# Patient Record
Sex: Female | Born: 1935 | Race: White | Hispanic: No | Smoking: Former smoker
Health system: Southern US, Community
[De-identification: ages and names within clinical notes are randomized; demographics above are authoritative.]

## PROBLEM LIST (undated history)

## (undated) DIAGNOSIS — K219 Gastro-esophageal reflux disease without esophagitis: Secondary | ICD-10-CM

## (undated) DIAGNOSIS — M1990 Unspecified osteoarthritis, unspecified site: Secondary | ICD-10-CM

## (undated) DIAGNOSIS — E7800 Pure hypercholesterolemia, unspecified: Secondary | ICD-10-CM

## (undated) DIAGNOSIS — I10 Essential (primary) hypertension: Secondary | ICD-10-CM

## (undated) DIAGNOSIS — H269 Unspecified cataract: Secondary | ICD-10-CM

## (undated) DIAGNOSIS — E039 Hypothyroidism, unspecified: Secondary | ICD-10-CM

## (undated) HISTORY — PX: COLONOSCOPY W/ POLYPECTOMY: SHX1380

## (undated) HISTORY — DX: Essential (primary) hypertension: I10

## (undated) HISTORY — PX: DILATION AND CURETTAGE OF UTERUS: SHX78

## (undated) HISTORY — PX: TONSILLECTOMY: SUR1361

## (undated) HISTORY — PX: BACK SURGERY: SHX140

---

## 1998-04-19 ENCOUNTER — Other Ambulatory Visit: Admission: RE | Admit: 1998-04-19 | Discharge: 1998-04-19 | Payer: Self-pay | Admitting: Obstetrics and Gynecology

## 1998-06-07 ENCOUNTER — Other Ambulatory Visit: Admission: RE | Admit: 1998-06-07 | Discharge: 1998-06-07 | Payer: Self-pay | Admitting: Obstetrics and Gynecology

## 1998-07-30 ENCOUNTER — Other Ambulatory Visit: Admission: RE | Admit: 1998-07-30 | Discharge: 1998-07-30 | Payer: Self-pay | Admitting: Obstetrics and Gynecology

## 1998-11-28 ENCOUNTER — Other Ambulatory Visit: Admission: RE | Admit: 1998-11-28 | Discharge: 1998-11-28 | Payer: Self-pay | Admitting: Obstetrics and Gynecology

## 1998-12-25 ENCOUNTER — Ambulatory Visit (HOSPITAL_COMMUNITY): Admission: RE | Admit: 1998-12-25 | Discharge: 1998-12-25 | Payer: Self-pay | Admitting: Obstetrics & Gynecology

## 1999-06-20 ENCOUNTER — Other Ambulatory Visit: Admission: RE | Admit: 1999-06-20 | Discharge: 1999-06-20 | Payer: Self-pay | Admitting: Obstetrics and Gynecology

## 1999-12-17 ENCOUNTER — Other Ambulatory Visit: Admission: RE | Admit: 1999-12-17 | Discharge: 1999-12-17 | Payer: Self-pay | Admitting: Obstetrics and Gynecology

## 2000-06-16 ENCOUNTER — Other Ambulatory Visit: Admission: RE | Admit: 2000-06-16 | Discharge: 2000-06-16 | Payer: Self-pay | Admitting: Obstetrics and Gynecology

## 2000-10-21 ENCOUNTER — Encounter (INDEPENDENT_AMBULATORY_CARE_PROVIDER_SITE_OTHER): Payer: Self-pay

## 2000-10-21 ENCOUNTER — Other Ambulatory Visit: Admission: RE | Admit: 2000-10-21 | Discharge: 2000-10-21 | Payer: Self-pay | Admitting: Obstetrics and Gynecology

## 2000-12-02 ENCOUNTER — Encounter (INDEPENDENT_AMBULATORY_CARE_PROVIDER_SITE_OTHER): Payer: Self-pay

## 2000-12-02 ENCOUNTER — Ambulatory Visit (HOSPITAL_COMMUNITY): Admission: RE | Admit: 2000-12-02 | Discharge: 2000-12-02 | Payer: Self-pay | Admitting: Obstetrics and Gynecology

## 2000-12-24 ENCOUNTER — Encounter: Admission: RE | Admit: 2000-12-24 | Discharge: 2000-12-24 | Payer: Self-pay | Admitting: Neurology

## 2000-12-24 ENCOUNTER — Encounter: Payer: Self-pay | Admitting: Neurology

## 2001-01-15 ENCOUNTER — Encounter: Admission: RE | Admit: 2001-01-15 | Discharge: 2001-04-15 | Payer: Self-pay | Admitting: Neurology

## 2001-06-28 ENCOUNTER — Other Ambulatory Visit: Admission: RE | Admit: 2001-06-28 | Discharge: 2001-06-28 | Payer: Self-pay | Admitting: Obstetrics and Gynecology

## 2002-11-28 ENCOUNTER — Other Ambulatory Visit: Admission: RE | Admit: 2002-11-28 | Discharge: 2002-11-28 | Payer: Self-pay | Admitting: Obstetrics and Gynecology

## 2003-03-08 ENCOUNTER — Encounter: Admission: RE | Admit: 2003-03-08 | Discharge: 2003-03-08 | Payer: Self-pay | Admitting: Internal Medicine

## 2003-03-08 ENCOUNTER — Encounter: Payer: Self-pay | Admitting: Internal Medicine

## 2004-01-11 ENCOUNTER — Other Ambulatory Visit: Admission: RE | Admit: 2004-01-11 | Discharge: 2004-01-11 | Payer: Self-pay | Admitting: Obstetrics and Gynecology

## 2006-07-22 ENCOUNTER — Encounter: Admission: RE | Admit: 2006-07-22 | Discharge: 2006-07-22 | Payer: Self-pay | Admitting: Internal Medicine

## 2007-10-21 ENCOUNTER — Ambulatory Visit (HOSPITAL_COMMUNITY): Admission: RE | Admit: 2007-10-21 | Discharge: 2007-10-21 | Payer: Self-pay | Admitting: Internal Medicine

## 2008-01-09 ENCOUNTER — Encounter: Admission: RE | Admit: 2008-01-09 | Discharge: 2008-01-09 | Payer: Self-pay | Admitting: Orthopedic Surgery

## 2009-06-04 ENCOUNTER — Encounter: Admission: RE | Admit: 2009-06-04 | Discharge: 2009-06-04 | Payer: Self-pay | Admitting: Orthopedic Surgery

## 2009-12-23 ENCOUNTER — Encounter: Admission: RE | Admit: 2009-12-23 | Discharge: 2009-12-23 | Payer: Self-pay | Admitting: *Deleted

## 2010-02-20 HISTORY — PX: ANTERIOR CERVICAL DECOMP/DISCECTOMY FUSION: SHX1161

## 2010-02-21 ENCOUNTER — Inpatient Hospital Stay (HOSPITAL_COMMUNITY): Admission: RE | Admit: 2010-02-21 | Discharge: 2010-02-22 | Payer: Self-pay | Admitting: Neurological Surgery

## 2010-04-22 HISTORY — PX: POSTERIOR LUMBAR FUSION: SHX6036

## 2010-05-02 ENCOUNTER — Ambulatory Visit (HOSPITAL_COMMUNITY): Admission: RE | Admit: 2010-05-02 | Discharge: 2010-05-02 | Payer: Self-pay | Admitting: Neurological Surgery

## 2010-05-21 ENCOUNTER — Inpatient Hospital Stay (HOSPITAL_COMMUNITY)
Admission: RE | Admit: 2010-05-21 | Discharge: 2010-05-28 | Payer: Self-pay | Source: Home / Self Care | Admitting: Neurological Surgery

## 2010-12-06 LAB — BASIC METABOLIC PANEL
BUN: 10 mg/dL (ref 6–23)
Calcium: 10.2 mg/dL (ref 8.4–10.5)
Calcium: 8.3 mg/dL — ABNORMAL LOW (ref 8.4–10.5)
Creatinine, Ser: 0.6 mg/dL (ref 0.4–1.2)
GFR calc Af Amer: >60 mL/min (ref >60)
GFR calc Af Amer: >60 mL/min (ref >60)
Glucose, Bld: 140 mg/dL — ABNORMAL HIGH (ref 70–99)
Potassium: 4.2 mEq/L (ref 3.5–5.1)
Sodium: 132 mEq/L — ABNORMAL LOW (ref 135–145)
Sodium: 135 mEq/L (ref 135–145)

## 2010-12-06 LAB — CBC
HCT: 40.1 % (ref 36.0–46.0)
Hemoglobin: 14.1 g/dL (ref 12.0–15.0)
MCH: 30.8 pg (ref 26.0–34.0)
MCHC: 35.2 g/dL (ref 30.0–36.0)
MCV: 87.4 fL (ref 78.0–100.0)
Platelets: 156 10*3/uL (ref 150–400)
Platelets: 203 10*3/uL (ref 150–400)
RDW: 12.4 % (ref 11.5–15.5)
RDW: 12.8 % (ref 11.5–15.5)

## 2010-12-06 LAB — TYPE AND SCREEN: ABO/RH(D): B POS

## 2010-12-06 LAB — VITAMIN D 25 HYDROXY (VIT D DEFICIENCY, FRACTURES): Vit D, 25-Hydroxy: 39 ng/mL (ref 30–89)

## 2010-12-06 LAB — ABO/RH: ABO/RH(D): B POS

## 2010-12-09 LAB — CBC
Hemoglobin: 13.9 g/dL (ref 12.0–15.0)
MCHC: 35.6 g/dL (ref 30.0–36.0)
RBC: 4.33 MIL/uL (ref 3.87–5.11)
WBC: 4.8 10*3/uL (ref 4.0–10.5)

## 2010-12-09 LAB — SURGICAL PCR SCREEN: MRSA, PCR: NEGATIVE

## 2011-02-06 ENCOUNTER — Other Ambulatory Visit (HOSPITAL_COMMUNITY): Payer: Self-pay | Admitting: Neurological Surgery

## 2011-02-06 DIAGNOSIS — M542 Cervicalgia: Secondary | ICD-10-CM

## 2011-02-06 DIAGNOSIS — M549 Dorsalgia, unspecified: Secondary | ICD-10-CM

## 2011-02-06 DIAGNOSIS — M546 Pain in thoracic spine: Secondary | ICD-10-CM

## 2011-02-07 NOTE — Op Note (Signed)
[facility]  Patient:    Carol Woods, Carol Woods                      MRN: [id number] Proc. Date: 12/02/00 Adm. Date:  [id number] Attending:  Morene Antu                           Operative Report  PREOPERATIVE DIAGNOSES:       1. Postmenopausal bleeding.                               2. Thickened endometrium.  POSTOPERATIVE DIAGNOSES:      1. Postmenopausal bleeding.                               2. Thickened endometrium.                               3. Submucosal fibroid.  PROCEDURE:                    Dilation, curettage and hysteroscopy with resectoscopic excision of submucosal fibroids.  SURGEON:                      Sherry A. Rosalio Macadamia, M.D.  ANESTHESIA:                   MAC.  INDICATIONS:                  This is a 77 year old G3, P2-0-1-2-1 who has been having postmenopausal bleeding daily. since changing her hormone replacement therapy.  After trying several different kinds of hormone replacement without success in stopping her bleeding, ultrasound was performed.  Ultrasound revealed small fibroids plus and thickened endometrium with a hyperechoic mass.  Vaginal mass was present measuring 2.4 x 2.0 x 2.0 cm, which has been followed for many years, consistent with an endometrioma.  because of the presence of the thickened endometrium, the patient is brought to the operating room for [facility] hysteroscopy and resectoscopic excision.  FINDINGS:                     Normal sized, anteflexed uterus.  submucosal fibroid present in the right cornua, approximately 1 cm, and an anterior submucosal fibroid present near the endocervical junction.  DESCRIPTION OF PROCEDURE:     The patient was brought into the operating room and given adequate IV sedation.  She was placed in the dorsal lithotomy position.  The perineum was washed with Betadine.  Pelvic examination was performed.  The bladder was in-and-out catheterized.  The surgeons gown  and gloves were changed.  The patient was draped in a sterile fashion.  A speculum was placed within the vagina.  The vagina was washed with Betadine. Paracervical block was administered with 1% Nesacaine.  The anterior lip of the cervix was grasped with a single tooth tenaculum.  The cervix was sounded. The cervix was dilated with Pratt dilators to a #31.  The hysteroscope was easily introduced into the endometrial cavity.  Pictures were obtained.  The submucosal fibroid was visualized at the right cornua.  Another submucosal fibroid area was present anteriorly  near the endocervical lower uterine segment junction.  Using a double loop  right angle resector, the submucosal fibroids were first excised and samples of the endometrium were taken circumferentially.  Adequate hemostasis was present.  Pictures were obtained. The hysteroscope was removed from the vagina.  The tenaculum was removed from the cervix.  The speculum was removed.  Repeat pelvic exam was performed, paying attention to the left adnexal region with the above findings.  The patient was taken out of the dorsal lithotomy position.  She was awakened. She was moved from the operating table to a stretcher in stable condition.  COMPLICATIONS:                None.  ESTIMATED BLOOD LOSS:         Less than 5 cc.  SORBITOL DIFFERENTIAL:        -80 cc. DD:  12/02/00 TD:  12/02/00 Job: [id number]

## 2011-02-21 ENCOUNTER — Ambulatory Visit (HOSPITAL_COMMUNITY)
Admission: RE | Admit: 2011-02-21 | Discharge: 2011-02-21 | Disposition: A | Discharge status: Home / Self Care | Payer: Medicare Other | Source: Ambulatory Visit | Attending: Neurological Surgery | Admitting: Neurological Surgery

## 2011-02-21 ENCOUNTER — Ambulatory Visit (HOSPITAL_COMMUNITY): Admission: RE | Admit: 2011-02-21 | Payer: Medicare Other | Source: Ambulatory Visit

## 2011-02-21 ENCOUNTER — Other Ambulatory Visit (HOSPITAL_COMMUNITY): Payer: Self-pay

## 2011-02-21 ENCOUNTER — Ambulatory Visit (HOSPITAL_COMMUNITY): Admission: RE | Admit: 2011-02-21 | Payer: Self-pay | Source: Ambulatory Visit

## 2011-02-21 ENCOUNTER — Other Ambulatory Visit (HOSPITAL_COMMUNITY): Payer: Self-pay | Admitting: Neurological Surgery

## 2011-02-21 DIAGNOSIS — M542 Cervicalgia: Secondary | ICD-10-CM

## 2011-02-21 DIAGNOSIS — M48061 Spinal stenosis, lumbar region without neurogenic claudication: Secondary | ICD-10-CM | POA: Insufficient documentation

## 2011-02-21 DIAGNOSIS — M549 Dorsalgia, unspecified: Secondary | ICD-10-CM

## 2011-02-21 DIAGNOSIS — M5144 Schmorl's nodes, thoracic region: Secondary | ICD-10-CM | POA: Insufficient documentation

## 2011-02-21 DIAGNOSIS — M5124 Other intervertebral disc displacement, thoracic region: Secondary | ICD-10-CM | POA: Insufficient documentation

## 2011-02-21 DIAGNOSIS — M47812 Spondylosis without myelopathy or radiculopathy, cervical region: Secondary | ICD-10-CM | POA: Insufficient documentation

## 2011-02-21 DIAGNOSIS — M546 Pain in thoracic spine: Secondary | ICD-10-CM

## 2011-02-21 MED ORDER — IOHEXOL 300 MG/ML  SOLN
10.0000 mL | Freq: Once | INTRAMUSCULAR | Status: AC | PRN
  Administered 2011-02-21: 10 mL via INTRATHECAL

## 2011-05-19 ENCOUNTER — Inpatient Hospital Stay (HOSPITAL_COMMUNITY): Admission: RE | Admit: 2011-05-19 | Payer: Medicare Other | Source: Ambulatory Visit | Admitting: Neurological Surgery

## 2014-05-24 ENCOUNTER — Other Ambulatory Visit: Payer: Self-pay | Admitting: Orthopedic Surgery

## 2014-06-08 ENCOUNTER — Inpatient Hospital Stay (HOSPITAL_COMMUNITY)
Admission: RE | Admit: 2014-06-08 | Payer: BC Managed Care – PPO | Source: Ambulatory Visit | Admitting: Orthopedic Surgery

## 2014-06-08 ENCOUNTER — Encounter (HOSPITAL_COMMUNITY): Admission: RE | Payer: Self-pay | Source: Ambulatory Visit

## 2014-06-08 SURGERY — ARTHROPLASTY, SHOULDER, TOTAL
Anesthesia: Choice | Laterality: Right

## 2014-08-16 ENCOUNTER — Other Ambulatory Visit: Payer: Self-pay | Admitting: Orthopedic Surgery

## 2014-08-21 NOTE — Pre-Procedure Instructions (Signed)
YAMILEX BORGWARDT  08/21/2014   Your procedure is scheduled on:  Thurs, Dec 10 @ 10:50 AM  Report to [facility]  at 8:50 AM.  Call this number if you have problems the morning of surgery: [phone]   Remember:   Do not eat food or drink liquids after midnight.   Take these medicines the morning of surgery with A SIP OF WATER: Synthroid                No Goody's,BC's,Aleve,Aspirin,Ibuprofen,Fish Oil,or any Herbal Medications   Do not wear jewelry, make-up or nail polish.  Do not wear lotions, powders, or perfumes.   Do not shave 48 hours prior to surgery.   Do not bring valuables to the hospital.  [facility] is not responsible  for any belongings or valuables.               Contacts, dentures or bridgework may not be worn into surgery.  Leave suitcase in the car. After surgery it may be brought to your room.  For patients admitted to the hospital, discharge time is determined by your   treatment team.               Patients discharged the day of surgery will not be allowed to drive home.    Special Instructions:  [place] - Preparing for Surgery  Before surgery, you can play an important role.  Because skin is not sterile, your skin needs to be as free of germs as possible.  You can reduce the number of germs on you skin by washing with CHG (chlorahexidine gluconate) soap before surgery.  CHG is an antiseptic cleaner which kills germs and bonds with the skin to continue killing germs even after washing.  Please DO NOT use if you have an allergy to CHG or antibacterial soaps.  If your skin becomes reddened/irritated stop using the CHG and inform your nurse when you arrive at Short Stay.  Do not shave (including legs and underarms) for at least 48 hours prior to the first CHG shower.  You may shave your face.  Please follow these instructions carefully:   1.  Shower with CHG Soap the night before surgery and the                                morning of  Surgery.  2.  If you choose to wash your hair, wash your hair first as usual with your       normal shampoo.  3.  After you shampoo, rinse your hair and body thoroughly to remove the                      Shampoo.  4.  Use CHG as you would any other liquid soap.  You can apply chg directly       to the skin and wash gently with scrungie or a clean washcloth.  5.  Apply the CHG Soap to your body ONLY FROM THE NECK DOWN.        Do not use on open wounds or open sores.  Avoid contact with your eyes,       ears, mouth and genitals (private parts).  Wash genitals (private parts)       with your normal soap.  6.  Wash thoroughly, paying special attention to the area where your surgery  will be performed.  7.  Thoroughly rinse your body with warm water from the neck down.  8.  DO NOT shower/wash with your normal soap after using and rinsing off       the CHG Soap.  9.  Pat yourself dry with a clean towel.            10.  Wear clean pajamas.            11.  Place clean sheets on your bed the night of your first shower and do not        sleep with pets.  Day of Surgery  Do not apply any lotions/deoderants the morning of surgery.  Please wear clean clothes to the hospital/surgery center.     Please read over the following fact sheets that you were given: Pain Booklet, Coughing and Deep Breathing, MRSA Information and Surgical Site Infection Prevention

## 2014-08-22 ENCOUNTER — Encounter (HOSPITAL_COMMUNITY): Payer: Self-pay

## 2014-08-22 ENCOUNTER — Encounter (HOSPITAL_COMMUNITY)
Admission: RE | Admit: 2014-08-22 | Discharge: 2014-08-22 | Disposition: A | Discharge status: Home / Self Care | Payer: Medicare Other | Source: Ambulatory Visit | Attending: Orthopedic Surgery | Admitting: Orthopedic Surgery

## 2014-08-22 DIAGNOSIS — Z01818 Encounter for other preprocedural examination: Secondary | ICD-10-CM | POA: Insufficient documentation

## 2014-08-22 DIAGNOSIS — I444 Left anterior fascicular block: Secondary | ICD-10-CM | POA: Diagnosis not present

## 2014-08-22 DIAGNOSIS — I10 Essential (primary) hypertension: Secondary | ICD-10-CM | POA: Diagnosis not present

## 2014-08-22 DIAGNOSIS — I4510 Unspecified right bundle-branch block: Secondary | ICD-10-CM | POA: Diagnosis not present

## 2014-08-22 DIAGNOSIS — E039 Hypothyroidism, unspecified: Secondary | ICD-10-CM | POA: Insufficient documentation

## 2014-08-22 DIAGNOSIS — I452 Bifascicular block: Secondary | ICD-10-CM | POA: Insufficient documentation

## 2014-08-22 DIAGNOSIS — I517 Cardiomegaly: Secondary | ICD-10-CM | POA: Insufficient documentation

## 2014-08-22 DIAGNOSIS — K219 Gastro-esophageal reflux disease without esophagitis: Secondary | ICD-10-CM | POA: Diagnosis not present

## 2014-08-22 HISTORY — DX: Essential (primary) hypertension: I10

## 2014-08-22 HISTORY — DX: Unspecified cataract: H26.9

## 2014-08-22 HISTORY — DX: Hypothyroidism, unspecified: E03.9

## 2014-08-22 HISTORY — DX: Unspecified osteoarthritis, unspecified site: M19.90

## 2014-08-22 HISTORY — DX: Gastro-esophageal reflux disease without esophagitis: K21.9

## 2014-08-22 LAB — TYPE AND SCREEN
ABO/RH(D): B POS
Antibody Screen: NEGATIVE

## 2014-08-22 LAB — URINALYSIS, ROUTINE W REFLEX MICROSCOPIC
BILIRUBIN URINE: NEGATIVE
GLUCOSE, UA: NEGATIVE mg/dL
Hgb urine dipstick: NEGATIVE
Ketones, ur: NEGATIVE mg/dL
NITRITE: NEGATIVE
PH: 6 (ref 5.0–8.0)
Protein, ur: NEGATIVE mg/dL
SPECIFIC GRAVITY, URINE: 1.021 (ref 1.005–1.030)
Urobilinogen, UA: 0.2 mg/dL (ref 0.0–1.0)

## 2014-08-22 LAB — CBC WITH DIFFERENTIAL/PLATELET
Basophils Absolute: 0 10*3/uL (ref 0.0–0.1)
Basophils Relative: 1 % (ref 0–1)
Eosinophils Absolute: 0.1 10*3/uL (ref 0.0–0.7)
Eosinophils Relative: 2 % (ref 0–5)
HEMATOCRIT: 39.2 % (ref 36.0–46.0)
HEMOGLOBIN: 13.4 g/dL (ref 12.0–15.0)
LYMPHS ABS: 1.5 10*3/uL (ref 0.7–4.0)
LYMPHS PCT: 31 % (ref 12–46)
MCH: 30.1 pg (ref 26.0–34.0)
MCHC: 34.2 g/dL (ref 30.0–36.0)
MCV: 88.1 fL (ref 78.0–100.0)
MONOS PCT: 9 % (ref 3–12)
Monocytes Absolute: 0.4 10*3/uL (ref 0.1–1.0)
NEUTROS ABS: 2.9 10*3/uL (ref 1.7–7.7)
Neutrophils Relative %: 57 % (ref 43–77)
Platelets: 205 10*3/uL (ref 150–400)
RBC: 4.45 MIL/uL (ref 3.87–5.11)
RDW: 12.5 % (ref 11.5–15.5)
WBC: 4.9 10*3/uL (ref 4.0–10.5)

## 2014-08-22 LAB — COMPREHENSIVE METABOLIC PANEL
ALT: 20 U/L (ref 0–35)
ANION GAP: 12 (ref 5–15)
AST: 18 U/L (ref 0–37)
Albumin: 3.9 g/dL (ref 3.5–5.2)
Alkaline Phosphatase: 60 U/L (ref 39–117)
BILIRUBIN TOTAL: 0.4 mg/dL (ref 0.3–1.2)
BUN: 15 mg/dL (ref 6–23)
CHLORIDE: 101 meq/L (ref 96–112)
CO2: 25 meq/L (ref 19–32)
CREATININE: 0.57 mg/dL (ref 0.50–1.10)
Calcium: 9.5 mg/dL (ref 8.4–10.5)
GFR, EST NON AFRICAN AMERICAN: 87 mL/min — AB (ref >90)
GLUCOSE: 106 mg/dL — AB (ref 70–99)
Potassium: 4.2 mEq/L (ref 3.7–5.3)
Sodium: 138 mEq/L (ref 137–147)
Total Protein: 7.4 g/dL (ref 6.0–8.3)

## 2014-08-22 LAB — PROTIME-INR
INR: 1.04 (ref 0.00–1.49)
PROTHROMBIN TIME: 13.7 s (ref 11.6–15.2)

## 2014-08-22 LAB — SURGICAL PCR SCREEN
MRSA, PCR: NEGATIVE
Staphylococcus aureus: NEGATIVE

## 2014-08-22 LAB — URINE MICROSCOPIC-ADD ON

## 2014-08-22 LAB — APTT: APTT: 32 s (ref 24–37)

## 2014-08-22 NOTE — Progress Notes (Signed)
Talked to Dr Keane Police nurse about abnormal EKG done this AM at patient's PAT appointment for surgery. Requested a comparison EKG , CXR, and last office visit notes for anesthesia review. Faxed EKG done today ,   nurse stated she would show EKG to Dr Virgina Jock, EKG- NSR possible left atrial enlargement, RBBB,left anterior fascicular, Bifascicular block, left ventricular hypertrophy, cannot rule out septal infart.Nurse also stated they would call patient.

## 2014-08-22 NOTE — Progress Notes (Signed)
Anesthesia Chart Review:  Pt is 80 year old female scheduled for R total shoulder arthroplasty on 08/31/2014 with Dr. Tamera Punt.   PMH: HTN, hypothyroidism, GERD  Medications include: ASA, lisinopril, levothyroxine, brimonidine  Preoperative labs reviewed.    EKG: NSR. Possible L atrial enlargement. RBBB. LAFB. LVH. Cannot rule out septal infarct, age undetermined. Since last EKG 2011, Q waves V1, V2.   Nuclear stress test 11/25/2007: Normal stress test.   Per last office visit note from Dr. Virgina Jock, pcp, he is aware of surgery.   Discussed case with Dr. Linna Caprice.   If no changes, I anticipate pt can proceed with surgery as scheduled.   Willeen Cass, FNP-BC [facility] Short Stay Surgical Center/Anesthesiology Phone: [phone] 08/22/2014 4:50 PM

## 2014-08-28 ENCOUNTER — Other Ambulatory Visit (HOSPITAL_COMMUNITY): Payer: BC Managed Care – PPO

## 2014-08-30 MED ORDER — CEFAZOLIN SODIUM-DEXTROSE 2-3 GM-% IV SOLR
2.0000 g | INTRAVENOUS | Status: AC
  Administered 2014-08-31: 2 g via INTRAVENOUS
  Filled 2014-08-30: qty 50

## 2014-08-30 MED ORDER — POVIDONE-IODINE 7.5 % EX SOLN
Freq: Once | CUTANEOUS | Status: DC
  Filled 2014-08-30: qty 118

## 2014-08-31 ENCOUNTER — Inpatient Hospital Stay (HOSPITAL_COMMUNITY)
Admission: RE | Admit: 2014-08-31 | Discharge: 2014-09-01 | DRG: 483 | Disposition: A | Discharge status: Home Health | Payer: Medicare Other | Source: Ambulatory Visit | Attending: Orthopedic Surgery | Admitting: Orthopedic Surgery

## 2014-08-31 ENCOUNTER — Inpatient Hospital Stay (HOSPITAL_COMMUNITY): Payer: Medicare Other | Admitting: Certified Registered Nurse Anesthetist

## 2014-08-31 ENCOUNTER — Inpatient Hospital Stay (HOSPITAL_COMMUNITY): Payer: Medicare Other

## 2014-08-31 ENCOUNTER — Encounter (HOSPITAL_COMMUNITY)
Admission: RE | Disposition: A | Discharge status: Home Health | Payer: Self-pay | Source: Ambulatory Visit | Attending: Orthopedic Surgery

## 2014-08-31 ENCOUNTER — Encounter (HOSPITAL_COMMUNITY): Payer: Self-pay | Admitting: Certified Registered Nurse Anesthetist

## 2014-08-31 ENCOUNTER — Inpatient Hospital Stay (HOSPITAL_COMMUNITY): Payer: Medicare Other | Admitting: Emergency Medicine

## 2014-08-31 DIAGNOSIS — I10 Essential (primary) hypertension: Secondary | ICD-10-CM | POA: Diagnosis present

## 2014-08-31 DIAGNOSIS — M19011 Primary osteoarthritis, right shoulder: Secondary | ICD-10-CM | POA: Diagnosis present

## 2014-08-31 DIAGNOSIS — E039 Hypothyroidism, unspecified: Secondary | ICD-10-CM | POA: Diagnosis present

## 2014-08-31 DIAGNOSIS — Z01818 Encounter for other preprocedural examination: Secondary | ICD-10-CM

## 2014-08-31 DIAGNOSIS — K219 Gastro-esophageal reflux disease without esophagitis: Secondary | ICD-10-CM | POA: Diagnosis present

## 2014-08-31 DIAGNOSIS — Z87891 Personal history of nicotine dependence: Secondary | ICD-10-CM

## 2014-08-31 DIAGNOSIS — Z79899 Other long term (current) drug therapy: Secondary | ICD-10-CM

## 2014-08-31 DIAGNOSIS — D62 Acute posthemorrhagic anemia: Secondary | ICD-10-CM | POA: Diagnosis not present

## 2014-08-31 DIAGNOSIS — Z96611 Presence of right artificial shoulder joint: Secondary | ICD-10-CM

## 2014-08-31 DIAGNOSIS — M25511 Pain in right shoulder: Secondary | ICD-10-CM | POA: Diagnosis present

## 2014-08-31 DIAGNOSIS — Z7982 Long term (current) use of aspirin: Secondary | ICD-10-CM

## 2014-08-31 DIAGNOSIS — E780 Pure hypercholesterolemia: Secondary | ICD-10-CM | POA: Diagnosis present

## 2014-08-31 DIAGNOSIS — Z96619 Presence of unspecified artificial shoulder joint: Secondary | ICD-10-CM

## 2014-08-31 HISTORY — PX: TOTAL SHOULDER ARTHROPLASTY: SHX126

## 2014-08-31 HISTORY — DX: Pure hypercholesterolemia, unspecified: E78.00

## 2014-08-31 SURGERY — ARTHROPLASTY, SHOULDER, TOTAL
Anesthesia: Regional | Site: Shoulder | Laterality: Right

## 2014-08-31 MED ORDER — GLYCOPYRROLATE 0.2 MG/ML IJ SOLN
INTRAMUSCULAR | Status: DC | PRN
  Administered 2014-08-31: 0.4 mg via INTRAVENOUS

## 2014-08-31 MED ORDER — ONDANSETRON HCL 4 MG/2ML IJ SOLN
INTRAMUSCULAR | Status: DC | PRN
  Administered 2014-08-31: 4 mg via INTRAVENOUS

## 2014-08-31 MED ORDER — PROPOFOL 10 MG/ML IV BOLUS
INTRAVENOUS | Status: AC
  Filled 2014-08-31: qty 20

## 2014-08-31 MED ORDER — OXYCODONE-ACETAMINOPHEN 5-325 MG PO TABS
1.0000 | ORAL_TABLET | ORAL | Status: DC | PRN
  Administered 2014-09-01: 2 via ORAL
  Filled 2014-08-31: qty 2

## 2014-08-31 MED ORDER — FENTANYL CITRATE 0.05 MG/ML IJ SOLN
INTRAMUSCULAR | Status: AC
  Filled 2014-08-31: qty 2

## 2014-08-31 MED ORDER — DIPHENHYDRAMINE HCL 12.5 MG/5ML PO ELIX: 12.5000 mg | ORAL_SOLUTION | ORAL | Status: DC | PRN

## 2014-08-31 MED ORDER — LISINOPRIL 20 MG PO TABS
20.0000 mg | ORAL_TABLET | Freq: Every day | ORAL | Status: DC
  Administered 2014-08-31 – 2014-09-01 (×2): 20 mg via ORAL
  Filled 2014-08-31 (×2): qty 1

## 2014-08-31 MED ORDER — FENTANYL CITRATE 0.05 MG/ML IJ SOLN
INTRAMUSCULAR | Status: AC
  Administered 2014-08-31: 50 ug via INTRAVENOUS
  Filled 2014-08-31: qty 2

## 2014-08-31 MED ORDER — NEOSTIGMINE METHYLSULFATE 10 MG/10ML IV SOLN
INTRAVENOUS | Status: AC
  Filled 2014-08-31: qty 1

## 2014-08-31 MED ORDER — ALUMINUM HYDROXIDE GEL 320 MG/5ML PO SUSP
15.0000 mL | ORAL | Status: DC | PRN
  Filled 2014-08-31: qty 30

## 2014-08-31 MED ORDER — ONDANSETRON HCL 4 MG/2ML IJ SOLN
INTRAMUSCULAR | Status: AC
  Filled 2014-08-31: qty 2

## 2014-08-31 MED ORDER — ONDANSETRON HCL 4 MG/2ML IJ SOLN: 4.0000 mg | Freq: Once | INTRAMUSCULAR | Status: DC | PRN

## 2014-08-31 MED ORDER — MIDAZOLAM HCL 2 MG/2ML IJ SOLN
INTRAMUSCULAR | Status: AC
  Administered 2014-08-31: 2 mg via INTRAVENOUS
  Filled 2014-08-31: qty 2

## 2014-08-31 MED ORDER — ACETAMINOPHEN 650 MG RE SUPP: 650.0000 mg | Freq: Four times a day (QID) | RECTAL | Status: DC | PRN

## 2014-08-31 MED ORDER — EPHEDRINE SULFATE 50 MG/ML IJ SOLN
INTRAMUSCULAR | Status: DC | PRN
  Administered 2014-08-31 (×2): 5 mg via INTRAVENOUS

## 2014-08-31 MED ORDER — BRIMONIDINE TARTRATE 0.2 % OP SOLN
1.0000 [drp] | Freq: Two times a day (BID) | OPHTHALMIC | Status: DC
  Administered 2014-08-31 – 2014-09-01 (×2): 1 [drp] via OPHTHALMIC
  Filled 2014-08-31: qty 5

## 2014-08-31 MED ORDER — OXYCODONE HCL 5 MG/5ML PO SOLN: 5.0000 mg | Freq: Once | ORAL | Status: DC | PRN

## 2014-08-31 MED ORDER — MENTHOL 3 MG MT LOZG: 1.0000 | LOZENGE | OROMUCOSAL | Status: DC | PRN

## 2014-08-31 MED ORDER — LACTATED RINGERS IV SOLN
INTRAVENOUS | Status: DC | PRN
  Administered 2014-08-31 (×2): via INTRAVENOUS

## 2014-08-31 MED ORDER — DOCUSATE SODIUM 100 MG PO CAPS
100.0000 mg | ORAL_CAPSULE | Freq: Two times a day (BID) | ORAL | Status: DC
  Administered 2014-08-31 – 2014-09-01 (×2): 100 mg via ORAL
  Filled 2014-08-31 (×2): qty 1

## 2014-08-31 MED ORDER — ROCURONIUM BROMIDE 100 MG/10ML IV SOLN
INTRAVENOUS | Status: DC | PRN
  Administered 2014-08-31: 50 mg via INTRAVENOUS

## 2014-08-31 MED ORDER — BISACODYL 10 MG RE SUPP: 10.0000 mg | Freq: Every day | RECTAL | Status: DC | PRN

## 2014-08-31 MED ORDER — PHENOL 1.4 % MT LIQD: 1.0000 | OROMUCOSAL | Status: DC | PRN

## 2014-08-31 MED ORDER — ARTIFICIAL TEARS OP OINT
TOPICAL_OINTMENT | OPHTHALMIC | Status: AC
  Filled 2014-08-31: qty 3.5

## 2014-08-31 MED ORDER — ASPIRIN EC 325 MG PO TBEC
325.0000 mg | DELAYED_RELEASE_TABLET | Freq: Two times a day (BID) | ORAL | Status: DC
  Administered 2014-08-31 – 2014-09-01 (×2): 325 mg via ORAL
  Filled 2014-08-31 (×5): qty 1

## 2014-08-31 MED ORDER — ONDANSETRON HCL 4 MG/2ML IJ SOLN: 4.0000 mg | Freq: Four times a day (QID) | INTRAMUSCULAR | Status: DC | PRN

## 2014-08-31 MED ORDER — FENTANYL CITRATE 0.05 MG/ML IJ SOLN
50.0000 ug | INTRAMUSCULAR | Status: DC | PRN
  Administered 2014-08-31: 50 ug via INTRAVENOUS

## 2014-08-31 MED ORDER — HYDROCODONE-ACETAMINOPHEN 5-325 MG PO TABS
1.0000 | ORAL_TABLET | ORAL | Status: DC | PRN
  Administered 2014-09-01: 2 via ORAL
  Filled 2014-08-31: qty 2

## 2014-08-31 MED ORDER — SODIUM CHLORIDE 0.9 % IJ SOLN
INTRAMUSCULAR | Status: DC | PRN
  Administered 2014-08-31: 20 mL

## 2014-08-31 MED ORDER — CEFAZOLIN SODIUM 1-5 GM-% IV SOLN
1.0000 g | Freq: Four times a day (QID) | INTRAVENOUS | Status: AC
  Administered 2014-08-31 – 2014-09-01 (×3): 1 g via INTRAVENOUS
  Filled 2014-08-31 (×3): qty 50

## 2014-08-31 MED ORDER — METOCLOPRAMIDE HCL 5 MG/ML IJ SOLN: 5.0000 mg | Freq: Three times a day (TID) | INTRAMUSCULAR | Status: DC | PRN

## 2014-08-31 MED ORDER — SODIUM CHLORIDE 0.9 % IR SOLN
Status: DC | PRN
  Administered 2014-08-31: 1000 mL

## 2014-08-31 MED ORDER — DEXAMETHASONE SODIUM PHOSPHATE 4 MG/ML IJ SOLN
INTRAMUSCULAR | Status: DC | PRN
  Administered 2014-08-31: 8 mg via INTRAVENOUS

## 2014-08-31 MED ORDER — HYDROMORPHONE HCL 1 MG/ML IJ SOLN: 0.2500 mg | INTRAMUSCULAR | Status: DC | PRN

## 2014-08-31 MED ORDER — MIDAZOLAM HCL 2 MG/2ML IJ SOLN
1.0000 mg | INTRAMUSCULAR | Status: DC | PRN
  Administered 2014-08-31: 2 mg via INTRAVENOUS

## 2014-08-31 MED ORDER — LEVOTHYROXINE SODIUM 125 MCG PO TABS
125.0000 ug | ORAL_TABLET | Freq: Every day | ORAL | Status: DC
  Administered 2014-09-01: 125 ug via ORAL
  Filled 2014-08-31 (×2): qty 1

## 2014-08-31 MED ORDER — MIDAZOLAM HCL 2 MG/2ML IJ SOLN
INTRAMUSCULAR | Status: AC
  Filled 2014-08-31: qty 2

## 2014-08-31 MED ORDER — METRONIDAZOLE 0.75 % EX GEL
1.0000 "application " | Freq: Every day | CUTANEOUS | Status: DC
  Administered 2014-08-31 – 2014-09-01 (×2): 1 via TOPICAL
  Filled 2014-08-31: qty 45

## 2014-08-31 MED ORDER — OXYCODONE HCL 5 MG PO TABS: 5.0000 mg | ORAL_TABLET | ORAL | Status: DC | PRN

## 2014-08-31 MED ORDER — PHENYLEPHRINE HCL 10 MG/ML IJ SOLN
10.0000 mg | INTRAVENOUS | Status: DC | PRN
  Administered 2014-08-31: 20 ug/min via INTRAVENOUS

## 2014-08-31 MED ORDER — PROPOFOL 10 MG/ML IV BOLUS
INTRAVENOUS | Status: DC | PRN
  Administered 2014-08-31: 120 mg via INTRAVENOUS

## 2014-08-31 MED ORDER — SODIUM CHLORIDE 0.9 % IV SOLN
INTRAVENOUS | Status: DC
  Administered 2014-08-31: 23:00:00 via INTRAVENOUS

## 2014-08-31 MED ORDER — FLEET ENEMA 7-19 GM/118ML RE ENEM: 1.0000 | ENEMA | Freq: Once | RECTAL | Status: AC | PRN

## 2014-08-31 MED ORDER — ARTIFICIAL TEARS OP OINT
TOPICAL_OINTMENT | OPHTHALMIC | Status: DC | PRN
  Administered 2014-08-31: 1 via OPHTHALMIC

## 2014-08-31 MED ORDER — BUPIVACAINE LIPOSOME 1.3 % IJ SUSP
20.0000 mL | Freq: Once | INTRAMUSCULAR | Status: AC
  Administered 2014-08-31: 20 mL
  Filled 2014-08-31: qty 20

## 2014-08-31 MED ORDER — GLYCOPYRROLATE 0.2 MG/ML IJ SOLN
INTRAMUSCULAR | Status: AC
  Filled 2014-08-31: qty 2

## 2014-08-31 MED ORDER — MORPHINE SULFATE 2 MG/ML IJ SOLN: 1.0000 mg | INTRAMUSCULAR | Status: DC | PRN

## 2014-08-31 MED ORDER — ROCURONIUM BROMIDE 50 MG/5ML IV SOLN
INTRAVENOUS | Status: AC
  Filled 2014-08-31: qty 1

## 2014-08-31 MED ORDER — POLYETHYLENE GLYCOL 3350 17 G PO PACK: 17.0000 g | PACK | Freq: Every day | ORAL | Status: DC | PRN

## 2014-08-31 MED ORDER — OXYCODONE-ACETAMINOPHEN 5-325 MG PO TABS: 1.0000 | ORAL_TABLET | ORAL | Status: DC | PRN

## 2014-08-31 MED ORDER — METOCLOPRAMIDE HCL 10 MG PO TABS: 5.0000 mg | ORAL_TABLET | Freq: Three times a day (TID) | ORAL | Status: DC | PRN

## 2014-08-31 MED ORDER — LIDOCAINE HCL (CARDIAC) 20 MG/ML IV SOLN
INTRAVENOUS | Status: DC | PRN
  Administered 2014-08-31: 100 mg via INTRAVENOUS

## 2014-08-31 MED ORDER — FENTANYL CITRATE 0.05 MG/ML IJ SOLN
INTRAMUSCULAR | Status: AC
Start: 2014-08-31 — End: 2014-08-31
  Filled 2014-08-31: qty 5

## 2014-08-31 MED ORDER — DEXAMETHASONE SODIUM PHOSPHATE 4 MG/ML IJ SOLN
INTRAMUSCULAR | Status: AC
  Filled 2014-08-31: qty 2

## 2014-08-31 MED ORDER — LIDOCAINE HCL (CARDIAC) 20 MG/ML IV SOLN
INTRAVENOUS | Status: AC
  Filled 2014-08-31: qty 5

## 2014-08-31 MED ORDER — MEPERIDINE HCL 25 MG/ML IJ SOLN: 6.2500 mg | INTRAMUSCULAR | Status: DC | PRN

## 2014-08-31 MED ORDER — OXYCODONE HCL 5 MG PO TABS: 5.0000 mg | ORAL_TABLET | Freq: Once | ORAL | Status: DC | PRN

## 2014-08-31 MED ORDER — LACTATED RINGERS IV SOLN
INTRAVENOUS | Status: DC
  Administered 2014-08-31: 10:00:00 via INTRAVENOUS

## 2014-08-31 MED ORDER — ONDANSETRON HCL 4 MG PO TABS: 4.0000 mg | ORAL_TABLET | Freq: Four times a day (QID) | ORAL | Status: DC | PRN

## 2014-08-31 MED ORDER — NEOSTIGMINE METHYLSULFATE 10 MG/10ML IV SOLN
INTRAVENOUS | Status: DC | PRN
  Administered 2014-08-31: 3 mg via INTRAVENOUS

## 2014-08-31 MED ORDER — FENTANYL CITRATE 0.05 MG/ML IJ SOLN
INTRAMUSCULAR | Status: DC | PRN
  Administered 2014-08-31: 100 ug via INTRAVENOUS

## 2014-08-31 MED ORDER — DOCUSATE SODIUM 100 MG PO CAPS: 100.0000 mg | ORAL_CAPSULE | Freq: Three times a day (TID) | ORAL | Status: DC | PRN

## 2014-08-31 MED ORDER — ACETAMINOPHEN 325 MG PO TABS: 650.0000 mg | ORAL_TABLET | Freq: Four times a day (QID) | ORAL | Status: DC | PRN

## 2014-08-31 SURGICAL SUPPLY — 69 items
BIT DRILL 5/64X5 DISP (BIT) ×1 IMPLANT
BLADE SAW SAG 73X25 THK (BLADE) ×1
BLADE SAW SGTL 73X25 THK (BLADE) ×1 IMPLANT
BLADE SURG 15 STRL LF DISP TIS (BLADE) ×1 IMPLANT
BLADE SURG 15 STRL SS (BLADE) ×2
CAPT CONVERTIBLE SHOULDER TSA (Capitated) ×1 IMPLANT
CEMENT BONE DEPUY (Cement) ×1 IMPLANT
CHLORAPREP W/TINT 26ML (MISCELLANEOUS) ×2 IMPLANT
CLSR STERI-STRIP ANTIMIC 1/2X4 (GAUZE/BANDAGES/DRESSINGS) ×1 IMPLANT
COVER MAYO STAND STRL (DRAPES) ×2 IMPLANT
COVER SURGICAL LIGHT HANDLE (MISCELLANEOUS) ×2 IMPLANT
DRAPE IMP U-DRAPE 54X76 (DRAPES) ×2 IMPLANT
DRAPE INCISE IOBAN 66X45 STRL (DRAPES) ×4 IMPLANT
DRAPE ORTHO SPLIT 77X108 STRL (DRAPES) ×4
DRAPE SURG 17X23 STRL (DRAPES) ×2 IMPLANT
DRAPE SURG ORHT 6 SPLT 77X108 (DRAPES) ×2 IMPLANT
DRAPE U-SHAPE 47X51 STRL (DRAPES) ×2 IMPLANT
DRSG AQUACEL AG ADV 3.5X10 (GAUZE/BANDAGES/DRESSINGS) ×1 IMPLANT
ELECT BLADE 4.0 EZ CLEAN MEGAD (MISCELLANEOUS)
ELECT REM PT RETURN 9FT ADLT (ELECTROSURGICAL) ×2
ELECTRODE BLDE 4.0 EZ CLN MEGD (MISCELLANEOUS) IMPLANT
ELECTRODE REM PT RTRN 9FT ADLT (ELECTROSURGICAL) ×1 IMPLANT
EVACUATOR 1/8 PVC DRAIN (DRAIN) IMPLANT
GLOVE BIO SURGEON STRL SZ7 (GLOVE) ×2 IMPLANT
GLOVE BIO SURGEON STRL SZ7.5 (GLOVE) ×2 IMPLANT
GLOVE BIOGEL PI IND STRL 7.0 (GLOVE) ×1 IMPLANT
GLOVE BIOGEL PI IND STRL 8 (GLOVE) ×1 IMPLANT
GLOVE BIOGEL PI INDICATOR 7.0 (GLOVE) ×1
GLOVE BIOGEL PI INDICATOR 8 (GLOVE) ×1
GOWN STRL REUS W/ TWL LRG LVL3 (GOWN DISPOSABLE) ×1 IMPLANT
GOWN STRL REUS W/ TWL XL LVL3 (GOWN DISPOSABLE) ×1 IMPLANT
GOWN STRL REUS W/TWL LRG LVL3 (GOWN DISPOSABLE) ×2
GOWN STRL REUS W/TWL XL LVL3 (GOWN DISPOSABLE) ×2
HANDPIECE INTERPULSE COAX TIP (DISPOSABLE) ×2
HEMOSTAT SURGICEL 2X14 (HEMOSTASIS) ×2 IMPLANT
HOOD PEEL AWAY FACE SHEILD DIS (HOOD) ×4 IMPLANT
KIT BASIN OR (CUSTOM PROCEDURE TRAY) ×2 IMPLANT
KIT ROOM TURNOVER OR (KITS) ×2 IMPLANT
MANIFOLD NEPTUNE II (INSTRUMENTS) ×2 IMPLANT
NDL HYPO 25GX1X1/2 BEV (NEEDLE) IMPLANT
NDL MAYO TROCAR (NEEDLE) ×1 IMPLANT
NEEDLE HYPO 25GX1X1/2 BEV (NEEDLE) ×2 IMPLANT
NEEDLE MAYO TROCAR (NEEDLE) ×2 IMPLANT
NS IRRIG 1000ML POUR BTL (IV SOLUTION) ×2 IMPLANT
PACK SHOULDER (CUSTOM PROCEDURE TRAY) ×2 IMPLANT
PAD ARMBOARD 7.5X6 YLW CONV (MISCELLANEOUS) ×4 IMPLANT
PIN GUIDE 2.5X200 (PIN) IMPLANT
RETRIEVER SUT HEWSON (MISCELLANEOUS) ×2 IMPLANT
SET HNDPC FAN SPRY TIP SCT (DISPOSABLE) ×1 IMPLANT
SLING ARM IMMOBILIZER LRG (SOFTGOODS) ×2 IMPLANT
SLING ARM IMMOBILIZER MED (SOFTGOODS) IMPLANT
SMARTMIX MINI TOWER (MISCELLANEOUS) ×2
SPONGE LAP 18X18 X RAY DECT (DISPOSABLE) ×2 IMPLANT
SPONGE LAP 4X18 X RAY DECT (DISPOSABLE) IMPLANT
STRIP CLOSURE SKIN 1/2X4 (GAUZE/BANDAGES/DRESSINGS) ×2 IMPLANT
SUCTION FRAZIER TIP 10 FR DISP (SUCTIONS) ×2 IMPLANT
SUPPORT WRAP ARM LG (MISCELLANEOUS) ×2 IMPLANT
SUT ETHIBOND NAB CT1 #1 30IN (SUTURE) ×2 IMPLANT
SUT MNCRL AB 4-0 PS2 18 (SUTURE) ×2 IMPLANT
SUT SILK 2 0 TIES 17X18 (SUTURE)
SUT SILK 2-0 18XBRD TIE BLK (SUTURE) IMPLANT
SUT VIC AB 0 CTB1 27 (SUTURE) IMPLANT
SUT VIC AB 2-0 CT1 27 (SUTURE) ×2
SUT VIC AB 2-0 CT1 TAPERPNT 27 (SUTURE) ×1 IMPLANT
SYR CONTROL 10ML LL (SYRINGE) ×1 IMPLANT
TAPE FIBER 2MM 7IN #2 BLUE (SUTURE) ×6 IMPLANT
TOWEL OR 17X24 6PK STRL BLUE (TOWEL DISPOSABLE) ×2 IMPLANT
TOWEL OR 17X26 10 PK STRL BLUE (TOWEL DISPOSABLE) ×2 IMPLANT
TOWER SMARTMIX MINI (MISCELLANEOUS) ×1 IMPLANT

## 2014-08-31 NOTE — Progress Notes (Signed)
Report given to sharon rn as caregiver 

## 2014-08-31 NOTE — Anesthesia Preprocedure Evaluation (Addendum)
Anesthesia Evaluation  Patient identified by MRN, date of birth, ID band Patient awake    Reviewed: Allergy & Precautions, H&P , NPO status , Patient's Chart, lab work & pertinent test results  Airway Mallampati: I  TM Distance: >3 FB Neck ROM: Full    Dental  (+) Dental Advisory Given   Pulmonary former smoker,          Cardiovascular hypertension, Pt. on medications     Neuro/Psych    GI/Hepatic GERD-  Medicated,  Endo/Other  Hypothyroidism   Renal/GU      Musculoskeletal  (+) Arthritis -,   Abdominal   Peds  Hematology   Anesthesia Other Findings   Reproductive/Obstetrics                           Anesthesia Physical Anesthesia Plan  ASA: II  Anesthesia Plan: General and Regional   Post-op Pain Management:    Induction: Intravenous  Airway Management Planned: Oral ETT  Additional Equipment:   Intra-op Plan:   Post-operative Plan: Extubation in OR  Informed Consent: I have reviewed the patients History and Physical, chart, labs and discussed the procedure including the risks, benefits and alternatives for the proposed anesthesia with the patient or authorized representative who has indicated his/her understanding and acceptance.   Dental advisory given  Plan Discussed with: CRNA and Surgeon  Anesthesia Plan Comments:       Anesthesia Quick Evaluation

## 2014-08-31 NOTE — Discharge Instructions (Signed)

## 2014-08-31 NOTE — Op Note (Signed)
Procedure(s): TOTAL SHOULDER ARTHROPLASTY Procedure Note  BATINA DOUGAN female 80 y.o. 08/31/2014  Procedure(s) and Anesthesia Type:    * RIGHT TOTAL SHOULDER ARTHROPLASTY - Choice  Surgeon(s) and Role:    * Nita Sells, MD - Primary   Indications:  80 y.o. female  With endstage right shoulder arthritis. Pain and dysfunction interfered with quality of life and nonoperative treatment with activity modification, NSAIDS and injections failed.     Surgeon: Nita Sells   Assistants: Jeanmarie Hubert PA-C [facility] was present and scrubbed throughout the procedure and was essential in positioning, retraction, exposure, and closure)  Anesthesia: General endotracheal anesthesia with preoperative interscalene block given by the attending anesthesiologist    Procedure Detail  TOTAL SHOULDER ARTHROPLASTY  Findings: Tornier Flex size 4 A stem with a 43 low offset head.  Perform glenoid, small 35 cemented a lesser tuberosity osteotomy was performed and repaired at the conclusion of the procedure. Preoperative external rotation was about 10, postoperatively, 50.   Estimated Blood Loss:  200 mL         Drains: None   Blood Given: none          Specimens: none        Complications:  * No complications entered in OR log *         Disposition: PACU - hemodynamically stable.         Condition: stable    Procedure:   The patient was identified in the preoperative holding area where I personally marked the operative extremity after verifying with the patient and consent. She  was taken to the operating room where She was transferred to the   operative table.  The patient received an interscalene block in   the holding area by the attending anesthesiologist.  General anesthesia was induced   in the operating room without complication.  The patient did receive IV  Ancef prior to the commencement of the procedure.  The patient was   placed in the  beach-chair position with the back raised about 30   degrees.  The nonoperative extremity and head and neck were carefully   positioned and padded protecting against neurovascular compromise.  The   left upper extremity was then prepped and draped in the standard sterile   fashion.    The appropriate operative time-out was performed with   Anesthesia, the perioperative staff, as well as myself and we all agreed   that the right side was the correct operative site.  An approximately   10 cm incision was made from the tip of the coracoid to the center point of the   humerus at the level of the axilla.  Dissection was carried down sharply   through subcutaneous tissues and cephalic vein was identified and taken   laterally with the deltoid.  The pectoralis major was taken medially.  The   upper 1 cm of the pectoralis major was released from its attachment on   the humerus.  The clavipectoral fascia was incised just lateral to the   conjoined tendon.  This incision was carried up to but not into the   coracoacromial ligament.  Digital palpation was used to prove   integrity of the axillary nerve which was protected throughout the   procedure.  Musculocutaneous nerve was not palpated in the operative   field.  Conjoined tendon was then retracted gently medially and the   deltoid laterally.  Anterior circumflex humeral vessels were clamped and   coagulated.  The soft tissues overlying the biceps was incised and this   incision was carried across the transverse humeral ligament to the base   of the coracoid.  The biceps was tenodesed to the soft tissue just above   pectoralis major and the remaining portion of the biceps superiorly was   excised.  An osteotomy was performed at the lesser tuberosity.  Capsule was then   released all the way down to the 6 o'clock position of the humeral head.   The humeral head was then delivered with simultaneous adduction,   extension and external rotation.  All  humeral osteophytes were removed   and the anatomic neck of the humerus was marked and cut free hand at   approximately 25 degrees retroversion within about 3 mm of the cuff   reflection posteriorly.  The head size was estimated to be a 43.  At that point, the humeral head was retracted posteriorly with   a Fukuda retractor.   Remaining portion of the capsule was released at the base of the   coracoid.  The remaining biceps anchor and the entire anterior-inferior   labrum was excised.  The posterior labrum was also excised but the   posterior capsule was not released.  The guidepin was placed bicortically with +0 elevated guide.   the concavity of the glenoid was felt to be a 35.  The reamer was used to ream to concentric bone with punctate bleeding.  This gave an excellent concentric surface.  The center hole was then drilled for an anchor peg glenoid followed by the three peripheral holes and none of the holes   exited the glenoid wall.  I then pulse irrigated these holes and dried   them with Surgicel.  The three peripheral holes were then   pressurized cemented and the anchor peg glenoid was placed and impacted   with an excellent fit.  The glenoid was a small component.  The proximal humerus was then again exposed taking care not to displace the glenoid.   The entry reamer was used open the canal and then sounding reamers were used to verify distal stem size. Proximal broaches were then used and the size 4 broach was felt to have the best press-fit. The trial head was placed with a 43 low offset head. The joint was then reduced.    With the trial implantation of the component, there was   approximately 50% posterior translation with immediate snap back to the   anatomic position.  With forward elevation, there was no tendency   towards posterior subluxation.   The trial was removed and the final implant was prepared on a back table.  The trial was removed and the final implant was prepared on a  back table.   Small holes were drilled on both sides of the lesser tuberosity osteotomy, through which 3 Fibertapes were passed. The implant was then placed through the loop of all 3 Fibertapes and impacted with an excellent press-fit. This achieved excellent anatomic reconstruction of the proximal humerus.  The joint was then copiously irrigated with pulse lavage.  The subscapularis and   lesser tuberosity osteotomy were then repaired using the 3 Fibertapes previously passed.   One #1 Ethibond was placed at the rotator interval just above   the lesser tuberosity. Copious irrigation was used.  Experel was used to infiltrate the subcutaneous tissues and the deep joint with a mixture of 20 cc Experel and 20 cc normal saline. Skin was closed  with 2-0 Vicryl sutures in the deep dermal layer and 4-0 Monocryl in a subcuticular  running fashion.  Sterile dressings were then applied including Aquacel.  The patient was placed in a sling and allowed to awaken from general anesthesia and taken to the recovery room in stable condition.      POSTOPERATIVE PLAN:  Early passive range of motion will be allowed with the goal of 40 degrees external rotation and 140 degrees forward elevation.  No internal rotation at this time.  No active motion of the arm until the lesser tuberosity heals.  The patient will likely be kept in the hospital for 1-2 days and then discharged home.

## 2014-08-31 NOTE — Transfer of Care (Signed)
Immediate Anesthesia Transfer of Care Note  Patient: Carol Woods  Procedure(s) Performed: Procedure(s) with comments: TOTAL SHOULDER ARTHROPLASTY (Right) - Right total shoulder arthroplasty  Patient Location: PACU  Anesthesia Type:General and Regional  Level of Consciousness: awake, alert  and oriented  Airway & Oxygen Therapy: Patient Spontanous Breathing and Patient connected to nasal cannula oxygen  Post-op Assessment: Report given to PACU RN, Post -op Vital signs reviewed and stable and Patient moving all extremities X 4  Post vital signs: Reviewed and stable  Complications: No apparent anesthesia complications

## 2014-08-31 NOTE — H&P (Signed)
Carol Woods is an 80 y.o. female.   Chief Complaint: R shoulder pain and dsyfunction HPI: Severe endstage R shoulder primary osteoarthritis failed conservative treatment with NSAIDS, activity mod, PT, injections.  Past Medical History  Diagnosis Date  . Hypertension   . Hypothyroidism   . GERD (gastroesophageal reflux disease)     OTC, changed diet  . Arthritis   . Cataracts, both eyes     Past Surgical History  Procedure Laterality Date  . Back surgery      cervical Dr Ellene Route  . Back surgery      Lumbar Dr Ellene Route  . Tonsillectomy      age 76  . Colonoscopy w/ polypectomy      History reviewed. No pertinent family history. Social History:  reports that she quit smoking about 30 years ago. Her smoking use included Cigarettes. She has a 37.5 pack-year smoking history. She has never used smokeless tobacco. She reports that she drinks alcohol. She reports that she does not use illicit drugs.  Allergies: No Known Allergies  Medications Prior to Admission  Medication Sig Dispense Refill  . brimonidine (ALPHAGAN) 0.2 % ophthalmic solution Place 1 drop into the right eye 2 (two) times daily.    Marland Kitchen CALCIUM PO Take 1 tablet by mouth daily.    . Cholecalciferol (VITAMIN D PO) Take 1 tablet by mouth daily.    Marland Kitchen levothyroxine (SYNTHROID) 125 MCG tablet Take 125 mcg by mouth daily before breakfast.    . lisinopril (PRINIVIL,ZESTRIL) 20 MG tablet Take 20 mg by mouth daily.    . metroNIDAZOLE (METROGEL) 0.75 % gel Apply 1 application topically daily.    . Omega-3 Fatty Acids (FISH OIL PO) Take 1 capsule by mouth daily.    . psyllium (METAMUCIL SMOOTH TEXTURE) 28 % packet Take 1 packet by mouth daily.    Marland Kitchen aspirin EC 81 MG tablet Take 81 mg by mouth daily.      No results found for this or any previous visit (from the past 48 hour(s)). No results found.  Review of Systems  All other systems reviewed and are negative.   Blood pressure 162/73, pulse 69, temperature 97.3 F (36.3 C),  temperature source Oral, height 5\' 1"  (1.549 m), weight 59.421 kg (131 lb), SpO2 97 %. Physical Exam  Constitutional: She is oriented to person, place, and time. She appears well-developed and well-nourished.  HENT:  Head: Atraumatic.  Eyes: EOM are normal.  Cardiovascular: Intact distal pulses.   Respiratory: Effort normal.  Musculoskeletal:  Pain and crepitance with ROM R shoulder  Neurological: She is alert and oriented to person, place, and time.  Skin: Skin is warm and dry.  Psychiatric: She has a normal mood and affect.     Assessment/Plan R shoulder endstage osteoarthritis Plan R shoulder TSA Risks / benefits of surgery discussed Consent on chart  NPO for OR Preop antibiotics   Reegan Mctighe WILLIAM 08/31/2014, 10:10 AM

## 2014-08-31 NOTE — Progress Notes (Signed)
Utilization review completed.  

## 2014-08-31 NOTE — Anesthesia Postprocedure Evaluation (Signed)
Anesthesia Post Note  Patient: Carol Woods  Procedure(s) Performed: Procedure(s) (LRB): TOTAL SHOULDER ARTHROPLASTY (Right)  Anesthesia type: general  Patient location: PACU  Post pain: Pain level controlled  Post assessment: Patient's Cardiovascular Status Stable  Last Vitals:  Filed Vitals:   08/31/14 1530  BP:   Pulse: 81  Temp: 36.6 C  Resp: 22    Post vital signs: Reviewed and stable  Level of consciousness: sedated  Complications: No apparent anesthesia complications

## 2014-08-31 NOTE — Anesthesia Procedure Notes (Addendum)
Anesthesia Regional Block:  Interscalene brachial plexus block  Pre-Anesthetic Checklist: ,, timeout performed, Correct Patient, Correct Site, Correct Laterality, Correct Procedure, Correct Position, site marked, Risks and benefits discussed,  Surgical consent,  Pre-op evaluation,  At surgeon's request and post-op pain management  Laterality: Right  Prep: chloraprep       Needles:  Injection technique: Single-shot  Needle Type: Echogenic Stimulator Needle     Needle Length: 9cm 9 cm Needle Gauge: 21 and 21 G    Additional Needles:  Procedures: ultrasound guided (picture in chart) and nerve stimulator Interscalene brachial plexus block  Nerve Stimulator or Paresthesia:  Response: 0.4 mA,   Additional Responses:   Narrative:  Start time: 08/31/2014 10:15 AM End time: 08/31/2014 10:30 AM Injection made incrementally with aspirations every 5 mL.  Performed by: Personally  Anesthesiologist: Lillia Abed  Additional Notes: Monitors applied. Patient sedated. Sterile prep and drape,hand hygiene and sterile gloves were used. Relevant anatomy identified.Needle position confirmed.Local anesthetic injected incrementally after negative aspiration. Local anesthetic spread visualized around nerve(s). Vascular puncture avoided. No complications. Image printed for medical record.The patient tolerated the procedure well.        Procedure Name: Intubation Date/Time: 08/31/2014 10:54 AM Performed by: Garrison Columbus T Pre-anesthesia Checklist: Patient identified, Emergency Drugs available, Suction available and Patient being monitored Patient Re-evaluated:Patient Re-evaluated prior to inductionOxygen Delivery Method: Circle system utilized Preoxygenation: Pre-oxygenation with 100% oxygen Intubation Type: IV induction Ventilation: Mask ventilation without difficulty Laryngoscope Size: Miller and 2 Grade View: Grade II Tube type: Oral Tube size: 7.5 mm Number of attempts: 1 Airway  Equipment and Method: Stylet Placement Confirmation: ETT inserted through vocal cords under direct vision,  positive ETCO2 and breath sounds checked- equal and bilateral Secured at: 19 cm Tube secured with: Tape Dental Injury: Teeth and Oropharynx as per pre-operative assessment

## 2014-09-01 ENCOUNTER — Encounter (HOSPITAL_COMMUNITY): Payer: Self-pay | Admitting: Orthopedic Surgery

## 2014-09-01 LAB — BASIC METABOLIC PANEL
Anion gap: 10 (ref 5–15)
BUN: 12 mg/dL (ref 6–23)
CHLORIDE: 111 meq/L (ref 96–112)
CO2: 20 mEq/L (ref 19–32)
CREATININE: 0.47 mg/dL — AB (ref 0.50–1.10)
Calcium: 7 mg/dL — ABNORMAL LOW (ref 8.4–10.5)
GLUCOSE: 102 mg/dL — AB (ref 70–99)
POTASSIUM: 3.3 meq/L — AB (ref 3.7–5.3)
Sodium: 141 mEq/L (ref 137–147)

## 2014-09-01 LAB — CBC
HCT: 28.7 % — ABNORMAL LOW (ref 36.0–46.0)
HEMOGLOBIN: 9.7 g/dL — AB (ref 12.0–15.0)
MCH: 30.3 pg (ref 26.0–34.0)
MCHC: 33.8 g/dL (ref 30.0–36.0)
MCV: 89.7 fL (ref 78.0–100.0)
Platelets: 166 10*3/uL (ref 150–400)
RBC: 3.2 MIL/uL — ABNORMAL LOW (ref 3.87–5.11)
RDW: 12.7 % (ref 11.5–15.5)
WBC: 7.6 10*3/uL (ref 4.0–10.5)

## 2014-09-01 NOTE — Progress Notes (Signed)
CARE MANAGEMENT NOTE 09/01/2014  Patient:  CHARNA, NEEB   Account Number:  [id number]  Date Initiated:  09/01/2014  Documentation initiated by:  [facility]  Subjective/Objective Assessment:   s/p rt total shoulder arthroplasty     Action/Plan:   PT/Ot evals- recommended HHPT and HHOT, no equipment needs   Anticipated DC Date:  09/01/2014   Anticipated DC Plan:  [place]  CM consult      [facility] Choice  HOME HEALTH   Choice offered to / List presented to:  C-1 Patient        [place] arranged  [place].   Status of service:  Completed, signed off Medicare Important Message given?   (If response is "NO", the following Medicare IM given date fields will be blank) Date Medicare IM given:   Medicare IM given by:   Date Additional Medicare IM given:   Additional Medicare IM given by:    Discharge Disposition:  [place]  Per UR Regulation:    If discussed at Long Length of Stay Meetings, dates discussed:    Comments:  09/01/14 Spoke with patient about HHC. She chose [facility]. Contacted Miranda at [facility] [facility] and set up [place], [place] and [place] aide visits. Patient stated that she will have a friend staying with her. No equipment needs identified. Fuller Plan RN, BSN, CCM

## 2014-09-01 NOTE — Evaluation (Signed)
Occupational Therapy Evaluation Patient Details Name: XARIAH SILVERNAIL MRN: [id number] DOB: 23-Feb-1934 Today's Date: 09/01/2014    History of Present Illness Pt s/p right TSA. PMHx: bad left shoulder, back surgery, numbness in feet, decreased balance due to back issues   Clinical Impression   This 80 yo female admitted and underwent above presents to acute OT with decreased use of RUE, increased pain, and decreased balance when she is up on her feet. She will benefit from at least one more session (if not more of acute OT) and then home with HHOT and HHPT as well as HHAide.     Follow Up Recommendations  Home health OT (also need HHPT due to balance deficits)    Equipment Recommendations  None recommended by OT       Precautions / Restrictions Precautions Precautions: Fall;Shoulder Shoulder Interventions: Shoulder sling/immobilizer;Off for dressing/bathing/exercises Required Braces or Orthoses: Sling Restrictions Weight Bearing Restrictions: Yes RUE Weight Bearing: Non weight bearing      Mobility Bed Mobility               General bed mobility comments: Pt sitting EOB upon my arrival  Transfers Overall transfer level: Needs assistance Equipment used: Straight cane Transfers: Sit to/from Stand Sit to Stand: Min guard              Balance Overall balance assessment: Needs assistance Sitting-balance support: Feet supported;No upper extremity supported Sitting balance-Leahy Scale: Fair     Standing balance support: Single extremity supported Standing balance-Leahy Scale: Poor Standing balance comment: occassional LOB without use of SPC, pt able to self correct                                            Pertinent Vitals/Pain Pain Assessment: 0-10 Pain Score: 4  Pain Location: right shoulder Pain Descriptors / Indicators: Aching;Sore Pain Intervention(s): Monitored during session;Repositioned;Patient requesting pain meds-RN notified      Hand Dominance Right   Extremity/Trunk Assessment Upper Extremity Assessment Upper Extremity Assessment: RUE deficits/detail RUE Deficits / Details: shoulder sx this admission; rest of arm WNL RUE Coordination: decreased gross motor           Communication Communication Communication: No difficulties   Cognition Arousal/Alertness: Awake/alert Behavior During Therapy: WFL for tasks assessed/performed Overall Cognitive Status: Within Functional Limits for tasks assessed                        Exercises   Other Exercises Other Exercises: Pt completed 10 reps each of Dr. Bettina Gavia post op TSA exercises supine for shoulder flexion up to 140 degrees (pt achieved 30 degrees today) and shoulder external rotation up to 40 degrees (pt achieved neutral today)   Shoulder Instructions Shoulder Instructions Donning/doffing shirt without moving shoulder: Minimal assistance Method for sponge bathing under operated UE: Minimal assistance Donning/doffing sling/immobilizer: Minimal assistance Correct positioning of sling/immobilizer: Minimal assistance Pendulum exercises (written home exercise program):  (NA) ROM for elbow, wrist and digits of operated UE: Independent Sling wearing schedule (on at all times/off for ADL's): Independent Proper positioning of operated UE when showering: Independent Dressing change:  (NA) Positioning of UE while sleeping: [place] expects to be discharged to:: Private residence Living Arrangements: Alone Available Help at Discharge: Friend(s);Available PRN/intermittently Type of Home: House Home Access: Stairs to enter [employer] of Steps: 4 Entrance  Stairs-Rails: Left (going up) Home Layout: One level     Bathroom Shower/Tub: Tub/shower unit;Curtain Shower/tub characteristics: Architectural technologist: Standard     Home Equipment: Cane - single point          Prior  Functioning/Environment Level of Independence: Independent             OT Diagnosis: Generalized weakness;Acute pain   OT Problem List: Decreased strength;Decreased range of motion;Impaired balance (sitting and/or standing);Pain;Impaired UE functional use;Decreased knowledge of precautions   OT Treatment/Interventions: Self-care/ADL training;Therapeutic exercise;DME and/or AE instruction;Balance training;Patient/family education    OT Goals(Current goals can be found in the care plan section) Acute Rehab OT Goals Patient Stated Goal: home today or tomorrow OT Goal Formulation: With patient Time For Goal Achievement: 09/02/14 Potential to Achieve Goals: Good  OT Frequency: Min 2X/week   Barriers to D/C: Decreased caregiver support             End of Session Equipment Utilized During Treatment:  (sling) Nurse Communication:  (I will see pt again after lunch and then determine if safe to go home)  Activity Tolerance: Patient tolerated treatment well Patient left: in chair;with call bell/phone within reach;with family/visitor present   Time: 7416-3845 OT Time Calculation (min): 68 min Charges:  OT General Charges $OT Visit: 1 Procedure OT Evaluation $Initial OT Evaluation Tier I: 1 Procedure OT Treatments $Self Care/Home Management : 23-37 mins $Therapeutic Exercise: 23-37 mins  Almon Register [phone] 09/01/2014, 12:14 PM

## 2014-09-01 NOTE — Progress Notes (Signed)
   PATIENT ID: Carol Woods   1 Day Post-Op Procedure(s) (LRB): TOTAL SHOULDER ARTHROPLASTY (Right)  Subjective: Some soreness right shoulder but tolerable. Sitting up in chair. No other problems or concerns.  Objective:  Filed Vitals:   09/01/14 0140  BP: 116/61  Pulse: 71  Temp: 97.8 F (36.6 C)  Resp: 16     Awake, alert, orientated R UE dressing c/d/i Sling repositioned Wiggles fingers, distally NVI Fires deltoid  Labs:   Recent Labs  09/01/14 0500  HGB 9.7*   Recent Labs  09/01/14 0500  WBC 7.6  RBC 3.20*  HCT 28.7*  PLT 166   Recent Labs  09/01/14 0500  NA 141  K 3.3*  CL 111  CO2 20  BUN 12  CREATININE 0.47*  GLUCOSE 102*  CALCIUM 7.0*    Assessment and Plan: 1 day s/p right TSA Continue po percocet for pain control, rx in chart for home pain control ABLA- expected 9.7 and asymptomatic, will continue to monitor OT today Care coordinator ordered to consult with patient regarding home health care after d/c Plan to d/c home today, possibly establish home health aid if needed and available, once pain controlled and cleared by OT  VTE proph: ASA 325mg  BID, scds

## 2014-09-01 NOTE — Progress Notes (Signed)
Occupational Therapy Treatment Patient Details Name: Carol Woods MRN: [id number] DOB: 1933-11-28 Today's Date: 09/01/2014    History of present illness Pt s/p right TSA. PMHx: bad left shoulder, back surgery, numbness in feet, decreased balance due to back issues   OT comments  This 80 yo female making progress and did better this second session over the first session in the AM. Still needs reinforcement--if here we will see her again tomorrow AM, if not per pt HH is to start tomorrow.  Follow Up Recommendations  Home health OT (HHPT and HHAide)    Equipment Recommendations  None recommended by OT       Precautions / Restrictions Precautions Precautions: Fall;Shoulder Shoulder Interventions: Shoulder sling/immobilizer;Off for dressing/bathing/exercises Required Braces or Orthoses: Sling Restrictions Weight Bearing Restrictions: Yes RUE Weight Bearing: Non weight bearing       Mobility Bed Mobility Overal bed mobility: Modified Independent                Transfers Overall transfer level: Needs assistance Equipment used: Straight cane Transfers: Sit to/from Stand Sit to Stand: Supervision         General transfer comment: ambulated around 1/2 unit with SPC with S    Balance Overall balance assessment: Needs assistance Sitting-balance support: Feet supported;No upper extremity supported Sitting balance-Leahy Scale: Fair     Standing balance support: Single extremity supported Standing balance-Leahy Scale: Fair                                     Cognition   Behavior During Therapy: WFL for tasks assessed/performed Overall Cognitive Status: Within Functional Limits for tasks assessed                         Exercises Other Exercises Other Exercises: Pt completed 10 reps each of Dr. Bettina Gavia post op TSA exercises supine for shoulder flexion up to 140 degrees (pt achieved 40 degrees this session) and shoulder external rotation  up to 40 degrees (pt achieved neutral today). Pt able to do these with intermittent S    Shoulder Instructions Shoulder Instructions Donning/doffing shirt without moving shoulder: Minimal assistance Method for sponge bathing under operated UE: Minimal assistance Donning/doffing sling/immobilizer: Modified independent Correct positioning of sling/immobilizer: Modified independent Pendulum exercises (written home exercise program):  (NA) ROM for elbow, wrist and digits of operated UE: Independent Sling wearing schedule (on at all times/off for ADL's): Independent Proper positioning of operated UE when showering: Independent Dressing change:  (NA) Positioning of UE while sleeping: Independent          Pertinent Vitals/ Pain       Pain Assessment: 0-10 Pain Score: 2  Pain Location: right shoulder Pain Descriptors / Indicators: Aching;Sore Pain Intervention(s): Monitored during session;Repositioned         Frequency Min 2X/week     Progress Toward Goals  OT Goals(current goals can now be found in the care plan section)  Progress towards OT goals: Progressing toward goals     Plan Discharge plan remains appropriate       End of Session Equipment Utilized During Treatment:  (sling)   Activity Tolerance Patient tolerated treatment well   Patient Left in bed;with call bell/phone within reach   Nurse Communication  (Feel like pt is safe to go home, someone will be staying with her tonight and the next few nights as needed)  Time: 2952-8413 OT Time Calculation (min): 37 min  Charges: OT General Charges $OT Visit: 1 Procedure OT Treatments $Self Care/Home Management : 8-22 mins $Therapeutic Exercise: 8-22 mins  Almon Register [phone] 09/01/2014, 4:22 PM

## 2014-09-01 NOTE — Plan of Care (Signed)
Problem: Phase I Progression Outcomes Goal: Pain controlled with appropriate interventions Outcome: Progressing Pts block still managing pts pain    Goal: OOB as tolerated unless otherwise ordered Outcome: Completed/Met Date Met:  09/01/14 Goal: Clear liquids, advance diet as tolerated Outcome: Completed/Met Date Met:  09/01/14 Goal: OT evaluation for ADLs discussed Outcome: Completed/Met Date Met:  09/01/14 Goal: Discharge plan established Outcome: Completed/Met Date Met:  09/01/14 Goal: Voiding-avoid urinary catheter unless indicated Outcome: Completed/Met Date Met:  09/01/14

## 2014-09-04 NOTE — Discharge Summary (Signed)
Patient ID: Carol Woods MRN: [id number] DOB/AGE: 30-Mar-1934 80 y.o.  Admit date: 08/31/2014 Discharge date: 09/01/2014  Admission Diagnoses:  Active Problems:   Status post total shoulder arthroplasty   Discharge Diagnoses:  Same  Past Medical History  Diagnosis Date  . Hypertension   . Hypothyroidism   . GERD (gastroesophageal reflux disease)     OTC, changed diet  . Cataracts, both eyes   . High cholesterol   . Arthritis     "back; shoulders" (08/31/2014)    Surgeries: Procedure(s): TOTAL SHOULDER ARTHROPLASTY on 08/31/2014   Consultants:    Discharged Condition: Improved  Hospital Course: Carol Woods is an 80 y.o. female who was admitted 08/31/2014 for operative treatment of end stage OA right shoulder. Patient has severe unremitting pain that affects sleep, daily activities, and work/hobbies. After pre-op clearance the patient was taken to the operating room on 08/31/2014 and underwent  Procedure(s): TOTAL SHOULDER ARTHROPLASTY.    Patient was given perioperative antibiotics:  Anti-infectives    Start     Dose/Rate Route Frequency Ordered Stop   08/31/14 1830  ceFAZolin (ANCEF) IVPB 1 g/50 mL premix     1 g100 mL/hr over 30 Minutes Intravenous Every 6 hours 08/31/14 1754 09/01/14 0616   08/31/14 0600  ceFAZolin (ANCEF) IVPB 2 g/50 mL premix     2 g100 mL/hr over 30 Minutes Intravenous On call to O.R. 08/30/14 1354 08/31/14 1100       Patient was given sequential compression devices, early ambulation, and ASA 325mg  BID to prevent DVT.  Patient benefited maximally from hospital stay and there were no complications.  She will be discharged home with home health OT/PT given safety concerns and needs for home care/therapy  Recent vital signs: No data found.    Recent laboratory studies: No results for input(s): WBC, HGB, HCT, PLT, NA, K, CL, CO2, BUN, CREATININE, GLUCOSE, INR, CALCIUM in the last 72 hours.  Invalid input(s): PT, 2   Discharge  Medications:     Medication List    TAKE these medications        aspirin EC 81 MG tablet  Take 81 mg by mouth daily.     brimonidine 0.2 % ophthalmic solution  Commonly known as:  ALPHAGAN  Place 1 drop into the right eye 2 (two) times daily.     CALCIUM PO  Take 1 tablet by mouth daily.     docusate sodium 100 MG capsule  Commonly known as:  COLACE  Take 1 capsule (100 mg total) by mouth 3 (three) times daily as needed.     FISH OIL PO  Take 1 capsule by mouth daily.     lisinopril 20 MG tablet  Commonly known as:  PRINIVIL,ZESTRIL  Take 20 mg by mouth daily.     metroNIDAZOLE 0.75 % gel  Commonly known as:  METROGEL  Apply 1 application topically daily.     oxyCODONE-acetaminophen 5-325 MG per tablet  Commonly known as:  ROXICET  Take 1-2 tablets by mouth every 4 (four) hours as needed for severe pain.     psyllium 28 % packet  Commonly known as:  METAMUCIL SMOOTH TEXTURE  Take 1 packet by mouth daily.     SYNTHROID 125 MCG tablet  Generic drug:  levothyroxine  Take 125 mcg by mouth daily before breakfast.     VITAMIN D PO  Take 1 tablet by mouth daily.        Diagnostic Studies: Dg Shoulder Right Port  08/31/2014   CLINICAL DATA:  Right shoulder replacement  EXAM: PORTABLE RIGHT SHOULDER - 2+ VIEW  COMPARISON:  None.  FINDINGS: Single portable view of the right shoulder submitted. There is proximal right humeral prosthesis in anatomic alignment with glenoid.  IMPRESSION: Right shoulder prosthesis with anatomic alignment.   Electronically Signed   By: Lahoma Crocker M.D.   On: 08/31/2014 14:05    Disposition: 01-Home or Self Care      Discharge Instructions    Call MD / Call 911    Complete by:  As directed   If you experience chest pain or shortness of breath, CALL 911 and be transported to the hospital emergency room.  If you develope a fever above 101 F, pus (white drainage) or increased drainage or redness at the wound, or calf pain, call your surgeon's  office.     Constipation Prevention    Complete by:  As directed   Drink plenty of fluids.  Prune juice may be helpful.  You may use a stool softener, such as Colace (over the counter) 100 mg twice a day.  Use MiraLax (over the counter) for constipation as needed.     Diet - low sodium heart healthy    Complete by:  As directed      Increase activity slowly as tolerated    Complete by:  As directed            Follow-up Information    Follow up with Nita Sells, MD. Schedule an appointment as soon as possible for a visit in 2 weeks.   Specialty:  Orthopedic Surgery   Contact information:   [address]  [place] [address] [phone]       Follow up with [facility].   Why:  They will contact you to schedule physical and occupational therapy visits.   Contact information:   [address] [place] [address] [phone]        Signed: Grier Mitts 09/04/2014, 12:53 PM

## 2015-02-05 DIAGNOSIS — Z9849 Cataract extraction status, unspecified eye: Secondary | ICD-10-CM

## 2015-02-05 HISTORY — DX: Cataract extraction status, unspecified eye: Z98.49

## 2017-10-28 ENCOUNTER — Encounter: Payer: Self-pay | Admitting: Hematology and Oncology

## 2017-10-28 ENCOUNTER — Telehealth: Payer: Self-pay | Admitting: Hematology and Oncology

## 2017-10-28 NOTE — Telephone Encounter (Signed)
Appt has been scheduled for the pt to see Dr. Garrison [place] on 3/4 at 9am. Pt aware to arrive 30 minutes early. Address verified. Letter mailed to the pt and referring office notified.

## 2017-11-20 ENCOUNTER — Telehealth: Payer: Self-pay

## 2017-11-20 NOTE — Telephone Encounter (Signed)
A call was made to patients PCP medical records department and a request was made for them to fax over patients most recent lab results.

## 2017-11-23 ENCOUNTER — Telehealth: Payer: Self-pay

## 2017-11-23 ENCOUNTER — Inpatient Hospital Stay: Payer: Medicare Other

## 2017-11-23 ENCOUNTER — Inpatient Hospital Stay: Payer: Medicare Other | Attending: Hematology and Oncology | Admitting: Hematology and Oncology

## 2017-11-23 ENCOUNTER — Encounter: Payer: Self-pay | Admitting: Hematology and Oncology

## 2017-11-23 VITALS — BP 196/74 | HR 80 | Temp 98.3°F | Resp 18 | Ht 61.0 in | Wt 136.2 lb

## 2017-11-23 DIAGNOSIS — Z7982 Long term (current) use of aspirin: Secondary | ICD-10-CM | POA: Insufficient documentation

## 2017-11-23 DIAGNOSIS — Z79899 Other long term (current) drug therapy: Secondary | ICD-10-CM | POA: Diagnosis not present

## 2017-11-23 DIAGNOSIS — G629 Polyneuropathy, unspecified: Secondary | ICD-10-CM

## 2017-11-23 DIAGNOSIS — R5383 Other fatigue: Secondary | ICD-10-CM

## 2017-11-23 DIAGNOSIS — R531 Weakness: Secondary | ICD-10-CM

## 2017-11-23 DIAGNOSIS — M1990 Unspecified osteoarthritis, unspecified site: Secondary | ICD-10-CM | POA: Insufficient documentation

## 2017-11-23 DIAGNOSIS — E7800 Pure hypercholesterolemia, unspecified: Secondary | ICD-10-CM | POA: Diagnosis not present

## 2017-11-23 DIAGNOSIS — D472 Monoclonal gammopathy: Secondary | ICD-10-CM | POA: Insufficient documentation

## 2017-11-23 DIAGNOSIS — E039 Hypothyroidism, unspecified: Secondary | ICD-10-CM | POA: Diagnosis not present

## 2017-11-23 DIAGNOSIS — K219 Gastro-esophageal reflux disease without esophagitis: Secondary | ICD-10-CM

## 2017-11-23 DIAGNOSIS — M129 Arthropathy, unspecified: Secondary | ICD-10-CM | POA: Diagnosis not present

## 2017-11-23 DIAGNOSIS — I10 Essential (primary) hypertension: Secondary | ICD-10-CM

## 2017-11-23 DIAGNOSIS — M4800 Spinal stenosis, site unspecified: Secondary | ICD-10-CM | POA: Insufficient documentation

## 2017-11-23 DIAGNOSIS — Z87891 Personal history of nicotine dependence: Secondary | ICD-10-CM

## 2017-11-23 LAB — CBC WITH DIFFERENTIAL (CANCER CENTER ONLY)
Basophils Absolute: 0 10*3/uL (ref 0.0–0.1)
Basophils Relative: 1 %
Eosinophils Absolute: 0.1 10*3/uL (ref 0.0–0.5)
Eosinophils Relative: 2 %
HEMATOCRIT: 39.6 % (ref 34.8–46.6)
Hemoglobin: 13.5 g/dL (ref 11.6–15.9)
Lymphocytes Relative: 29 %
Lymphs Abs: 1.6 10*3/uL (ref 0.9–3.3)
MCH: 30.4 pg (ref 25.1–34.0)
MCHC: 34.1 g/dL (ref 31.5–36.0)
MCV: 89.2 fL (ref 79.5–101.0)
Monocytes Absolute: 0.5 10*3/uL (ref 0.1–0.9)
Monocytes Relative: 10 %
NEUTROS ABS: 3.2 10*3/uL (ref 1.5–6.5)
NEUTROS PCT: 58 %
Platelet Count: 212 10*3/uL (ref 145–400)
RBC: 4.44 MIL/uL (ref 3.70–5.45)
RDW: 12.7 % (ref 11.2–14.5)
WBC Count: 5.4 10*3/uL (ref 3.9–10.3)

## 2017-11-23 LAB — CMP (CANCER CENTER ONLY)
ALBUMIN: 4 g/dL (ref 3.5–5.0)
ALT: 20 U/L (ref 0–55)
ANION GAP: 8 (ref 3–11)
AST: 17 U/L (ref 5–34)
Alkaline Phosphatase: 68 U/L (ref 40–150)
BILIRUBIN TOTAL: 0.6 mg/dL (ref 0.2–1.2)
BUN: 15 mg/dL (ref 7–26)
CALCIUM: 9.6 mg/dL (ref 8.4–10.4)
CO2: 24 mmol/L (ref 22–29)
CREATININE: 0.73 mg/dL (ref 0.60–1.10)
Chloride: 105 mmol/L (ref 98–109)
GFR, Est AFR Am: >60 mL/min (ref >60)
GFR, Estimated: >60 mL/min (ref >60)
GLUCOSE: 107 mg/dL (ref 70–140)
Potassium: 4.3 mmol/L (ref 3.5–5.1)
Sodium: 137 mmol/L (ref 136–145)
Total Protein: 7.6 g/dL (ref 6.4–8.3)

## 2017-11-23 LAB — LACTATE DEHYDROGENASE: LDH: 170 U/L (ref 125–245)

## 2017-11-23 LAB — VITAMIN B12: Vitamin B-12: 571 pg/mL (ref 180–914)

## 2017-11-23 NOTE — Telephone Encounter (Signed)
Printed avs and calender of upcoming appointment. Per 3/4 los 

## 2017-11-24 ENCOUNTER — Telehealth: Payer: Self-pay

## 2017-11-24 LAB — KAPPA/LAMBDA LIGHT CHAINS
Kappa free light chain: 13.8 mg/L (ref 3.3–19.4)
Kappa, lambda light chain ratio: 0.92 (ref 0.26–1.65)
Lambda free light chains: 15 mg/L (ref 5.7–26.3)

## 2017-11-24 LAB — BETA 2 MICROGLOBULIN, SERUM: Beta-2 Microglobulin: 2.3 mg/L (ref 0.6–2.4)

## 2017-11-24 LAB — VISCOSITY, SERUM: Viscosity, Serum: 2 rel.saline — ABNORMAL HIGH (ref 1.6–1.9)

## 2017-11-24 NOTE — Telephone Encounter (Signed)
Skeletal survey scheduled for Thursday 11/26/17 at 11:00 am. Patient to arrive at 10:45 am. Left voice message for patient with appointment day and time. Instructed patient to call to confirm receipt of message.

## 2017-11-25 ENCOUNTER — Telehealth: Payer: Self-pay

## 2017-11-25 NOTE — Telephone Encounter (Signed)
Called patient to inform her of appointment tomorrow for Skeletal Survey. Patient stated she cannot come tomorrow. Patient stated she needs to take some time to think about whether or not she wants to have the bone survey done. Patient stated she will call the office back if she decides to have the bone survey done. Dr. Lebron Conners made aware.

## 2017-11-26 ENCOUNTER — Ambulatory Visit (HOSPITAL_COMMUNITY): Payer: Medicare Other

## 2017-11-26 LAB — MULTIPLE MYELOMA PANEL, SERUM
ALBUMIN SERPL ELPH-MCNC: 3.8 g/dL (ref 2.9–4.4)
Albumin/Glob SerPl: 1.2 (ref 0.7–1.7)
Alpha 1: 0.2 g/dL (ref 0.0–0.4)
Alpha2 Glob SerPl Elph-Mcnc: 0.7 g/dL (ref 0.4–1.0)
B-GLOBULIN SERPL ELPH-MCNC: 1 g/dL (ref 0.7–1.3)
Gamma Glob SerPl Elph-Mcnc: 1.4 g/dL (ref 0.4–1.8)
Globulin, Total: 3.2 g/dL (ref 2.2–3.9)
IGA: 104 mg/dL (ref 64–422)
IgG (Immunoglobin G), Serum: 1480 mg/dL (ref 700–1600)
IgM (Immunoglobulin M), Srm: 63 mg/dL (ref 26–217)
M Protein SerPl Elph-Mcnc: 0.9 g/dL — ABNORMAL HIGH
TOTAL PROTEIN ELP: 7 g/dL (ref 6.0–8.5)

## 2017-11-27 ENCOUNTER — Inpatient Hospital Stay: Payer: Medicare Other

## 2017-11-27 DIAGNOSIS — D472 Monoclonal gammopathy: Secondary | ICD-10-CM | POA: Diagnosis not present

## 2017-11-30 LAB — UIFE/LIGHT CHAINS/TP QN, 24-HR UR
FR KAPPA LT CH,24HR: 15 mg/(24.h)
FR LAMBDA LT CH,24HR: 2 mg/(24.h)
FREE LAMBDA LT CHAINS, UR: 1.37 mg/L (ref 0.24–6.66)
Free Kappa Lt Chains,Ur: 11.6 mg/L (ref 1.35–24.19)
Free Kappa/Lambda Ratio: 8.47 (ref 2.04–10.37)
TOTAL PROTEIN, URINE-UPE24: 5 mg/dL
Total Protein, Urine-Ur/day: 63 mg/24 hr (ref 30–150)
Total Volume: 1250

## 2017-11-30 LAB — METHYLMALONIC ACID, SERUM: Methylmalonic Acid, Quantitative: 424 nmol/L — ABNORMAL HIGH (ref 0–378)

## 2017-12-01 ENCOUNTER — Ambulatory Visit (HOSPITAL_COMMUNITY)
Admission: RE | Admit: 2017-12-01 | Discharge: 2017-12-01 | Disposition: A | Discharge status: Home / Self Care | Payer: Medicare Other | Source: Ambulatory Visit | Attending: Hematology and Oncology | Admitting: Hematology and Oncology

## 2017-12-01 DIAGNOSIS — M4322 Fusion of spine, cervical region: Secondary | ICD-10-CM | POA: Insufficient documentation

## 2017-12-01 DIAGNOSIS — M4326 Fusion of spine, lumbar region: Secondary | ICD-10-CM | POA: Insufficient documentation

## 2017-12-01 DIAGNOSIS — Z981 Arthrodesis status: Secondary | ICD-10-CM | POA: Insufficient documentation

## 2017-12-01 DIAGNOSIS — M5136 Other intervertebral disc degeneration, lumbar region: Secondary | ICD-10-CM | POA: Insufficient documentation

## 2017-12-01 DIAGNOSIS — Z96611 Presence of right artificial shoulder joint: Secondary | ICD-10-CM | POA: Diagnosis not present

## 2017-12-01 DIAGNOSIS — D472 Monoclonal gammopathy: Secondary | ICD-10-CM | POA: Diagnosis not present

## 2017-12-01 DIAGNOSIS — M19012 Primary osteoarthritis, left shoulder: Secondary | ICD-10-CM | POA: Diagnosis not present

## 2017-12-02 DIAGNOSIS — D472 Monoclonal gammopathy: Secondary | ICD-10-CM | POA: Insufficient documentation

## 2017-12-02 NOTE — Assessment & Plan Note (Signed)
84 y.o. 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  Plan: -Repeat multiple myeloma lab work today as outlined below, add B12 and methylmalonic acid levels -Skeletal survey to assess for possible lytic skeletal lesions. -Return to clinic in 2 weeks to review the findings.

## 2017-12-02 NOTE — Progress Notes (Signed)
[facility] New Visit:  Assessment: MGUS (monoclonal gammopathy of unknown significance) 84 y.o. 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  Plan: -Repeat multiple myeloma lab work today as outlined below, add B12 and methylmalonic acid levels -Skeletal survey to assess for possible lytic skeletal lesions. -Return to clinic in 2 weeks to review the findings.   Voice recognition software was used and creation of this note. Despite my best effort at editing the text, some misspelling/errors may have occurred. Orders Placed This Encounter  Procedures  . DG Bone Survey Met    Standing Status:   Future    Number of Occurrences:   1    Standing Expiration Date:   01/24/2019    Order Specific Question:   Reason for Exam (SYMPTOM  OR DIAGNOSIS REQUIRED)    Answer:   MGUS, please eval for lytic lesions due to suspected Multuiple myeloma    Order Specific Question:   Preferred imaging location?    Answer:   [facility]    Order Specific Question:   Radiology Contrast Protocol - do NOT remove file path    Answer:   \\[email or web address]  . CBC with Differential ([facility] Only)    Standing Status:   Future    Number of Occurrences:   1    Standing Expiration Date:   11/24/2018  . CMP ([facility] only)    Standing Status:   Future    Number of Occurrences:   1    Standing Expiration Date:   11/24/2018  . Vitamin B12    Standing Status:   Future    Number of Occurrences:   1    Standing Expiration Date:   11/23/2018  . Methylmalonic acid, serum    Standing Status:   Future    Number of Occurrences:   1    Standing Expiration Date:   11/23/2018  .  Multiple Myeloma Panel (SPEP&IFE w/QIG)    Standing Status:   Future    Number of Occurrences:   1    Standing Expiration Date:   11/23/2018  . 24 Hr Ur UIFE/Light chains/TP Qnt    Standing Status:   Future    Number of Occurrences:   1    Standing Expiration Date:   11/23/2018  . Kappa/lambda light chains    Standing Status:   Future    Number of Occurrences:   1    Standing Expiration Date:   11/23/2018  . Viscosity, serum    Standing Status:   Future    Number of Occurrences:   1    Standing Expiration Date:   11/23/2018  . Beta 2 microglobulin    Standing Status:   Future    Number of Occurrences:   1    Standing Expiration Date:   11/23/2018  . Lactate dehydrogenase (LDH)    Standing Status:   Future    Number of Occurrences:   1    Standing Expiration Date:   11/23/2018    All questions were answered.  . The patient knows to call the clinic with any problems, questions or concerns.  This note was electronically signed.    History of Presenting Illness  Carol Woods 84 y.o. presenting to the [facility] for evaluation of monoclonal gammopathy of unknown significance, referred by Dr Shon Baton.  Please see hematological history below for details of available lab work.  Past medical history significant for hypothyroidism, hypertension, osteoarthritis, lumbar spinal stenosis, and GERD.  Patient  is a former smoker, quit in 1977.  Patient does drink occasional alcohol.  At the present time, patient does complain of neuropathy with tingling in bilateral upper and lower extremities.  She does complain of fatigue and used to take naps throughout the day.  Recently, patient was started on B12 supplementation and reports significant improvement to resolution in fatigue and mental fog that she has been complaining of previously.  Patient denies any fevers, chills, night sweats.  Denies any facial or extremity weakness.  No dysuria, hematuria, constipation, diarrhea.  No significant recent  changes in the musculoskeletal complaints.  Oncological/hematological History: --Labs, 11/20/17: SPEP -- M-Spike 1.0g/dL, SIFE -- IgG lambda; IgG 1684, IgA 117, IgM 69; UPEP & UIFE -- no abnormal protein findings; Vit B12 327, Folate RBC 998  Medical History: Past Medical History:  Diagnosis Date  . Arthritis    "back; shoulders" (08/31/2014)  . Cataracts, both eyes   . GERD (gastroesophageal reflux disease)    OTC, changed diet  . High cholesterol   . Hypertension   . Hypothyroidism     Surgical History: Past Surgical History:  Procedure Laterality Date  . ANTERIOR CERVICAL DECOMP/DISCECTOMY FUSION  02/2010   Dr Ellene Route  . BACK SURGERY    . COLONOSCOPY W/ POLYPECTOMY    . DILATION AND CURETTAGE OF UTERUS    . POSTERIOR LUMBAR FUSION  04/2010   Dr Ellene Route  . TONSILLECTOMY     age 79  . TOTAL SHOULDER ARTHROPLASTY Right 08/31/2014  . TOTAL SHOULDER ARTHROPLASTY Right 08/31/2014   Procedure: TOTAL SHOULDER ARTHROPLASTY;  Surgeon: Nita Sells, MD;  Location: [facility];  Service: Orthopedics;  Laterality: Right;  Right total shoulder arthroplasty    Family History: History reviewed. No pertinent family history.  Social History: Social History   Socioeconomic History  . Marital status: Divorced    Spouse name: Not on file  . Number of children: Not on file  . Years of education: Not on file  . Highest education level: Not on file  Social Needs  . Financial resource strain: Not on file  . Food insecurity - worry: Not on file  . Food insecurity - inability: Not on file  . Transportation needs - medical: Not on file  . Transportation needs - non-medical: Not on file  Occupational History  . Not on file  Tobacco Use  . Smoking status: Former Smoker    Packs/day: 1.50    Years: 25.00    Pack years: 37.50    Types: Cigarettes    Last attempt to quit: 08/22/1984    Years since quitting: 33.3  . Smokeless tobacco: Never Used  Substance and Sexual Activity  .  Alcohol use: Yes    Alcohol/week: 7.2 oz    Types: 12 Glasses of wine per week  . Drug use: No  . Sexual activity: Not on file  Other Topics Concern  . Not on file  Social History Narrative  . Not on file    Allergies: No Known Allergies  Medications:  Current Outpatient Medications  Medication Sig Dispense Refill  . aspirin EC 81 MG tablet Take 81 mg by mouth daily.    . brimonidine (ALPHAGAN)  0.2 % ophthalmic solution Place 1 drop into the right eye 2 (two) times daily.    Marland Kitchen CALCIUM PO Take 1 tablet by mouth daily.    . Cholecalciferol (VITAMIN D PO) Take 1 tablet by mouth daily.    Marland Kitchen docusate sodium (COLACE) 100 MG capsule Take 1 capsule (100 mg total) by mouth 3 (three) times daily as needed. 20 capsule 0  . levothyroxine (SYNTHROID) 125 MCG tablet Take 125 mcg by mouth daily before breakfast.    . lisinopril (PRINIVIL,ZESTRIL) 20 MG tablet Take 20 mg by mouth daily.    . metroNIDAZOLE (METROGEL) 0.75 % gel Apply 1 application topically daily.    . Omega-3 Fatty Acids (FISH OIL PO) Take 1 capsule by mouth daily.    Marland Kitchen oxyCODONE-acetaminophen (ROXICET) 5-325 MG per tablet Take 1-2 tablets by mouth every 4 (four) hours as needed for severe pain. 60 tablet 0  . psyllium (METAMUCIL SMOOTH TEXTURE) 28 % packet Take 1 packet by mouth daily.     No current facility-administered medications for this visit.     Review of Systems: Review of Systems  Constitutional: Positive for fatigue.  Neurological: Positive for numbness.  All other systems reviewed and are negative.    PHYSICAL EXAMINATION Blood pressure (!) 196/74, pulse 80, temperature 98.3 F (36.8 C), temperature source Oral, resp. rate 18, height '5\' 1"'$  (1.549 m), weight 136 lb 3.2 oz (61.8 kg), SpO2 97 %.  ECOG PERFORMANCE STATUS: 1 - Symptomatic but completely ambulatory  Physical Exam  Constitutional: She is oriented to person, place, and time and well-developed, well-nourished, and in no distress. No distress.   HENT:  Head: Normocephalic and atraumatic.  Mouth/Throat: Oropharynx is clear and moist. No oropharyngeal exudate.  Eyes: Conjunctivae and EOM are normal. Pupils are equal, round, and reactive to light. No scleral icterus.  Neck: No thyromegaly present.  Cardiovascular: Normal rate, regular rhythm, normal heart sounds and intact distal pulses.  No murmur heard. Pulmonary/Chest: Effort normal and breath sounds normal. No respiratory distress. She has no wheezes. She has no rales.  Abdominal: Soft. Bowel sounds are normal. She exhibits no distension and no mass. There is no tenderness. There is no guarding.  Musculoskeletal: She exhibits no edema.  Lymphadenopathy:    She has no cervical adenopathy.  Neurological: She is alert and oriented to person, place, and time. She has normal reflexes. No cranial nerve deficit.  Skin: Skin is warm and dry. No rash noted. She is not diaphoretic. No erythema. No pallor.     LABORATORY DATA: I have personally reviewed the data as listed: Appointment on 11/23/2017  Component Date Value Ref Range Status  . WBC Count 11/23/2017 5.4  3.9 - 10.3 K/uL Final  . RBC 11/23/2017 4.44  3.70 - 5.45 MIL/uL Final  . Hemoglobin 11/23/2017 13.5  11.6 - 15.9 g/dL Final  . HCT 11/23/2017 39.6  34.8 - 46.6 % Final  . MCV 11/23/2017 89.2  79.5 - 101.0 fL Final  . MCH 11/23/2017 30.4  25.1 - 34.0 pg Final  . MCHC 11/23/2017 34.1  31.5 - 36.0 g/dL Final  . RDW 11/23/2017 12.7  11.2 - 14.5 % Final  . Platelet Count 11/23/2017 212  145 - 400 K/uL Final  . Neutrophils Relative % 11/23/2017 58  % Final  . Neutro Abs 11/23/2017 3.2  1.5 - 6.5 K/uL Final  . Lymphocytes Relative 11/23/2017 29  % Final  . Lymphs Abs 11/23/2017 1.6  0.9 - 3.3 K/uL Final  .  Monocytes Relative 11/23/2017 10  % Final  . Monocytes Absolute 11/23/2017 0.5  0.1 - 0.9 K/uL Final  . Eosinophils Relative 11/23/2017 2  % Final  . Eosinophils Absolute 11/23/2017 0.1  0.0 - 0.5 K/uL Final  . Basophils  Relative 11/23/2017 1  % Final  . Basophils Absolute 11/23/2017 0.0  0.0 - 0.1 K/uL Final   Performed at [facility], [place] [address], [place], [place] [address]  . Sodium 11/23/2017 137  136 - 145 mmol/L Final  . Potassium 11/23/2017 4.3  3.5 - 5.1 mmol/L Final  . Chloride 11/23/2017 105  98 - 109 mmol/L Final  . CO2 11/23/2017 24  22 - 29 mmol/L Final  . Glucose, Bld 11/23/2017 107  70 - 140 mg/dL Final  . BUN 11/23/2017 15  7 - 26 mg/dL Final  . Creatinine 11/23/2017 0.73  0.60 - 1.10 mg/dL Final  . Calcium 11/23/2017 9.6  8.4 - 10.4 mg/dL Final  . Total Protein 11/23/2017 7.6  6.4 - 8.3 g/dL Final  . Albumin 11/23/2017 4.0  3.5 - 5.0 g/dL Final  . AST 11/23/2017 17  5 - 34 U/L Final  . ALT 11/23/2017 20  0 - 55 U/L Final  . Alkaline Phosphatase 11/23/2017 68  40 - 150 U/L Final  . Total Bilirubin 11/23/2017 0.6  0.2 - 1.2 mg/dL Final  . GFR, Est Non Af Am 11/23/2017 >60  >60 mL/min Final  . GFR, Est AFR Am 11/23/2017 >60  >60 mL/min Final   Comment: (NOTE) The eGFR has been calculated using the CKD EPI equation. This calculation has not been validated in all clinical situations. eGFR's persistently <60 mL/min signify possible Chronic Kidney Disease.   Georgiann Hahn gap 11/23/2017 8  3 - 11 Final   Performed at [facility], [place] [address], [place], [place] [address]  . Vitamin B-12 11/23/2017 571  180 - 914 pg/mL Final   Comment: (NOTE) This assay is not validated for testing neonatal or myeloproliferative syndrome specimens for Vitamin B12 levels. Performed at [facility], [place] [address], [place], [place] [address]   . Methylmalonic Acid, Quantitative 11/23/2017 424* 0 - 378 nmol/L Final  . Disclaimer: 11/23/2017 Comment   Final   Comment: (NOTE) This test was developed and its performance characteristics determined by [facility]. It has not been cleared or approved by the Food and Drug Administration. Performed At: [facility] [place], [place] [id number] Rush Farmer MD [id number] Performed at [facility], [place] [address] [place], [place] [address]   . IgG (Immunoglobin G), Serum 11/23/2017 1,480  700 - 1,600 mg/dL Final  . IgA 11/23/2017 104  64 - 422 mg/dL Final  . IgM (Immunoglobulin M), Srm 11/23/2017 63  26 - 217 mg/dL Final  . Total Protein ELP 11/23/2017 7.0  6.0 - 8.5 g/dL Corrected  . Albumin SerPl Elph-Mcnc 11/23/2017 3.8  2.9 - 4.4 g/dL Corrected  . Alpha 1 11/23/2017 0.2  0.0 - 0.4 g/dL Corrected  . Alpha2 Glob SerPl Elph-Mcnc 11/23/2017 0.7  0.4 - 1.0 g/dL Corrected  . B-Globulin SerPl Elph-Mcnc 11/23/2017 1.0  0.7 - 1.3 g/dL Corrected  . Gamma Glob SerPl Elph-Mcnc 11/23/2017 1.4  0.4 - 1.8 g/dL Corrected  . M Protein SerPl Elph-Mcnc 11/23/2017 0.9* Not Observed g/dL Corrected  . Globulin, Total 11/23/2017 3.2  2.2 - 3.9 g/dL Corrected  . Albumin/Glob SerPl 11/23/2017 1.2  0.7 - 1.7 Corrected  .  IFE 1 11/23/2017 Comment   Corrected   Comment: (NOTE) Immunofixation shows IgG monoclonal protein with lambda light chain specificity.   . Please Note 11/23/2017 Comment   Corrected   Comment: (NOTE) Protein electrophoresis scan will follow via computer, mail, or courier delivery. Performed At: [facility] [place], [place] [id number] Rush Farmer MD [id number] Performed at [facility], [place] [address] [place], [place] [address]   . Kappa free light chain 11/23/2017 13.8  3.3 - 19.4 mg/L Final  . Lamda free light chains 11/23/2017 15.0  5.7 - 26.3 mg/L Final  . Kappa, lamda light chain ratio 11/23/2017 0.92  0.26 - 1.65 Final   Comment: (NOTE) Performed At: [facility] [place], [place] [id number] Rush Farmer MD [id number] Performed at [facility], [place] [address], [place], [place] [address]   . Viscosity, Serum 11/23/2017  2.0* 1.6 - 1.9 rel.saline Final   Comment: (NOTE) Values above 2.7 may indicate paraproteinemia is present. This test was developed and its performance characteristics determined by [facility]. It has not been cleared or approved by the Food and Drug Administration. Performed At: [facility] [place], [place] [id number] Rush Farmer MD [id number] Performed at [facility], [place] [address], [place], [place] [address]   . Beta-2 Microglobulin 11/23/2017 2.3  0.6 - 2.4 mg/L Final   Comment: (NOTE) Siemens Immulite 2000 Immunochemiluminometric assay (ICMA) Values obtained with different assay methods or kits cannot be used interchangeably. Results cannot be interpreted as absolute evidence of the presence or absence of malignant disease. Performed At: [facility] [place], [place] [id number] Rush Farmer MD [id number] Performed at [facility], [place] [address] [place], [place] [address]   . LDH 11/23/2017 170  125 - 245 U/L Final   Performed at [facility], [place] [address], [place], [place] [address]         Ardath Sax, MD

## 2017-12-07 ENCOUNTER — Inpatient Hospital Stay (HOSPITAL_BASED_OUTPATIENT_CLINIC_OR_DEPARTMENT_OTHER): Payer: Medicare Other | Admitting: Hematology and Oncology

## 2017-12-07 ENCOUNTER — Other Ambulatory Visit: Payer: Self-pay

## 2017-12-07 ENCOUNTER — Telehealth: Payer: Self-pay | Admitting: Hematology and Oncology

## 2017-12-07 VITALS — BP 164/71

## 2017-12-07 DIAGNOSIS — Z87891 Personal history of nicotine dependence: Secondary | ICD-10-CM

## 2017-12-07 DIAGNOSIS — D472 Monoclonal gammopathy: Secondary | ICD-10-CM

## 2017-12-07 DIAGNOSIS — M129 Arthropathy, unspecified: Secondary | ICD-10-CM | POA: Diagnosis not present

## 2017-12-07 DIAGNOSIS — E039 Hypothyroidism, unspecified: Secondary | ICD-10-CM

## 2017-12-07 DIAGNOSIS — G629 Polyneuropathy, unspecified: Secondary | ICD-10-CM | POA: Diagnosis not present

## 2017-12-07 DIAGNOSIS — E7800 Pure hypercholesterolemia, unspecified: Secondary | ICD-10-CM

## 2017-12-07 DIAGNOSIS — M4800 Spinal stenosis, site unspecified: Secondary | ICD-10-CM | POA: Diagnosis not present

## 2017-12-07 DIAGNOSIS — M1990 Unspecified osteoarthritis, unspecified site: Secondary | ICD-10-CM | POA: Diagnosis not present

## 2017-12-07 DIAGNOSIS — Z7982 Long term (current) use of aspirin: Secondary | ICD-10-CM

## 2017-12-07 DIAGNOSIS — K219 Gastro-esophageal reflux disease without esophagitis: Secondary | ICD-10-CM

## 2017-12-07 DIAGNOSIS — R531 Weakness: Secondary | ICD-10-CM

## 2017-12-07 DIAGNOSIS — R5383 Other fatigue: Secondary | ICD-10-CM

## 2017-12-07 DIAGNOSIS — Z79899 Other long term (current) drug therapy: Secondary | ICD-10-CM

## 2017-12-07 DIAGNOSIS — I10 Essential (primary) hypertension: Secondary | ICD-10-CM

## 2017-12-07 NOTE — Telephone Encounter (Signed)
Scheduled appt per 3/18 los - Gave patient AVS and calender per los.  

## 2017-12-14 ENCOUNTER — Encounter: Payer: Self-pay | Admitting: Hematology and Oncology

## 2017-12-14 NOTE — Assessment & Plan Note (Addendum)
84 y.o. 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  Plan discussed with the patient and her family who voiced agreement with skipping bone marrow biopsy at this point in time.  Repeat lab work obtained at last visit to the clinic confirms presence of a small monoclonal gammopathy with IgG lambda without distortion of the M the ratio.  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  With that in mind, considering her advanced age, I do not feel that obtaining bone marrow biopsy would change her current management.  Plan: -Initiate observation. -Continue vitamin B12 replacement as it appears to be helpful with patient's symptoms -Return to clinic in 4 months: Lab work one month prior to assess for potential MGUS progression, clinic visit for hematological monitoring.  We will consider reassessment with imaging and/or bone marrow biopsy on significant progression of monoclonal gammopathy or appearance of new symptoms.

## 2017-12-14 NOTE — Progress Notes (Signed)
[name]:  Assessment: MGUS (monoclonal gammopathy of unknown significance) 84 y.o. 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  Plan discussed with the patient and her family who voiced agreement with skipping bone marrow biopsy at this point in time.  Repeat lab work obtained at last visit to the clinic confirms presence of a small monoclonal gammopathy with IgG lambda without distortion of the M the ratio.  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  With that in mind, considering her advanced age, I do not feel that obtaining bone marrow biopsy would change her current management.  Plan: -Initiate observation. -Continue vitamin B12 replacement as it appears to be helpful with patient's symptoms -Return to clinic in 4 months: Lab work one month prior to assess for potential MGUS progression, clinic visit for hematological monitoring.  We will consider reassessment with imaging and/or bone marrow biopsy on significant progression of monoclonal gammopathy or appearance of new symptoms.   Voice recognition software was used and creation of this note. Despite my best effort at editing the text, some misspelling/errors may have occurred.  Orders Placed This Encounter  Procedures  . Beta 2 microglobulin, serum    Standing Status:   Future    Standing Expiration Date:   06/10/2019  . CBC & Diff and Retic    Standing Status:   Future    Standing Expiration Date:   06/10/2019  . CMP ([facility] only)    Standing Status:   Future    Standing Expiration Date:   06/10/2019  .  Kappa/lambda light chains    Standing Status:   Future    Standing Expiration Date:   06/10/2019  . Lactate dehydrogenase    Standing Status:   Future    Standing Expiration Date:   06/10/2019  . SPEP & IFE with QIG    Standing Status:   Future    Standing Expiration Date:   06/10/2019  . UPEP/UIFE/Light Chains/TP, 24-Hr Ur    Standing Status:   Future    Standing Expiration Date:   06/10/2019    Cancer Staging No matching staging information was found for the patient.  All questions were answered.  . The patient knows to call the clinic with any problems, questions or concerns.  This note was electronically signed.    History of Presenting Illness Carol Woods is an 84 y.o. female followed in the [facility] for evaluation of monoclonal gammopathy of unknown significance, referred by Dr Shon Baton.  Please see hematological history below for details of available lab work.  Past medical history significant for hypothyroidism, hypertension, osteoarthritis, lumbar spinal stenosis, and GERD.  Patient  is a former smoker, quit in 1977.  Patient does drink occasional alcohol.  At the present time, patient does complain of neuropathy with tingling in bilateral upper and lower extremities.  She does complain of fatigue and used to take naps throughout the day.  Recently, patient was started on B12 supplementation and reports significant improvement to resolution in fatigue and mental fog that she has been complaining of previously.  Patient  denies any fevers, chills, night sweats.  Denies any facial or extremity weakness.  No dysuria, hematuria, constipation, diarrhea.  No significant recent changes in the musculoskeletal complaints.  Oncological/hematological History: --Labs, 11/20/17:                  SPEP -- M-Spike 1.0g/dL, SIFE -- IgG lambda; IgG 1684, IgA 117, IgM 69; UPEP & UIFE -- no abnormal protein findings; Vit B12 327, Folate RBC 998 --Labs, 11/23/17: tProt 7.6, Alb 4.0, Ca  9.6, Cr 0.7, AP 68, LDH 170, beta-2 microglobulin 2.0; SPEP -- M-Spike 0.9g/dL, SIFE -- IgG lambda; IgG 1480, IgA 104, IgM 63; kappa13.8, lambda 15.0, KLR 0.92(wnl); WBC 5.4, Hgb 13.5, Plt 212 --Skeletal Survey, 12/01/17: No lytic skeletal lesions.   No history exists.    Medical History: Past Medical History:  Diagnosis Date  . Arthritis    "back; shoulders" (08/31/2014)  . Cataracts, both eyes   . GERD (gastroesophageal reflux disease)    OTC, changed diet  . High cholesterol   . Hypertension   . Hypothyroidism     Surgical History: Past Surgical History:  Procedure Laterality Date  . ANTERIOR CERVICAL DECOMP/DISCECTOMY FUSION  02/2010   Dr Ellene Route  . BACK SURGERY    . COLONOSCOPY W/ POLYPECTOMY    . DILATION AND CURETTAGE OF UTERUS    . POSTERIOR LUMBAR FUSION  04/2010   Dr Ellene Route  . TONSILLECTOMY     age 43  . TOTAL SHOULDER ARTHROPLASTY Right 08/31/2014  . TOTAL SHOULDER ARTHROPLASTY Right 08/31/2014   Procedure: TOTAL SHOULDER ARTHROPLASTY;  Surgeon: Nita Sells, MD;  Location: [facility];  Service: Orthopedics;  Laterality: Right;  Right total shoulder arthroplasty    Family History: No family history on file.  Social History: Social History   Socioeconomic History  . Marital status: Divorced    Spouse name: Not on file  . Number of children: Not on file  . Years of education: Not on file  . Highest education level: Not on file  Occupational History  . Not on file  Social Needs  . Financial resource strain: Not on file  . Food insecurity:    Worry: Not on file    Inability: Not on file  . Transportation needs:    Medical: Not on file    Non-medical: Not on file  Tobacco Use  . Smoking status: Former Smoker    Packs/day: 1.50    Years: 25.00    Pack years: 37.50    Types: Cigarettes    Last attempt to quit: 08/22/1984    Years since quitting: 33.3  . Smokeless tobacco: Never Used  Substance and Sexual Activity  . Alcohol use: Yes     Alcohol/week: 7.2 oz    Types: 12 Glasses of wine per week  . Drug use: No  . Sexual activity: Not on file  Lifestyle  . Physical activity:    Days per week: Not on file    Minutes per session: Not on file  . Stress: Not on file  Relationships  . Social connections:    Talks on phone: Not on file    Gets together: Not on file    Attends religious service: Not on file    Active member of club or organization: Not on file    Attends meetings of clubs or organizations: Not on file    Relationship status: Not on file  . Intimate partner violence:    Fear of current or ex partner:  Not on file    Emotionally abused: Not on file    Physically abused: Not on file    Forced sexual activity: Not on file  Other Topics Concern  . Not on file  Social History Narrative  . Not on file    Allergies: No Known Allergies  Medications:  Current Outpatient Medications  Medication Sig Dispense Refill  . aspirin EC 81 MG tablet Take 81 mg by mouth daily.    . brimonidine (ALPHAGAN) 0.2 % ophthalmic solution Place 1 drop into the right eye 2 (two) times daily.    Marland Kitchen CALCIUM PO Take 1 tablet by mouth daily.    . Cholecalciferol (VITAMIN D PO) Take 1 tablet by mouth daily.    Marland Kitchen levothyroxine (SYNTHROID) 125 MCG tablet Take 125 mcg by mouth daily before breakfast.    . lisinopril (PRINIVIL,ZESTRIL) 20 MG tablet Take 20 mg by mouth daily.    . metroNIDAZOLE (METROGEL) 0.75 % gel Apply 1 application topically daily.    . Omega-3 Fatty Acids (FISH OIL PO) Take 1 capsule by mouth daily.    . psyllium (METAMUCIL SMOOTH TEXTURE) 28 % packet Take 1 packet by mouth daily.     No current facility-administered medications for this visit.     Review of Systems: Review of Systems  Constitutional: Positive for fatigue.  Neurological: Positive for numbness.  All other systems reviewed and are negative.    PHYSICAL EXAMINATION Blood pressure (!) 164/71.  ECOG PERFORMANCE STATUS: 2 - Symptomatic, <50%  confined to bed  Physical Exam  Constitutional: She is oriented to person, place, and time and well-developed, well-nourished, and in no distress. No distress.  HENT:  Head: Normocephalic and atraumatic.  Mouth/Throat: Oropharynx is clear and moist. No oropharyngeal exudate.  Eyes: Pupils are equal, round, and reactive to light. Conjunctivae and EOM are normal. No scleral icterus.  Neck: No thyromegaly present.  Cardiovascular: Normal rate, regular rhythm, normal heart sounds and intact distal pulses.  No murmur heard. Pulmonary/Chest: Effort normal and breath sounds normal. No respiratory distress. She has no wheezes. She has no rales.  Abdominal: Soft. Bowel sounds are normal. She exhibits no distension and no mass. There is no tenderness. There is no guarding.  Musculoskeletal: She exhibits no edema.  Lymphadenopathy:    She has no cervical adenopathy.  Neurological: She is alert and oriented to person, place, and time. She has normal reflexes. No cranial nerve deficit.  Skin: Skin is warm and dry. No rash noted. She is not diaphoretic. No erythema. No pallor.     LABORATORY DATA: I have personally reviewed the data as listed: No visits with results within 1 Week(s) from this visit.  Latest known visit with results is:  Appointment on 11/27/2017  Component Date Value Ref Range Status  . Free Kappa Lt Chains,Ur 11/27/2017 11.60  1.35 - 24.19 mg/L Final  . FR KAPPA LT CH,24HR 11/27/2017 15  Undefined mg/24 hr Final  . Free Lambda Lt Chains,Ur 11/27/2017 1.37  0.24 - 6.66 mg/L Final   **Results verified by repeat testing**  . FR LAMBDA LT CH,24HR 11/27/2017 2  Undefined mg/24 hr Final  . Free Kappa/Lambda Ratio 11/27/2017 8.47  2.04 - 10.37 Final  . Immunofixation, Urine 11/27/2017 Comment   Final   An apparent normal immunofixation pattern.  . Total Protein, Urine 11/27/2017 5.0  Not Estab. mg/dL Final  . Total Protein, Urine-Ur/day 11/27/2017 63  30 - 150 mg/24 hr Final    Comment: (NOTE) Performed At:  [facility] [place], [place] [id number] Rush Farmer MD IO:[id number]   . Total Volume 11/27/2017 1,250   Final   Performed at [facility], [place] [address] [place], [place] [address]       Ardath Sax, MD

## 2018-02-04 ENCOUNTER — Encounter: Payer: Self-pay | Admitting: Neurology

## 2018-02-04 ENCOUNTER — Telehealth: Payer: Self-pay | Admitting: Neurology

## 2018-02-04 ENCOUNTER — Ambulatory Visit: Payer: Medicare Other | Admitting: Neurology

## 2018-02-04 VITALS — BP 155/82 | HR 68 | Ht 61.0 in | Wt 133.0 lb

## 2018-02-04 DIAGNOSIS — R269 Unspecified abnormalities of gait and mobility: Secondary | ICD-10-CM | POA: Diagnosis not present

## 2018-02-04 DIAGNOSIS — R202 Paresthesia of skin: Secondary | ICD-10-CM

## 2018-02-04 DIAGNOSIS — G3281 Cerebellar ataxia in diseases classified elsewhere: Secondary | ICD-10-CM | POA: Diagnosis not present

## 2018-02-04 NOTE — Telephone Encounter (Signed)
pt wanted open mri-[employer] medicare auth: NPR oder faxed to [facility]. They will reach out to the pt to schedule.

## 2018-02-04 NOTE — Progress Notes (Signed)
PATIENT: Carol Woods DOB: November 11, 1933  Chief Complaint  Patient presents with  . New Patient (Initial Visit)    PCP: Dr. Shon Baton. Patient is alone.      HISTORICAL  Carol Woods is a 84 year old female, seen in refer by her primary care physician Dr. Virgina Jock, Carol Woods for evaluation of peripheral neuropathy, gammopathy of unknown clinical significance, initial evaluation was on Feb 04, 2018.  I have reviewed and summarized the referring note, she has history of hypertension, hypothyroidism, on thyroid supplement,  Around 2010, she developed gradual worsening neck pain, low back pain, bilateral lower extremity paresthesia, gait abnormality, MRI of cervical spine at that time showed multifactorial cervical spinal stenosis with mass-effect on the spinal C4-5 through C6-7, no spinal cord signal abnormality, diffuse bilateral cervical moderate and severe neuroforaminal stenosis, MRI lumbar spine showed large sequestered disc fragment posterior to the L3 vertebral bodies in the right posterior lateral region, with compression of the descending L3 nerve roots in the lateral recess, progressive grade 1 anterolisthesis of L2 on L3, with multifactorial severe central and lateral recess stenosis, moderate central canal stenosis at L4-5,  She eventually underwent cervical decompression followed by lumbar decompression surgery, which did help her neck, low back pain, stabilize her worsening gait abnormality,  Post surgical CT myelogram in June 2012, showed fusion of L2-5, minimum excessive motion at L1-2 level, between flexion and extension, posterior fusion C3 7 with anterior plate and screws in place, loosening of right C5 and the post C7 screws, subsiding of the interbody spacer at the C6-7 level without bony fusion,  CT myelogram of thoracic spine showed moderate partially calcified disc protrusion at T9-10, left paracentral position, causing cord compression, she was given a suggestion of  thoracic decompression surgery, but she declined at that time.  Over the years, she continue to manage with her worsening gait abnormality, noticed significant worsening since 2017, now she has increased bilateral toes numbness, intermittent shooting pain to her toes, since 2019, she also noticed worsening bilateral fingertips numbness tingling, when she touch her skin with her fingers, she felt like scale, drop things from her left hand, she also has chronic bilateral shoulder pain, had a history of right shoulder replacement in the past.  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 Lebron Conners, will continue monitoring her  She denies a history of bowel bladder incontinence,  Laboratory evaluations in March 2019: Protein , M spike, immunofixation shows IgG monoclonal protein with lambda light chain specificity.  Normal lactate dehydrogenase mildly increased serum viscosity mildly elevated methylmalonic acid level 424, normal CMP, CBC, B12 551, 4 hours urine immuno fixative electrophoresis showed no significant abnormality.  REVIEW OF SYSTEMS: Full 14 system review of systems performed and notable only for numbness, weakness, feeling cold, joint pain, cramps, decreased energy, racing thoughts, diarrhea  ALLERGIES: No Known Allergies  HOME MEDICATIONS: Current Outpatient Medications  Medication Sig Dispense Refill  . aspirin EC 81 MG tablet Take 81 mg by mouth daily.    Marland Kitchen b complex vitamins capsule Take 1 capsule by mouth daily.    Marland Kitchen CALCIUM PO Take 1 tablet by mouth daily.    . Cholecalciferol (VITAMIN D PO) Take 1 tablet by mouth daily.    . clindamycin (CLEOCIN T) 1 % external solution Apply  topically 2 (two) times daily.    . cyanocobalamin 1000 MCG tablet Take  1,000 mcg by mouth daily.    Marland Kitchen levothyroxine (SYNTHROID) 125 MCG tablet Take 125 mcg by mouth daily before breakfast.    . lisinopril (PRINIVIL,ZESTRIL) 20 MG tablet Take 20 mg by mouth daily.    . Omega-3 Fatty Acids (FISH OIL PO) Take 1 capsule by mouth daily.     No current facility-administered medications for this visit.     PAST MEDICAL HISTORY: Past Medical History:  Diagnosis Date  . Arthritis    "back; shoulders" (08/31/2014)  . Cataracts, both eyes   . GERD (gastroesophageal reflux disease)    OTC, changed diet  . High cholesterol   . Hypertension   . Hypothyroidism     PAST SURGICAL HISTORY: Past Surgical History:  Procedure Laterality Date  . ANTERIOR CERVICAL DECOMP/DISCECTOMY FUSION  02/2010   Dr Ellene Route  . BACK SURGERY    . COLONOSCOPY W/ POLYPECTOMY    . DILATION AND CURETTAGE OF UTERUS    . POSTERIOR LUMBAR FUSION  04/2010   Dr Ellene Route  . TONSILLECTOMY     age 37  . TOTAL SHOULDER ARTHROPLASTY Right 08/31/2014  . TOTAL SHOULDER ARTHROPLASTY Right 08/31/2014   Procedure: TOTAL SHOULDER ARTHROPLASTY;  Surgeon: Nita Sells, MD;  Location: [facility];  Service: Orthopedics;  Laterality: Right;  Right total shoulder arthroplasty    FAMILY HISTORY: History reviewed. No pertinent family history.  SOCIAL HISTORY:  Social History   Socioeconomic History  . Marital status: Divorced    Spouse name: Not on file  . Number of children: Not on file  . Years of education: Not on file  . Highest education level: Not on file  Occupational History  . Not on file  Social Needs  . Financial resource strain: Not on file  . Food insecurity:    Worry: Not on file    Inability: Not on file  . Transportation needs:    Medical: Not on file    Non-medical: Not on file  Tobacco Use  . Smoking status: Former Smoker    Packs/day: 1.50    Years: 25.00    Pack years: 37.50    Types: Cigarettes    Last attempt to quit: 08/22/1984    Years since quitting: 33.4  .  Smokeless tobacco: Never Used  Substance and Sexual Activity  . Alcohol use: Yes    Alcohol/week: 7.2 oz    Types: 12 Glasses of wine per week  . Drug use: No  . Sexual activity: Not on file  Lifestyle  . Physical activity:    Days per week: Not on file    Minutes per session: Not on file  . Stress: Not on file  Relationships  . Social connections:    Talks on phone: Not on file    Gets together: Not on file    Attends religious service: Not on file    Active member of club or organization: Not on file    Attends meetings of clubs or organizations: Not on file    Relationship status: Not on file  . Intimate partner violence:    Fear of current or ex partner: Not on file    Emotionally abused: Not on file    Physically abused: Not on file    Forced sexual activity: Not on file  Other Topics Concern  . Not on file  Social History Narrative  . Not on file  PHYSICAL EXAM   Vitals:   02/04/18 1117  BP: (!) 155/82  Pulse: 68  Weight: 133 lb (60.3 kg)  Height: 5\' 1"  (1.549 m)    Not recorded      Body mass index is 25.13 kg/m.  PHYSICAL EXAMNIATION:  Gen: NAD, conversant, well nourised, obese, well groomed                     Cardiovascular: Regular rate rhythm, no peripheral edema, warm, nontender. Eyes: Conjunctivae clear without exudates or hemorrhage Neck: Supple, no carotid bruits. Pulmonary: Clear to auscultation bilaterally   NEUROLOGICAL EXAM:  MENTAL STATUS: Speech:    Speech is normal; fluent and spontaneous with normal comprehension.  Cognition:     Orientation to time, place and person     Normal recent and remote memory     Normal Attention span and concentration     Normal Language, naming, repeating,spontaneous speech     Fund of knowledge   CRANIAL NERVES: CN II: Visual fields are full to confrontation. Fundoscopic exam is normal with sharp discs and no vascular changes. Pupils are round equal and briskly reactive to light. CN III, IV,  VI: extraocular movement are normal. No ptosis. CN V: Facial sensation is intact to pinprick in all 3 divisions bilaterally. Corneal responses are intact.  CN VII: Face is symmetric with normal eye closure and smile. CN VIII: Hearing is normal to rubbing fingers CN IX, X: Palate elevates symmetrically. Phonation is normal. CN XI: Head turning and shoulder shrug are intact CN XII: Tongue is midline with normal movements and no atrophy.  MOTOR: Skin looks shiny at bilateral distal leg, mild bilateral finger abduction weakness, mild bilateral hip flexion weakness, mild to moderate bilateral ankle dorsiflexion plantar flexion weakness, left worse than right  REFLEXES: Reflexes are 2+ and symmetric at the biceps, triceps, absent at knees, and ankles. Plantar responses are flexor.  SENSORY: Length dependent decreased to light touch, vibratory sensation, pinprick to bilateral knee level.  COORDINATION: Rapid alternating movements and fine finger movements are intact. There is no dysmetria on finger-to-nose and heel-knee-shin.    GAIT/STANCE: She needs assistance to get up from seated position, stiff, unsteady gait, also with bilateral flailed feet, could not stand up on tiptoes or heels, DIAGNOSTIC DATA (LABS, IMAGING, TESTING) - I reviewed patient records, labs, notes, testing and imaging myself where available.   ASSESSMENT AND PLAN  EKTA DANCER is a 84 y.o. 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 Ph.D.  [facility] [address] [place], [place] [address] Ph: [phone] Fax: [phone]  PP:IRJJO, Carol Reichmann, MD

## 2018-02-05 ENCOUNTER — Ambulatory Visit (INDEPENDENT_AMBULATORY_CARE_PROVIDER_SITE_OTHER): Payer: Medicare Other | Admitting: Neurology

## 2018-02-05 ENCOUNTER — Encounter (INDEPENDENT_AMBULATORY_CARE_PROVIDER_SITE_OTHER): Payer: Medicare Other | Admitting: Neurology

## 2018-02-05 DIAGNOSIS — Z0289 Encounter for other administrative examinations: Secondary | ICD-10-CM

## 2018-02-05 DIAGNOSIS — R202 Paresthesia of skin: Secondary | ICD-10-CM

## 2018-02-05 DIAGNOSIS — R269 Unspecified abnormalities of gait and mobility: Secondary | ICD-10-CM

## 2018-02-05 DIAGNOSIS — G3281 Cerebellar ataxia in diseases classified elsewhere: Secondary | ICD-10-CM

## 2018-02-05 NOTE — Procedures (Signed)
Full Name: Carol Woods Gender: Female MRN #: [id number] Date of Birth: 2034-07-25    Visit Date: 02/05/18 11:33 Age: 84 Years 63 Months Old Examining Physician: Jannifer Franklin, MD Referring Physician: Krista Blue, MD History:  84 years old female, with history of cervical, lumbar decompression surgery, now complains of worsening gait abnormality, bilateral upper and lower extremity paresthesia  Summary of the tests:  Nerve conduction study: Bilateral sural, superficial peroneal sensory responses showed mildly decreased snap amplitude.  Left median, ulnar sensory and motor responses were normal.  Left peroneal to EDB motor responses were absent.  Right peroneal to EDB and left tibial motor responses showed severely decreased the C map amplitude.  Right tibial motor responses showed mildly decreased C map amplitude, with mildly prolonged distal latency, moderately decreased conduction velocity.  Electromyography: Selective needle examinations of bilateral lower extremity muscles, bilateral lumbosacral paraspinal muscles, left upper extremity muscles, and left cervical paraspinal muscles were performed.  There is electrodiagnostic evidence of widespread active neuropathic changes involving bilateral L4, 5, and milder degree S1 myotomes.  There is also evidence of active denervation along bilateral lower lumbar paraspinal muscles.  There is evidence of chronic neuropathic changes involving left cervical C5, 6, 7, and T1 myotomes, mostly involving left lower cervical spine myotomes, with evidence of mild chronic neuropathic changes at left cervical paraspinal muscles.    Conclusion: This is an abnormal study.  There is electrodiagnostic evidence of active bilateral lumbosacral radiculopathies, mainly involving bilateral L4, L5 more than S1 myotomes.  In addition, there is also evidence of chronic left cervical radiculopathy, mainly involved left T1, T7 more than C5 and  6.    ------------------------------- Physician Name, M.D.  [facility] [place], [place] [address] Tel: [phone] Fax: [phone]        [facility]    Nerve / Sites Muscle Latency Ref. Amplitude Ref. Rel Amp Segments Distance Velocity Ref. Area    ms ms mV mV %  cm m/s m/s mVms  L Median - APB     Wrist APB 3.7 ?4.4 5.8 ?4.0 100 Wrist - APB 7   19.7     Upper arm APB 7.5  6.0  104 Upper arm - Wrist 20 53 ?49 21.4  L Ulnar - ADM     Wrist ADM 2.9 ?3.3 6.3 ?6.0 100 Wrist - ADM 7   14.4     B.Elbow ADM 6.0  5.3  84.4 B.Elbow - Wrist 16 52 ?49 13.0     A.Elbow ADM 7.8  5.0  95.1 A.Elbow - B.Elbow 10 55 ?49 13.0         A.Elbow - Wrist      L Peroneal - EDB     Ankle EDB NR ?6.5 NR ?2.0 NR Ankle - EDB 9   NR     Fib head EDB NR  NR  NR Fib head - Ankle 24 NR ?44 NR         Pop fossa - Ankle      R Peroneal - EDB     Ankle EDB 6.1 ?6.5 0.6 ?2.0 100 Ankle - EDB 9   1.3     Fib head EDB 13.1  0.6  102 Fib head - Ankle 28 40 ?44 1.2     Pop fossa EDB 15.6  0.6  94.8 Pop fossa - Fib head 10 41 ?44 1.1         Pop fossa - Ankle  L Tibial - AH     Ankle AH 9.4 ?5.8 0.7 ?4.0 100 Ankle - AH 9   1.0     Pop fossa AH 19.4  0.4  56.3 Pop fossa - Ankle 32 32 ?41 0.9  R Tibial - AH     Ankle AH 6.2 ?5.8 1.7 ?4.0 100 Ankle - AH 9   2.9     Pop fossa AH 15.6  1.3  75.8 Pop fossa - Ankle 32 34 ?41 2.5                 SNC    Nerve / Sites Rec. Site Peak Lat Ref.  Amp Ref. Segments Distance Peak Diff Ref.    ms ms V V  cm ms ms  L Sural - Ankle (Calf)     Calf Ankle 4.3 ?4.4 3 ?6 Calf - Ankle 14    R Sural - Ankle (Calf)     Calf Ankle 3.9 ?4.4 2 ?6 Calf - Ankle 14    L Superficial peroneal - Ankle     Lat leg Ankle 4.9 ?4.4 2 ?6 Lat leg - Ankle 14    R Superficial peroneal - Ankle     Lat leg Ankle 4.5 ?4.4 3 ?6 Lat leg - Ankle 14    L Median, Ulnar - Transcarpal comparison     Median Palm Wrist 2.0 ?2.2 27 ?35 Median Palm - Wrist 8       Ulnar Palm  Wrist 2.2 ?2.2 8 ?12 Ulnar Palm - Wrist 8          Median Palm - Ulnar Palm  -0.2 ?0.4  L Median - Orthodromic (Dig II, Mid palm)     Dig II Wrist 3.2 ?3.4 10 ?10 Dig II - Wrist 13    L Ulnar - Orthodromic, (Dig V, Mid palm)     Dig V Wrist 2.8 ?3.1 5 ?5 Dig V - Wrist 6                       F  Wave    Nerve F Lat Ref.   ms ms  L Tibial - AH 62.7 ?56.0  R Tibial - AH 68.3 ?56.0  L Ulnar - ADM 26.5 ?32.0           EMG full       EMG Summary Table    Spontaneous MUAP Recruitment  Muscle IA Fib PSW Fasc Other Amp Dur. Poly Pattern  R. Tibialis anterior Increased 2+ 2+ None _______ Increased Increased 1+ Reduced  R. Tibialis posterior Increased 1+ 1+ None _______ Increased Increased 1+ Reduced  R. Peroneus longus Increased 1+ None None CRDs Increased Increased 1+ Reduced  R. Gastrocnemius (Medial head) Increased 1+ 1+ None _______ Increased Increased 1+ Reduced  L. Tibialis anterior Increased 2+ 2+ None _______ Increased Increased 1+ Reduced  L. Tibialis posterior Increased 1+ None None _______ Increased Increased 1+ Reduced  L. Peroneus longus Increased 1+ None None _______ Increased Increased 1+ Reduced  L. Gastrocnemius (Medial head) Increased 1+ None None _______ Increased Increased 1+ Reduced  R. Vastus lateralis Normal None None None _______ Increased Increased 1+ Reduced  L. Vastus lateralis Normal None None None _______ Increased Increased 1+ Reduced  L. Biceps femoris (long head) Normal None None None _______ Increased Increased 1+ Reduced  R. Biceps femoris (long head) Normal None None None _______ Increased Increased 1+ Reduced  L. Lumbar paraspinals (mid) Increased None None  None _______ Increased Increased 1+ Normal  L. Lumbar paraspinals (low) Increased 1+ None None _______ Increased Increased 1+ Normal  R. Lumbar paraspinals (mid) Increased 1+ None None _______ Increased Increased 1+ Normal  R. Lumbar paraspinals (low) Increased 1+ None None _______ Increased  Increased 1+ Normal  L. Thoracic paraspinals (mid) Increased None None None _______ Increased Increased Normal Normal  L. First dorsal interosseous Increased 1+ None None _______ Increased Increased 1+ Reduced  L. Pronator teres Increased None None None _______ Increased Increased Normal Reduced  L. Biceps brachii Increased None None None _______ Increased Increased Normal Reduced  L. Deltoid Increased None None None _______ Increased Increased Normal Reduced  L. Triceps brachii Increased None None None _______ Increased Increased Normal Reduced  L. Cervical paraspinals Increased None None None _______ Increased Increased Normal Normal

## 2018-02-09 NOTE — Telephone Encounter (Signed)
Patient is scheduled at [facility] for 02/16/18.

## 2018-02-25 ENCOUNTER — Telehealth: Payer: Self-pay | Admitting: Hematology and Oncology

## 2018-02-25 NOTE — Telephone Encounter (Signed)
Called pt re appts being moved to [facility] - left vm for pt and sending out confirmation letter in the mail.

## 2018-02-28 ENCOUNTER — Other Ambulatory Visit: Payer: Medicare Other

## 2018-03-04 ENCOUNTER — Ambulatory Visit
Admission: RE | Admit: 2018-03-04 | Discharge: 2018-03-04 | Disposition: A | Discharge status: Home / Self Care | Payer: Medicare Other | Source: Ambulatory Visit | Attending: Neurology | Admitting: Neurology

## 2018-03-04 DIAGNOSIS — G3281 Cerebellar ataxia in diseases classified elsewhere: Secondary | ICD-10-CM

## 2018-03-05 ENCOUNTER — Telehealth: Payer: Self-pay | Admitting: Neurology

## 2018-03-05 NOTE — Telephone Encounter (Addendum)
Please call patient, MRI spine showed significant abnormality at thoracic, upper lumbar region, also schedule follow-up appointment with me for [place] to review MRIs films.  MRI cervical spine showed postsurgical change, there is no evidence of cervical spinal cord compression MRI of thoracic spine showed severe spinal stenosis at T9 and 10, possible left T9 nerve root compression due to left paramedian disc herniation and facet hypertrophy MRI of lumbar spine showed previous L2-5 posterior laminectomy and fusion, evidence of severe spinal stenosis at the level above previous surgical site at L1 and 2, which is new in comparison to previous scan in 2010    IMPRESSION: This MRI of the cervical spine without contrast shows the following: 1.   ACDF at C4-C5, C5-C6 and C6-C7.  There is residual mild spinal stenosis at each of these levels (severe spinal stenosis was noted on the 2011 MRI before surgery).  There is moderate left foraminal narrowing at C5-C6.  There is no definite nerve root compression. 2.   At C3-C4, there is mild spinal stenosis and mild to moderate foraminal narrowing but no nerve root compression. 3.   At C7-T1, there is mild spinal stenosis and mild foraminal narrowing but no nerve root compression. 4.   At T1-T2, there is moderate spinal stenosis, moderate left and mild right foraminal narrowing.  There does not appear to be any definite nerve root compression. 5.   There is a small hyperintense lesion within the spinal cord adjacent to C6 consistent with a small focus of myelomalacia.  IMPRESSION: This MRI of the thoracic spine shows multilevel degenerative changes as detailed above.  Most significant findings are: 1.  At T1-T2, there is mild to moderate spinal stenosis due to disc protrusion, uncovertebral spurring and reduced disc height.  There does not appear to be nerve root compression. 2.  At T9-T10, there is severe spinal stenosis due to a left paramedian disc herniation and  facet hypertrophy.  There is possible compression of the left T9nerve root 3.  At T10-T11, there is moderate spinal stenosis causing severe right foraminal narrowing with potential for right T10 nerve root compression. 4.  Milder degenerative changes at T6-T7, T7-T8, T8-T9, T11-T12 and T12-L1 do not appear to lead to spinal stenosis or nerve root compression 5.  The thoracic spinal cord appears to have normal signal.   IMPRESSION: This MRI of the lumbar spine without contrast shows the following: 1.    L2-L5 posterior laminectomy and interbody fusion with correction of the spinal stenosis and anterolisthesis noted on the 06/04/2009 MRI.  There is no nerve root compression at any of these levels. 2.    At L1-L2, there is severe spinal stenosis that has occurred since the 2010 MRI.  There is potential for compression of the left L1 and right L2 nerve roots at this level. 3.    Mild degenerative changes at T11-T12, T12-L1 and L5-S1 that do not lead to spinal stenosis or nerve root compression.

## 2018-03-08 NOTE — Telephone Encounter (Signed)
Left messages, on both home and mobile numbers, requesting a return call.

## 2018-03-08 NOTE — Telephone Encounter (Signed)
Left second messages, on both home and mobile numbers, requesting a return call.

## 2018-03-09 ENCOUNTER — Encounter: Payer: Self-pay | Admitting: *Deleted

## 2018-03-09 NOTE — Telephone Encounter (Addendum)
Left fourth messages, on both home and mobile numbers, requesting a return call.  I have also left a message on voicemail of patient's daugther, Tarren Sabree (on DPR).  Additionally, I will mail an unable to contact letter.  If patient or her daughter returns call, I can review MRI results over the phone.  The patient also needs to have a follow up scheduled with Dr. Krista Blue for further discussion of results.

## 2018-03-09 NOTE — Telephone Encounter (Signed)
I have called the patient again and left messages on both home and mobile number.  The unable to contact letter has been placed in the mail.  **If this patient calls back, please skype me to come take the call.**

## 2018-03-09 NOTE — Telephone Encounter (Signed)
I have returned the calls again.  I am unable to reach the patient at home or mobile number.  I am unable to reach her daughter at the only listed number we have on file.    **When patient or daughter calls back, please sykpe me.  If I am unable to come right away, please ask Terrence Dupont or Faith to come get me.  If for some reason that does not work, please get the best call back number due to messages being left at multiple numbers.**

## 2018-03-09 NOTE — Telephone Encounter (Signed)
Pt has returned the call to [place], she is asking for a call back

## 2018-03-09 NOTE — Telephone Encounter (Signed)
Left third messages, on both home and mobile numbers, requesting a return call.

## 2018-03-09 NOTE — Telephone Encounter (Signed)
Pt has called back to try and speak with RN Sharyn Lull, when she was informed that RN was not available she asked that RN Sharyn Lull be told that pt's daughter has reached out to her.  Pt states she will try calling RN Sharyn Lull in the morning.

## 2018-03-10 NOTE — Telephone Encounter (Signed)
Spoke to patient this morning - she is aware of her MRI results and has scheduled a follow up appointment on 03/18/18.

## 2018-03-18 ENCOUNTER — Encounter: Payer: Self-pay | Admitting: Neurology

## 2018-03-18 ENCOUNTER — Ambulatory Visit: Payer: Medicare Other | Admitting: Neurology

## 2018-03-18 VITALS — BP 170/80 | HR 80 | Ht 61.0 in | Wt 131.6 lb

## 2018-03-18 DIAGNOSIS — M4804 Spinal stenosis, thoracic region: Secondary | ICD-10-CM

## 2018-03-18 DIAGNOSIS — R269 Unspecified abnormalities of gait and mobility: Secondary | ICD-10-CM | POA: Diagnosis not present

## 2018-03-18 DIAGNOSIS — M48061 Spinal stenosis, lumbar region without neurogenic claudication: Secondary | ICD-10-CM | POA: Diagnosis not present

## 2018-03-18 NOTE — Progress Notes (Signed)
PATIENT: Carol Woods DOB: Jun 03, 1934  Chief Complaint  Patient presents with  . Follow-up    gait instability, doing fairly well  . Follow-up    PCP: Dr. Virgina Jock     HISTORICAL  Carol Woods is a 84 year old female, seen in refer by her primary care physician Dr. Virgina Jock, Jenny Reichmann for evaluation of peripheral neuropathy, gammopathy of unknown clinical significance, initial evaluation was on Feb 04, 2018.  I have reviewed and summarized the referring note, she has history of hypertension, hypothyroidism, on thyroid supplement,  Around 2010, she developed gradual worsening neck pain, low back pain, bilateral lower extremity paresthesia, gait abnormality, MRI of cervical spine at that time showed multifactorial cervical spinal stenosis with mass-effect on the spinal C4-5 through C6-7, no spinal cord signal abnormality, diffuse bilateral cervical moderate and severe neuroforaminal stenosis, MRI lumbar spine showed large sequestered disc fragment posterior to the L3 vertebral bodies in the right posterior lateral region, with compression of the descending L3 nerve roots in the lateral recess, progressive grade 1 anterolisthesis of L2 on L3, with multifactorial severe central and lateral recess stenosis, moderate central canal stenosis at L4-5,  She eventually underwent cervical decompression followed by lumbar decompression surgery, which did help her neck, low back pain, stabilize her worsening gait abnormality,  Post surgical CT myelogram in June 2012, showed fusion of L2-5, minimum excessive motion at L1-2 level, between flexion and extension, posterior fusion C3 7 with anterior plate and screws in place, loosening of right C5 and the post C7 screws, subsiding of the interbody spacer at the C6-7 level without bony fusion,  CT myelogram of thoracic spine showed moderate partially calcified disc protrusion at T9-10, left paracentral position, causing cord compression, she was given a  suggestion of thoracic decompression surgery, but she declined at that time.  Over the years, she noticed mild gradual worsening gait abnormality, significant worsening since 2017, now she has increased bilateral toes numbness, intermittent shooting pain to her toes, since 2019, she also noticed worsening bilateral fingertips numbness tingling, when she touch her skin with her fingers, she felt like scale, drop things from her left hand, she also has chronic bilateral shoulder pain, had a history of right shoulder replacement in the past.  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 Lebron Conners, will continue monitoring her  She denies a history of bowel bladder incontinence,  Laboratory evaluations in March 2019: Protein , M spike, immunofixation shows IgG monoclonal protein with lambda light chain specificity.  Normal lactate dehydrogenase mildly increased serum viscosity mildly elevated methylmalonic acid level 424, normal CMP, CBC, B12 551, 4 hours urine immuno fixative electrophoresis showed no significant abnormality.  UPDATE March 18 2018: EMG nerve conduction study in May 2019 showed evidence of active bilateral lumbosacral radiculopathies, mainly involving bilateral L4, L5 more than S1 myotomes.  In addition, there is also evidence of chronic left cervical radiculopathy, mainly involved left T, C7 more than C5 and 6.  She did notice worsening gait abnormality over the past 6 months, she denies significant pain, no bowel and bladder incontinence, she lives independently, ambulate with a cane,  Personally reviewed MRI seen June 2019, MRI of thoracic spine, T9-10, evidence of severe spinal stenosis due to left paramedian disc herniation and facet hypertrophy, T10-11,  moderate spinal  stenosis, with severe right foraminal narrowing, multilevel thoracic degenerative changes MRI of lumbar, L2 5, posterior laminectomy and interbody fusion, at L1-2, severe spinal stenosis occurred since 2010, potential compression of left L1 in the right L2 nerve roots  MRI of cervical spine, ACDF at C4-5, C5-6, C6 and 7, moderate left foraminal narrowing,  REVIEW OF SYSTEMS: Full 14 system review of systems performed and notable only for as above  ALLERGIES: No Known Allergies  HOME MEDICATIONS: Current Outpatient Medications  Medication Sig Dispense Refill  . aspirin EC 81 MG tablet Take 81 mg by mouth daily.    Marland Kitchen b complex vitamins capsule Take 1 capsule by mouth daily.    Marland Kitchen CALCIUM PO Take 1 tablet by mouth daily.    . Cholecalciferol (VITAMIN D PO) Take 1 tablet by mouth daily.    . clindamycin (CLEOCIN T) 1 % external solution Apply 1 application topically daily.     . cyanocobalamin 1000 MCG tablet Take 1,000 mcg by mouth daily.    Marland Kitchen levothyroxine (SYNTHROID, LEVOTHROID) 112 MCG tablet Take 112 mcg by mouth daily before breakfast.    . lisinopril (PRINIVIL,ZESTRIL) 20 MG tablet Take 20 mg by mouth daily.    . Omega-3 Fatty Acids (FISH OIL PO) Take 1 capsule by mouth daily.     No current facility-administered medications for this visit.     PAST MEDICAL HISTORY: Past Medical History:  Diagnosis Date  . Arthritis    "back; shoulders" (08/31/2014)  . Cataracts, both eyes   . GERD (gastroesophageal reflux disease)    OTC, changed diet  . High cholesterol   . Hypertension   . Hypothyroidism     PAST SURGICAL HISTORY: Past Surgical History:  Procedure Laterality Date  . ANTERIOR CERVICAL DECOMP/DISCECTOMY FUSION  02/2010   Dr Ellene Route  . BACK SURGERY    . COLONOSCOPY W/ POLYPECTOMY    . DILATION AND CURETTAGE OF UTERUS    . POSTERIOR LUMBAR FUSION  04/2010   Dr Ellene Route  . TONSILLECTOMY     age 38  . TOTAL SHOULDER ARTHROPLASTY Right 08/31/2014  . TOTAL  SHOULDER ARTHROPLASTY Right 08/31/2014   Procedure: TOTAL SHOULDER ARTHROPLASTY;  Surgeon: Nita Sells, MD;  Location: [facility];  Service: Orthopedics;  Laterality: Right;  Right total shoulder arthroplasty    FAMILY HISTORY: No family history on file.  SOCIAL HISTORY:  Social History   Socioeconomic History  . Marital status: Divorced    Spouse name: Not on file  . Number of children: Not on file  . Years of education: Not on file  . Highest education level: Not on file  Occupational History  . Not on file  Social Needs  . Financial resource strain: Not on file  . Food insecurity:    Worry: Not on file    Inability: Not on file  . Transportation needs:    Medical: Not on file    Non-medical: Not on file  Tobacco Use  . Smoking status: Former Smoker    Packs/day: 1.50    Years: 25.00    Pack years: 37.50    Types: Cigarettes    Last attempt to quit: 08/22/1984    Years since quitting: 33.5  . Smokeless tobacco: Never Used  Substance and Sexual Activity  . Alcohol use: Yes    Alcohol/week: 7.2 oz    Types: 12 Glasses of wine per week  . Drug use: No  . Sexual activity: Not on file  Lifestyle  . Physical  activity:    Days per week: Not on file    Minutes per session: Not on file  . Stress: Not on file  Relationships  . Social connections:    Talks on phone: Not on file    Gets together: Not on file    Attends religious service: Not on file    Active member of club or organization: Not on file    Attends meetings of clubs or organizations: Not on file    Relationship status: Not on file  . Intimate partner violence:    Fear of current or ex partner: Not on file    Emotionally abused: Not on file    Physically abused: Not on file    Forced sexual activity: Not on file  Other Topics Concern  . Not on file  Social History Narrative  . Not on file     PHYSICAL EXAM   Vitals:   03/18/18 1305  BP: (!) 170/80  Pulse: 80  Weight: 131 lb 9.6 oz  (59.7 kg)  Height: 5\' 1"  (1.549 m)    Not recorded      Body mass index is 24.87 kg/m.  PHYSICAL EXAMNIATION:  Gen: NAD, conversant, well nourised, obese, well groomed                     Cardiovascular: Regular rate rhythm, no peripheral edema, warm, nontender. Eyes: Conjunctivae clear without exudates or hemorrhage Neck: Supple, no carotid bruits. Pulmonary: Clear to auscultation bilaterally   NEUROLOGICAL EXAM:  MENTAL STATUS: Speech:    Speech is normal; fluent and spontaneous with normal comprehension.  Cognition:     Orientation to time, place and person     Normal recent and remote memory     Normal Attention span and concentration     Normal Language, naming, repeating,spontaneous speech     Fund of knowledge   CRANIAL NERVES: CN II: Visual fields are full to confrontation. Fundoscopic exam is normal with sharp discs and no vascular changes. Pupils are round equal and briskly reactive to light. CN III, IV, VI: extraocular movement are normal. No ptosis. CN V: Facial sensation is intact to pinprick in all 3 divisions bilaterally. Corneal responses are intact.  CN VII: Face is symmetric with normal eye closure and smile. CN VIII: Hearing is normal to rubbing fingers CN IX, X: Palate elevates symmetrically. Phonation is normal. CN XI: Head turning and shoulder shrug are intact CN XII: Tongue is midline with normal movements and no atrophy.  MOTOR: Skin looks shiny at bilateral distal leg, mild bilateral finger abduction weakness, mild bilateral hip flexion weakness, mild to moderate bilateral ankle dorsiflexion plantar flexion weakness, left worse than right  REFLEXES: Reflexes are 2+ and symmetric at the biceps, triceps, absent at knees, and ankles. Plantar responses are flexor.  SENSORY: Length dependent decreased to light touch, vibratory sensation, pinprick to bilateral knee level.  COORDINATION: Rapid alternating movements and fine finger movements are  intact. There is no dysmetria on finger-to-nose and heel-knee-shin.    GAIT/STANCE: She needs assistance to get up from seated position, stiff, unsteady gait, also with bilateral flailed feet, could not stand up on tiptoes or heels, DIAGNOSTIC DATA (LABS, IMAGING, TESTING) - I reviewed patient records, labs, notes, testing and imaging myself where available.   ASSESSMENT AND PLAN  KARL KNARR is a 84 y.o. female   History of cervical spondylitic myelopathy, lumbar spinal canal stenosis, status post C3-7 anterior decompression and fusion, L2-5 decompression  fusion in 2012,   Progressive worsening bilateral lower extremity weakness, paresthesia, gait abnormality  Due to combination of residual deficit and worsening thoracic spinal stenosis at T9-T10, lumbar stenosis at L1 to level,  We have discussed possible surgical decompression, she will call back clinic for preference  Marcial Pacas, M.D. Ph.D.  [facility] [address] [place], [place] [address] Ph: [phone] Fax: [phone]  XG:XIVHS, Jenny Reichmann, MD

## 2018-03-19 ENCOUNTER — Encounter: Payer: Self-pay | Admitting: Neurology

## 2018-04-01 ENCOUNTER — Inpatient Hospital Stay: Payer: Medicare Other | Attending: Adult Health

## 2018-04-01 DIAGNOSIS — M129 Arthropathy, unspecified: Secondary | ICD-10-CM | POA: Diagnosis not present

## 2018-04-01 DIAGNOSIS — Z87891 Personal history of nicotine dependence: Secondary | ICD-10-CM | POA: Diagnosis not present

## 2018-04-01 DIAGNOSIS — I10 Essential (primary) hypertension: Secondary | ICD-10-CM | POA: Diagnosis not present

## 2018-04-01 DIAGNOSIS — G629 Polyneuropathy, unspecified: Secondary | ICD-10-CM | POA: Diagnosis not present

## 2018-04-01 DIAGNOSIS — K219 Gastro-esophageal reflux disease without esophagitis: Secondary | ICD-10-CM | POA: Diagnosis not present

## 2018-04-01 DIAGNOSIS — E7800 Pure hypercholesterolemia, unspecified: Secondary | ICD-10-CM | POA: Diagnosis not present

## 2018-04-01 DIAGNOSIS — D472 Monoclonal gammopathy: Secondary | ICD-10-CM

## 2018-04-01 DIAGNOSIS — E039 Hypothyroidism, unspecified: Secondary | ICD-10-CM | POA: Insufficient documentation

## 2018-04-01 DIAGNOSIS — Z79899 Other long term (current) drug therapy: Secondary | ICD-10-CM | POA: Diagnosis not present

## 2018-04-01 DIAGNOSIS — Z7982 Long term (current) use of aspirin: Secondary | ICD-10-CM | POA: Diagnosis not present

## 2018-04-01 LAB — RETICULOCYTES
RBC.: 4.41 MIL/uL (ref 3.70–5.45)
RETIC COUNT ABSOLUTE: 79.4 10*3/uL (ref 33.7–90.7)
Retic Ct Pct: 1.8 % (ref 0.7–2.1)

## 2018-04-01 LAB — CBC WITH DIFFERENTIAL (CANCER CENTER ONLY)
BASOS ABS: 0 10*3/uL (ref 0.0–0.1)
BASOS PCT: 1 %
Eosinophils Absolute: 0.1 10*3/uL (ref 0.0–0.5)
Eosinophils Relative: 2 %
HEMATOCRIT: 39 % (ref 34.8–46.6)
HEMOGLOBIN: 13.4 g/dL (ref 11.6–15.9)
LYMPHS PCT: 28 %
Lymphs Abs: 1.8 10*3/uL (ref 0.9–3.3)
MCH: 30.4 pg (ref 25.1–34.0)
MCHC: 34.4 g/dL (ref 31.5–36.0)
MCV: 88.4 fL (ref 79.5–101.0)
Monocytes Absolute: 0.6 10*3/uL (ref 0.1–0.9)
Monocytes Relative: 9 %
NEUTROS ABS: 4.1 10*3/uL (ref 1.5–6.5)
Neutrophils Relative %: 60 %
Platelet Count: 213 10*3/uL (ref 145–400)
RBC: 4.41 MIL/uL (ref 3.70–5.45)
RDW: 13.4 % (ref 11.2–14.5)
WBC: 6.6 10*3/uL (ref 3.9–10.3)

## 2018-04-01 LAB — CMP (CANCER CENTER ONLY)
ALT: 24 U/L (ref 0–44)
ANION GAP: 7 (ref 5–15)
AST: 21 U/L (ref 15–41)
Albumin: 4.1 g/dL (ref 3.5–5.0)
Alkaline Phosphatase: 66 U/L (ref 38–126)
BUN: 16 mg/dL (ref 8–23)
CHLORIDE: 106 mmol/L (ref 98–111)
CO2: 26 mmol/L (ref 22–32)
Calcium: 9.3 mg/dL (ref 8.9–10.3)
Creatinine: 0.74 mg/dL (ref 0.44–1.00)
Glucose, Bld: 96 mg/dL (ref 70–99)
POTASSIUM: 3.8 mmol/L (ref 3.5–5.1)
Sodium: 139 mmol/L (ref 135–145)
TOTAL PROTEIN: 7.3 g/dL (ref 6.5–8.1)
Total Bilirubin: 0.5 mg/dL (ref 0.3–1.2)

## 2018-04-01 LAB — LACTATE DEHYDROGENASE: LDH: 185 U/L (ref 98–192)

## 2018-04-02 LAB — KAPPA/LAMBDA LIGHT CHAINS
KAPPA FREE LGHT CHN: 13.7 mg/L (ref 3.3–19.4)
KAPPA, LAMDA LIGHT CHAIN RATIO: 0.87 (ref 0.26–1.65)
Lambda free light chains: 15.7 mg/L (ref 5.7–26.3)

## 2018-04-02 LAB — BETA 2 MICROGLOBULIN, SERUM: BETA 2 MICROGLOBULIN: 1.9 mg/L (ref 0.6–2.4)

## 2018-04-04 LAB — MULTIPLE MYELOMA PANEL, SERUM
ALPHA 1: 0.2 g/dL (ref 0.0–0.4)
ALPHA2 GLOB SERPL ELPH-MCNC: 0.7 g/dL (ref 0.4–1.0)
Albumin SerPl Elph-Mcnc: 3.5 g/dL (ref 2.9–4.4)
Albumin/Glob SerPl: 1.1 (ref 0.7–1.7)
B-GLOBULIN SERPL ELPH-MCNC: 1 g/dL (ref 0.7–1.3)
GAMMA GLOB SERPL ELPH-MCNC: 1.5 g/dL (ref 0.4–1.8)
GLOBULIN, TOTAL: 3.4 g/dL (ref 2.2–3.9)
IGG (IMMUNOGLOBIN G), SERUM: 1414 mg/dL (ref 700–1600)
IGM (IMMUNOGLOBULIN M), SRM: 59 mg/dL (ref 26–217)
IgA: 92 mg/dL (ref 64–422)
M Protein SerPl Elph-Mcnc: 1.1 g/dL — ABNORMAL HIGH
Total Protein ELP: 6.9 g/dL (ref 6.0–8.5)

## 2018-04-06 ENCOUNTER — Telehealth: Payer: Self-pay

## 2018-04-06 ENCOUNTER — Ambulatory Visit: Payer: Medicare Other | Admitting: Adult Health

## 2018-04-06 NOTE — Telephone Encounter (Signed)
Patient called to r/s appointment back on the 18th. She also inquired about a 24 hour urine sample (lab). Transferred call to RN Dory Larsen) after appointment was change. APP Suzan Garibaldi. Patient.

## 2018-04-08 ENCOUNTER — Inpatient Hospital Stay: Payer: Medicare Other | Admitting: Adult Health

## 2018-04-08 ENCOUNTER — Ambulatory Visit: Payer: Medicare Other | Admitting: Hematology and Oncology

## 2018-04-08 ENCOUNTER — Telehealth: Payer: Self-pay | Admitting: Oncology

## 2018-04-08 VITALS — BP 178/71 | HR 74 | Temp 97.9°F | Resp 18 | Ht 61.0 in | Wt 132.1 lb

## 2018-04-08 DIAGNOSIS — D472 Monoclonal gammopathy: Secondary | ICD-10-CM | POA: Diagnosis not present

## 2018-04-08 DIAGNOSIS — K219 Gastro-esophageal reflux disease without esophagitis: Secondary | ICD-10-CM

## 2018-04-08 DIAGNOSIS — G629 Polyneuropathy, unspecified: Secondary | ICD-10-CM | POA: Diagnosis not present

## 2018-04-08 DIAGNOSIS — Z87891 Personal history of nicotine dependence: Secondary | ICD-10-CM

## 2018-04-08 DIAGNOSIS — E7800 Pure hypercholesterolemia, unspecified: Secondary | ICD-10-CM

## 2018-04-08 DIAGNOSIS — E039 Hypothyroidism, unspecified: Secondary | ICD-10-CM

## 2018-04-08 DIAGNOSIS — Z79899 Other long term (current) drug therapy: Secondary | ICD-10-CM

## 2018-04-08 DIAGNOSIS — Z7982 Long term (current) use of aspirin: Secondary | ICD-10-CM

## 2018-04-08 DIAGNOSIS — I10 Essential (primary) hypertension: Secondary | ICD-10-CM

## 2018-04-08 DIAGNOSIS — M129 Arthropathy, unspecified: Secondary | ICD-10-CM | POA: Diagnosis not present

## 2018-04-08 NOTE — Progress Notes (Addendum)
[place]  [phone] [phone]     ID: JADAMARIE BUTSON DOB: 28-Dec-1933  MR#: [id number]  OMV#:[id number]  Patient Care Team: Shon Baton, MD as PCP - General (Internal Medicine) Scot Dock, NP OTHER MD:  CHIEF COMPLAINT: MGUS  CURRENT TREATMENT: observation   HISTORY OF CURRENT ILLNESS:  Berna presented to her orthopedist about a shoulder issue.  She was experiencing neuropathy and underwent a large work up with her PCP that detected a elevated M protein and she was referred to our cancer center for evaluation by Dr. Lebron Conners. A skeletal survey was done that was normal.     The patient's subsequent history is as detailed below.  INTERVAL HISTORY: Tameeka is here to transfer care from Dr. Lebron Conners regarding her MGUS.  She is doing well today.  She denies any new issues other than noting she underwent an MRI of her spine and has to f/u with Dr. Ellene Route.  She is not happy about this.    REVIEW OF SYSTEMS:  Haizlee is doing well otherwise.  She is eating and drinking without difficulty.  She does continue to have peripheral neuropathy, and this has been attributed to her spinal issues.  She does note that it did improve slightly with the oral b 12 supplementation which she has continued.  She denies any new vision issues, headaches, nausea, vomiting, dysphagia, constipation, diarrhea, shortness of breath, chest pain, palpitations or any other concerns.  A detailed ROS was otherwise non contributory.    PAST MEDICAL HISTORY: Past Medical History:  Diagnosis Date  . Arthritis    "back; shoulders" (08/31/2014)  . Cataracts, both eyes   . GERD (gastroesophageal reflux disease)    OTC, changed diet  . High cholesterol   . Hypertension   . Hypothyroidism     PAST SURGICAL HISTORY: Past Surgical History:  Procedure Laterality Date  . ANTERIOR CERVICAL DECOMP/DISCECTOMY FUSION  02/2010   Dr Ellene Route  . BACK SURGERY    . COLONOSCOPY W/  POLYPECTOMY    . DILATION AND CURETTAGE OF UTERUS    . POSTERIOR LUMBAR FUSION  04/2010   Dr Ellene Route  . TONSILLECTOMY     age 14  . TOTAL SHOULDER ARTHROPLASTY Right 08/31/2014  . TOTAL SHOULDER ARTHROPLASTY Right 08/31/2014   Procedure: TOTAL SHOULDER ARTHROPLASTY;  Surgeon: Nita Sells, MD;  Location: [place];  Service: Orthopedics;  Laterality: Right;  Right total shoulder arthroplasty    FAMILY HISTORY Non contributory  GYNECOLOGIC HISTORY:   SOCIAL HISTORY:  Vanilla is divorced and lives alone in [place], [place].  She has two children.  One is estranged.  Her daughter lives in [place].  Her daughter is her HCPOA.  She denies ETOH, tobacco, or illicit drug use.  She enjoys playing pool once per week.    ADVANCED DIRECTIVES:    HEALTH MAINTENANCE: Social History   Tobacco Use  . Smoking status: Former Smoker    Packs/day: 1.50    Years: 25.00    Pack years: 37.50    Types: Cigarettes    Last attempt to quit: 08/22/1984    Years since quitting: 33.6  . Smokeless tobacco: Never Used  Substance Use Topics  . Alcohol use: Yes    Alcohol/week: 7.2 oz    Types: 12 Glasses of wine per week  . Drug use: No     Colonoscopy:  PAP:  Bone density:   No Known Allergies  Current Outpatient Medications  Medication Sig Dispense Refill  .  aspirin EC 81 MG tablet Take 81 mg by mouth daily.    Marland Kitchen b complex vitamins capsule Take 1 capsule by mouth daily.    Marland Kitchen CALCIUM PO Take 1 tablet by mouth daily.    . Cholecalciferol (VITAMIN D PO) Take 1 tablet by mouth daily.    . clindamycin (CLEOCIN T) 1 % external solution Apply 1 application topically daily.     . cyanocobalamin 1000 MCG tablet Take 1,000 mcg by mouth daily.    Marland Kitchen levothyroxine (SYNTHROID, LEVOTHROID) 112 MCG tablet Take 112 mcg by mouth daily before breakfast.    . lisinopril (PRINIVIL,ZESTRIL) 40 MG tablet Take 40 mg by mouth daily.     . Omega-3 Fatty Acids (FISH OIL PO) Take 1 capsule by mouth daily.     No  current facility-administered medications for this visit.     OBJECTIVE:  Vitals:   04/08/18 0905  BP: (!) 178/71  Pulse: 74  Resp: 18  Temp: 97.9 F (36.6 C)  SpO2: 96%     Body mass index is 24.96 kg/m.   Wt Readings from Last 3 Encounters:  04/08/18 132 lb 1.6 oz (59.9 kg)  03/18/18 131 lb 9.6 oz (59.7 kg)  02/04/18 133 lb (60.3 kg)      ECOG FS:1 - Symptomatic but completely ambulatory GENERAL: Patient is a well appearing female in no acute distress HEENT:  Sclerae anicteric.  Oropharynx clear and moist. No ulcerations or evidence of oropharyngeal candidiasis. Neck is supple.  NODES:  No cervical, supraclavicular, or axillary lymphadenopathy palpated.  BREAST EXAM:  Deferred. LUNGS:  Clear to auscultation bilaterally.  No wheezes or rhonchi. HEART:  Regular rate and rhythm. No murmur appreciated. ABDOMEN:  Soft, nontender.  Positive, normoactive bowel sounds. No organomegaly palpated. MSK:  No focal spinal tenderness to palpation. Full range of motion bilaterally in the upper extremities. EXTREMITIES:  No peripheral edema.   SKIN:  Clear with no obvious rashes or skin changes. No nail dyscrasia. NEURO:  Nonfocal. Well oriented.  Appropriate affect.     LAB RESULTS:  CMP     Component Value Date/Time   NA 139 04/01/2018 1100   K 3.8 04/01/2018 1100   CL 106 04/01/2018 1100   CO2 26 04/01/2018 1100   GLUCOSE 96 04/01/2018 1100   BUN 16 04/01/2018 1100   CREATININE 0.74 04/01/2018 1100   CALCIUM 9.3 04/01/2018 1100   PROT 7.3 04/01/2018 1100   ALBUMIN 4.1 04/01/2018 1100   AST 21 04/01/2018 1100   ALT 24 04/01/2018 1100   ALKPHOS 66 04/01/2018 1100   BILITOT 0.5 04/01/2018 1100   GFRNONAA >60 04/01/2018 1100   GFRAA >60 04/01/2018 1100    Lab Results  Component Value Date   TOTALPROTELP 6.9 04/01/2018    Lab Results  Component Value Date   KPAFRELGTCHN 13.7 04/01/2018   LAMBDASER 15.7 04/01/2018   KAPLAMBRATIO 0.87 04/01/2018    Lab Results    Component Value Date   WBC 6.6 04/01/2018   NEUTROABS 4.1 04/01/2018   HGB 13.4 04/01/2018   HCT 39.0 04/01/2018   MCV 88.4 04/01/2018   PLT 213 04/01/2018      Chemistry      Component Value Date/Time   NA 139 04/01/2018 1100   K 3.8 04/01/2018 1100   CL 106 04/01/2018 1100   CO2 26 04/01/2018 1100   BUN 16 04/01/2018 1100   CREATININE 0.74 04/01/2018 1100      Component Value Date/Time   CALCIUM 9.3  04/01/2018 1100   ALKPHOS 66 04/01/2018 1100   AST 21 04/01/2018 1100   ALT 24 04/01/2018 1100   BILITOT 0.5 04/01/2018 1100       No results found for: LABCA2  No components found for: WCBJSE831  No results for input(s): INR in the last 168 hours.  No results found for: LABCA2  No results found for: DVV616  No results found for: WVP710  No results found for: GYI948  No results found for: CA2729  No components found for: HGQUANT  No results found for: CEA1 / No results found for: CEA1   No results found for: AFPTUMOR  No results found for: CHROMOGRNA  No results found for: PSA1  No visits with results within 3 Day(s) from this visit.  Latest known visit with results is:  Appointment on 04/01/2018  Component Date Value Ref Range Status  . Beta-2 Microglobulin 04/01/2018 1.9  0.6 - 2.4 mg/L Final   Comment: (NOTE) Siemens Immulite 2000 Immunochemiluminometric assay (ICMA) Values obtained with different assay methods or kits cannot be used interchangeably. Results cannot be interpreted as absolute evidence of the presence or absence of malignant disease. Performed At: [facility] [place], [place] [id number] Rush Farmer MD KX:[id number]   . Sodium 04/01/2018 139  135 - 145 mmol/L Final   Please note reference intervals were recently updated.  . Potassium 04/01/2018 3.8  3.5 - 5.1 mmol/L Final  . Chloride 04/01/2018 106  98 - 111 mmol/L Final  . CO2 04/01/2018 26  22 - 32 mmol/L Final  . Glucose, Bld 04/01/2018 96  70 - 99  mg/dL Final  . BUN 04/01/2018 16  8 - 23 mg/dL Final   Please note change in reference range.  . Creatinine 04/01/2018 0.74  0.44 - 1.00 mg/dL Final  . Calcium 04/01/2018 9.3  8.9 - 10.3 mg/dL Final  . Total Protein 04/01/2018 7.3  6.5 - 8.1 g/dL Final  . Albumin 04/01/2018 4.1  3.5 - 5.0 g/dL Final  . AST 04/01/2018 21  15 - 41 U/L Final  . ALT 04/01/2018 24  0 - 44 U/L Final  . Alkaline Phosphatase 04/01/2018 66  38 - 126 U/L Final  . Total Bilirubin 04/01/2018 0.5  0.3 - 1.2 mg/dL Final  . GFR, Est Non Af Am 04/01/2018 >60  >60 mL/min Final  . GFR, Est AFR Am 04/01/2018 >60  >60 mL/min Final   Comment: (NOTE) The eGFR has been calculated using the CKD EPI equation. This calculation has not been validated in all clinical situations. eGFR's persistently <60 mL/min signify possible Chronic Kidney Disease.   Georgiann Hahn gap 04/01/2018 7  5 - 15 Final   Performed at [facility], [place] [address] [place], [place] [address]  . Kappa free light chain 04/01/2018 13.7  3.3 - 19.4 mg/L Final  . Lamda free light chains 04/01/2018 15.7  5.7 - 26.3 mg/L Final  . Kappa, lamda light chain ratio 04/01/2018 0.87  0.26 - 1.65 Final   Comment: (NOTE) Performed At: [facility] [place], [place] [id number] Rush Farmer MD [id number]   . LDH 04/01/2018 185  98 - 192 U/L Final   Performed at [facility], [place] [address], [place], [place] [address]  . WBC Count 04/01/2018 6.6  3.9 - 10.3 K/uL Final  . RBC 04/01/2018 4.41  3.70 - 5.45 MIL/uL Final  . Hemoglobin 04/01/2018 13.4  11.6 - 15.9 g/dL Final  .  HCT 04/01/2018 39.0  34.8 - 46.6 % Final  . MCV 04/01/2018 88.4  79.5 - 101.0 fL Final  . MCH 04/01/2018 30.4  25.1 - 34.0 pg Final  . MCHC 04/01/2018 34.4  31.5 - 36.0 g/dL Final  . RDW 04/01/2018 13.4  11.2 - 14.5 % Final  . Platelet Count 04/01/2018 213  145 - 400 K/uL Final  . Neutrophils Relative % 04/01/2018 60  % Final    . Neutro Abs 04/01/2018 4.1  1.5 - 6.5 K/uL Final  . Lymphocytes Relative 04/01/2018 28  % Final  . Lymphs Abs 04/01/2018 1.8  0.9 - 3.3 K/uL Final  . Monocytes Relative 04/01/2018 9  % Final  . Monocytes Absolute 04/01/2018 0.6  0.1 - 0.9 K/uL Final  . Eosinophils Relative 04/01/2018 2  % Final  . Eosinophils Absolute 04/01/2018 0.1  0.0 - 0.5 K/uL Final  . Basophils Relative 04/01/2018 1  % Final  . Basophils Absolute 04/01/2018 0.0  0.0 - 0.1 K/uL Final   Performed at [facility], [place] [address] [place], [place] [address]  . IgG (Immunoglobin G), Serum 04/01/2018 1,414  700 - 1,600 mg/dL Final  . IgA 04/01/2018 92  64 - 422 mg/dL Final  . IgM (Immunoglobulin M), Srm 04/01/2018 59  26 - 217 mg/dL Final  . Total Protein ELP 04/01/2018 6.9  6.0 - 8.5 g/dL Corrected  . Albumin SerPl Elph-Mcnc 04/01/2018 3.5  2.9 - 4.4 g/dL Corrected  . Alpha 1 04/01/2018 0.2  0.0 - 0.4 g/dL Corrected  . Alpha2 Glob SerPl Elph-Mcnc 04/01/2018 0.7  0.4 - 1.0 g/dL Corrected  . B-Globulin SerPl Elph-Mcnc 04/01/2018 1.0  0.7 - 1.3 g/dL Corrected  . Gamma Glob SerPl Elph-Mcnc 04/01/2018 1.5  0.4 - 1.8 g/dL Corrected  . M Protein SerPl Elph-Mcnc 04/01/2018 1.1* Not Observed g/dL Corrected  . Globulin, Total 04/01/2018 3.4  2.2 - 3.9 g/dL Corrected  . Albumin/Glob SerPl 04/01/2018 1.1  0.7 - 1.7 Corrected  . IFE 1 04/01/2018 Comment   Corrected   Comment: (NOTE) Immunofixation shows IgG monoclonal protein with lambda light chain specificity.   . Please Note 04/01/2018 Comment   Corrected   Comment: (NOTE) Protein electrophoresis scan will follow via computer, mail, or courier delivery. Performed At: [facility] [place], [place] [id number] Rush Farmer MD UD:[id number]   . Retic Ct Pct 04/01/2018 1.8  0.7 - 2.1 % Final  . RBC. 04/01/2018 4.41  3.70 - 5.45 MIL/uL Final  . Retic Count, Absolute 04/01/2018 79.4  33.7 - 90.7 K/uL Final   Performed at [facility], [place] [address] [place], [place] [address]    (this displays the last labs from the last 3 days)  Lab Results  Component Value Date   TOTALPROTELP 6.9 04/01/2018   (this displays SPEP labs)  Lab Results  Component Value Date   KPAFRELGTCHN 13.7 04/01/2018   LAMBDASER 15.7 04/01/2018   KAPLAMBRATIO 0.87 04/01/2018   (kappa/lambda light chains)  No results found for: HGBA, HGBA2QUANT, HGBFQUANT, HGBSQUAN (Hemoglobinopathy evaluation)   Lab Results  Component Value Date   LDH 185 04/01/2018    No results found for: IRON, TIBC, IRONPCTSAT (Iron and TIBC)  No results found for: FERRITIN  Urinalysis    Component Value Date/Time   COLORURINE YELLOW 08/22/2014 0908   APPEARANCEUR CLEAR 08/22/2014 0908   LABSPEC 1.021 08/22/2014 0908   PHURINE 6.0 08/22/2014 0908   GLUCOSEU NEGATIVE 08/22/2014 0908  HGBUR NEGATIVE 08/22/2014 0908   BILIRUBINUR NEGATIVE 08/22/2014 0908   KETONESUR NEGATIVE 08/22/2014 0908   PROTEINUR NEGATIVE 08/22/2014 0908   UROBILINOGEN 0.2 08/22/2014 0908   NITRITE NEGATIVE 08/22/2014 0908   LEUKOCYTESUR SMALL (A) 08/22/2014 0908     STUDIES:     ASSESSMENT: 84 y.o. [place], [place] woman with Monoclonal gammopathy of unknown significance initially diagnosed as part of evaluation for neuropathy February 2019    (1) Labs, 11/20/17: SPEP -- M-Spike 1.0g/dL, SIFE -- IgG lambda; IgG 1684, IgA 117, IgM 69; UPEP & UIFE -- no abnormal protein findings; Vit B12 327, Folate RBC 998.     (2) Skeletal Survey, 12/01/17: No lytic skeletal lesions.    PLAN:  Doylene is doing well today.  She met with Dr. Jana Hakim to transfer her care.  She is doing well.  She underwent lab testing last week which indicate a continued M spike of 1.1. This remains stable.  Dr. Jana Hakim reviewed in detail what "MGUS" is and that we will continue to observe it with labs every 6 months, and she will have a f/u with him every year.  She verbalized  understanding of this and we will see her in October for labs, and in April for labs and f/u with Dr. Jana Hakim.     She will call with any problems that may develop before her next visit here.  A total of (30) minutes of face-to-face time was spent with this patient with greater than 50% of that time in counseling and care-coordination.   Wilber Bihari, NP   04/08/2018 10:23 AM Medical Oncology and Hematology [facility] [address] [place], [place] [address] Tel. [phone]    Fax. [phone]   ADDENDUM: I reviewed the pathophysiology of her problem with Tomi Bamberger.  She understands that she has an abnormal protein in her blood.  She understands this protein is reflective of a clone of B cells, and that clone of B cells may stay as is for any number of years, or may progress to a type of cancer called myeloma.  We reviewed her other labs which are not suggestive of myeloma and we reviewed her bone survey which shows no myeloma lesions.  Ensured she has a monoclonal gammopathy of uncertain significance.  This requires only follow-up.  We are going to check her labs every 6 months indefinitely and she will see me again a year from now.  I reassured her that this is not a malignancy and that even if she did eventually became a malignancy years from now we have many treatments available and this is not likely to take her life at any point  I personally saw this patient and performed a substantive portion of this encounter with the listed APP documented above.   Chauncey Cruel, MD Medical Oncology and Hematology [facility] [address] [place], [place] [address] Tel. [phone]    Fax. [phone]

## 2018-04-08 NOTE — Telephone Encounter (Signed)
Gave patient avs and calendar.   °

## 2018-04-13 LAB — UPEP/UIFE/LIGHT CHAINS/TP, 24-HR UR
% BETA, Urine: 0 %
ALBUMIN, U: 100 %
ALPHA 1 URINE: 0 %
Alpha 2, Urine: 0 %
Free Kappa Lt Chains,Ur: 13.2 mg/L (ref 1.35–24.19)
Free Kappa/Lambda Ratio: 9.17 (ref 2.04–10.37)
Free Lambda Lt Chains,Ur: 1.44 mg/L (ref 0.24–6.66)
GAMMA GLOBULIN URINE: 0 %
TOTAL PROTEIN, URINE-UPE24: 5.3 mg/dL
TOTAL VOLUME: 850
Total Protein, Urine-Ur/day: 45 mg/24 hr (ref 30–150)

## 2018-05-28 ENCOUNTER — Encounter: Payer: Self-pay | Admitting: Cardiovascular Disease

## 2018-06-10 DIAGNOSIS — M47816 Spondylosis without myelopathy or radiculopathy, lumbar region: Secondary | ICD-10-CM | POA: Insufficient documentation

## 2018-07-13 ENCOUNTER — Encounter: Payer: Self-pay | Admitting: Cardiovascular Disease

## 2018-07-13 ENCOUNTER — Ambulatory Visit: Payer: Medicare Other | Admitting: Cardiovascular Disease

## 2018-07-13 ENCOUNTER — Inpatient Hospital Stay: Payer: Medicare Other | Attending: Adult Health

## 2018-07-13 VITALS — BP 150/58 | HR 70 | Ht 61.0 in | Wt 134.0 lb

## 2018-07-13 DIAGNOSIS — D472 Monoclonal gammopathy: Secondary | ICD-10-CM | POA: Insufficient documentation

## 2018-07-13 DIAGNOSIS — K219 Gastro-esophageal reflux disease without esophagitis: Secondary | ICD-10-CM | POA: Diagnosis not present

## 2018-07-13 DIAGNOSIS — R0789 Other chest pain: Secondary | ICD-10-CM

## 2018-07-13 DIAGNOSIS — I10 Essential (primary) hypertension: Secondary | ICD-10-CM | POA: Diagnosis not present

## 2018-07-13 HISTORY — DX: Essential (primary) hypertension: I10

## 2018-07-13 LAB — CBC WITH DIFFERENTIAL (CANCER CENTER ONLY)
Abs Immature Granulocytes: 0.01 10*3/uL (ref 0.00–0.07)
Basophils Absolute: 0 10*3/uL (ref 0.0–0.1)
Basophils Relative: 1 %
EOS PCT: 3 %
Eosinophils Absolute: 0.2 10*3/uL (ref 0.0–0.5)
HEMATOCRIT: 39.1 % (ref 36.0–46.0)
HEMOGLOBIN: 13.2 g/dL (ref 12.0–15.0)
Immature Granulocytes: 0 %
LYMPHS ABS: 1.7 10*3/uL (ref 0.7–4.0)
LYMPHS PCT: 30 %
MCH: 30.1 pg (ref 26.0–34.0)
MCHC: 33.8 g/dL (ref 30.0–36.0)
MCV: 89.1 fL (ref 80.0–100.0)
MONO ABS: 0.6 10*3/uL (ref 0.1–1.0)
Monocytes Relative: 10 %
NRBC: 0 % (ref 0.0–0.2)
Neutro Abs: 3.3 10*3/uL (ref 1.7–7.7)
Neutrophils Relative %: 56 %
Platelet Count: 211 10*3/uL (ref 150–400)
RBC: 4.39 MIL/uL (ref 3.87–5.11)
RDW: 12.4 % (ref 11.5–15.5)
WBC: 5.9 10*3/uL (ref 4.0–10.5)

## 2018-07-13 LAB — CMP (CANCER CENTER ONLY)
ALK PHOS: 63 U/L (ref 38–126)
ALT: 23 U/L (ref 0–44)
ANION GAP: 8 (ref 5–15)
AST: 19 U/L (ref 15–41)
Albumin: 4 g/dL (ref 3.5–5.0)
BUN: 13 mg/dL (ref 8–23)
CALCIUM: 9.8 mg/dL (ref 8.9–10.3)
CO2: 25 mmol/L (ref 22–32)
Chloride: 106 mmol/L (ref 98–111)
Creatinine: 0.72 mg/dL (ref 0.44–1.00)
GFR, Estimated: >60 mL/min (ref >60)
Glucose, Bld: 104 mg/dL — ABNORMAL HIGH (ref 70–99)
POTASSIUM: 4.1 mmol/L (ref 3.5–5.1)
Sodium: 139 mmol/L (ref 135–145)
TOTAL PROTEIN: 7.6 g/dL (ref 6.5–8.1)
Total Bilirubin: 0.7 mg/dL (ref 0.3–1.2)

## 2018-07-13 LAB — LACTATE DEHYDROGENASE: LDH: 224 U/L — AB (ref 98–192)

## 2018-07-13 MED ORDER — AMLODIPINE BESYLATE 2.5 MG PO TABS: ORAL_TABLET | ORAL | 3 refills | Status: DC

## 2018-07-13 NOTE — Progress Notes (Signed)
Cardiology Office Note   Date:  07/13/2018   ID:  Carol Woods, DOB Sep 06, 1934, MRN [id number]  PCP:  Shon Baton, MD  Cardiologist:   Skeet Latch, MD   Chief Complaint  Patient presents with  . Follow-up      History of Present Illness: Carol Woods is a 84 y.o. female with hypertension, white coat hypertension, hyperlipidemia, RBBB and atypical chest pain who is being seen today for the evaluation of chest pain and hypertension at the request of Shon Baton, MD.  She saw Caprice Beaver, NP on 05/28/2018 and reported labile hypertension.  She also reported chest tightness.  Lisinopril was increased from 20mg  to 40 mg 04/2018.  Her blood pressures have ranged from the 130s to the 180s over 60s to 80s.  She did note that she has been feeling very stressed and anxious.  At that appointment lisinopril was decreased back to 20 mg and amlodipine was increased from 2.5 mg to 5 mg.  She was referred to cardiology for further evaluation.  She notes that for the last 2 or 3 months she has been under a lot of stress.  She also has chronic back pain.  She has had multiple surgeries and was offered another surgery.  However she wants to take the next 6 months to see if her pain can be controlled without surgery.  In the past she like to walk for exercise.  However she is been unable to do so lately because of the pain.  She has been estranged from her adult son for several years.  She thinks that he has bipolar disorder, as her ex-husband had this disease as well.  Her blood pressure at home has been as high as the 180s at times.  Lately it is been in the 130s to 150s.  She has reported some episodes of chest pressure.  This only occurs when lying in bed after she is eating a heavy meal.  She notes that she sometimes eats late and then lays down.  It makes her feel as though there is a tight band around her chest and that her stomach is burning.  She has tried taking Tums in the past which seems to  help.  She never has exertional chest pain or pressure.  She has occasional lower extremity edema to the ankles that improves with elevation of her legs.  She has no orthopnea or PND.  She denies palpitations, lightheadedness, or dizziness.  She notes that she has been eating a lot of potato chips lately.    Past Medical History:  Diagnosis Date  . Arthritis    "back; shoulders" (08/31/2014)  . Cataracts, both eyes   . Essential hypertension 07/13/2018  . GERD (gastroesophageal reflux disease)    OTC, changed diet  . High cholesterol   . Hypertension   . Hypothyroidism     Past Surgical History:  Procedure Laterality Date  . ANTERIOR CERVICAL DECOMP/DISCECTOMY FUSION  02/2010   Dr Ellene Route  . BACK SURGERY    . COLONOSCOPY W/ POLYPECTOMY    . DILATION AND CURETTAGE OF UTERUS    . POSTERIOR LUMBAR FUSION  04/2010   Dr Ellene Route  . TONSILLECTOMY     age 14  . TOTAL SHOULDER ARTHROPLASTY Right 08/31/2014  . TOTAL SHOULDER ARTHROPLASTY Right 08/31/2014   Procedure: TOTAL SHOULDER ARTHROPLASTY;  Surgeon: Nita Sells, MD;  Location: [place];  Service: Orthopedics;  Laterality: Right;  Right total shoulder arthroplasty  Current Outpatient Medications  Medication Sig Dispense Refill  . amLODipine (NORVASC) 2.5 MG tablet TAKE 3 TABLETS BY MOUTH IN THE EVENING 270 tablet 3  . aspirin EC 81 MG tablet Take 81 mg by mouth daily.    Marland Kitchen b complex vitamins capsule Take 1 capsule by mouth daily.    Marland Kitchen CALCIUM PO Take 1 tablet by mouth daily.    . Cholecalciferol (VITAMIN D PO) Take 1 tablet by mouth daily.    . clindamycin (CLEOCIN T) 1 % external solution Apply 1 application topically daily.     . cyanocobalamin 1000 MCG tablet Take 1,000 mcg by mouth daily.    Marland Kitchen levothyroxine (SYNTHROID, LEVOTHROID) 112 MCG tablet Take 112 mcg by mouth daily before breakfast.    . lisinopril (PRINIVIL,ZESTRIL) 20 MG tablet     . Omega-3 Fatty Acids (FISH OIL PO) Take 1 capsule by mouth daily.     No  current facility-administered medications for this visit.     Allergies:   Patient has no known allergies.    Social History:  The patient  reports that she quit smoking about 33 years ago. Her smoking use included cigarettes. She has a 37.50 pack-year smoking history. She has never used smokeless tobacco. She reports that she drinks about 12.0 standard drinks of alcohol per week. She reports that she does not use drugs.   Family History:  The patient's family history includes Alzheimer's disease in her mother; Breast cancer in her mother and sister; Heart attack in her cousin; Heart disease in her father; Hypertension in her sister; Hypothyroidism in her father; Kidney disease in her sister; Ovarian cancer in her mother; Parkinson's disease in her father; Prostate cancer in her brother.    ROS:  Please see the history of present illness.   Otherwise, review of systems are positive for none.   All other systems are reviewed and negative.    PHYSICAL EXAM: VS:  BP (!) 150/58   Pulse 70   Ht 5\' 1"  (1.549 m)   Wt 134 lb (60.8 kg)   BMI 25.32 kg/m  , BMI Body mass index is 25.32 kg/m. GENERAL:  Well appearing HEENT:  Pupils equal round and reactive, fundi not visualized, oral mucosa unremarkable NECK:  No jugular venous distention, waveform within normal limits, carotid upstroke brisk and symmetric, no bruits, no thyromegaly LUNGS:  Clear to auscultation bilaterally HEART:  RRR.  PMI not displaced or sustained,S1 and S2 within normal limits, no S3, no S4, no clicks, no rubs, I/VI systolic murmur at the RUSB ABD:  Flat, positive bowel sounds normal in frequency in pitch, no bruits, no rebound, no guarding, no midline pulsatile mass, no hepatomegaly, no splenomegaly EXT:  2 plus pulses throughout, no edema, no cyanosis no clubbing SKIN:  No rashes no nodules NEURO:  Cranial nerves II through XII grossly intact, motor grossly intact throughout PSYCH:  Cognitively intact, oriented to person  place and time   EKG:  EKG is not ordered today. The ekg ordered 05/28/2018 demonstrates sinus rhythm.  Rate 70 bpm.  Right bundle branch block.  LAFB.   Recent Labs: 07/13/2018: ALT 23; BUN 13; Creatinine 0.72; Hemoglobin 13.2; Platelet Count 211; Potassium 4.1; Sodium 139   05/28/2018: Sodium 140, potassium 3.8, BUN 15, creatinine 0.7 WBC 5.96, hemoglobin 13.3, hematocrit 41.4, platelets 234  03/16/2018: Total cholesterol 185, triglycerides 137, HDL 43, LDL 115 Hemoglobin A1c 5.8%  Lipid Panel No results found for: CHOL, TRIG, HDL, CHOLHDL, VLDL, LDLCALC, LDLDIRECT  Wt Readings from Last 3 Encounters:  07/13/18 134 lb (60.8 kg)  04/08/18 132 lb 1.6 oz (59.9 kg)  03/18/18 131 lb 9.6 oz (59.7 kg)      ASSESSMENT AND PLAN:  # Hypertension: BP is above goal.  We discussed the options of increasing amlodipine versus adding an additional agent.  She would like to start with increasing amlodipine.  She is hesitant to increase all the way to 10 mg.  We will start with 7.5 mg.  Continue lisinopril.  She will continue to track her blood pressure at home.  She will also work on limiting her salt intake and start back exercising.  She thinks that she will be able to start going back to the pool.  # Chest pain: # GERD: Carol Woods' symptoms sound like classic acid reflux.  It only occurs when lying down and only after eating heavy meals.  She has no exertional symptoms.  We discussed nonpharmacologic interventions for acid reflux.  She will work on not eating before going to bed and will elevate the head of her bed.  She will also avoid trigger foods.  If this does not work she can try over-the-counter Zantac.  If she notices any exertional symptoms or her symptoms persist despite these interventions then we will consider stress testing in the future.     Current medicines are reviewed at length with the patient today.  The patient does not have concerns regarding medicines.  The following  changes have been made: Increase amlodipine to 7.5 mg daily.  Labs/ tests ordered today include:  No orders of the defined types were placed in this encounter.    Disposition:   FU with Heidy Mccubbin C. Oval Linsey, MD, [facility] in      [place], [place] Oval Linsey, MD, [facility]  07/13/2018 3:54 PM    [place]

## 2018-07-13 NOTE — Patient Instructions (Addendum)
Medication Instructions:  INCREASE YOUR AMLODIPINE TO 7.5 MG DAILY   If you need a refill on your cardiac medications before your next appointment, please call your pharmacy.   Lab work: NONE   Testing/Procedures: NONE  Follow-Up: At [facility], you and your health needs are our priority.  As part of our continuing mission to provide you with exceptional heart care, we have created designated Provider Care Teams.  These Care Teams include your primary Cardiologist (physician) and Advanced Practice Providers (APPs -  Physician Assistants and Nurse Practitioners) who all work together to provide you with the care you need, when you need it. You will need a follow up appointment in 2 months.  You may see DR [facility] or one of the following Advanced Practice Providers on your designated Care Team:   Kerin Ransom, PA-C Roby Lofts, [place] . Sande Rives, PA-C  Any Other Special Instructions Will Be Listed Below (If Applicable). WORK ON DECREASING YOUR SALT INTAKE  WORK ON INCREASING YOUR EXERCISE

## 2018-07-14 LAB — KAPPA/LAMBDA LIGHT CHAINS
Kappa free light chain: 14.9 mg/L (ref 3.3–19.4)
Kappa, lambda light chain ratio: 0.9 (ref 0.26–1.65)
Lambda free light chains: 16.6 mg/L (ref 5.7–26.3)

## 2018-07-14 LAB — BETA 2 MICROGLOBULIN, SERUM: BETA 2 MICROGLOBULIN: 1.8 mg/L (ref 0.6–2.4)

## 2018-07-15 LAB — MULTIPLE MYELOMA PANEL, SERUM
ALBUMIN/GLOB SERPL: 1.3 (ref 0.7–1.7)
ALPHA 1: 0.2 g/dL (ref 0.0–0.4)
Albumin SerPl Elph-Mcnc: 4 g/dL (ref 2.9–4.4)
Alpha2 Glob SerPl Elph-Mcnc: 0.7 g/dL (ref 0.4–1.0)
B-Globulin SerPl Elph-Mcnc: 1 g/dL (ref 0.7–1.3)
Gamma Glob SerPl Elph-Mcnc: 1.4 g/dL (ref 0.4–1.8)
Globulin, Total: 3.2 g/dL (ref 2.2–3.9)
IGA: 100 mg/dL (ref 64–422)
IGM (IMMUNOGLOBULIN M), SRM: 72 mg/dL (ref 26–217)
IgG (Immunoglobin G), Serum: 1440 mg/dL (ref 700–1600)
M Protein SerPl Elph-Mcnc: 1 g/dL — ABNORMAL HIGH
Total Protein ELP: 7.2 g/dL (ref 6.0–8.5)

## 2018-07-19 ENCOUNTER — Other Ambulatory Visit: Payer: Self-pay | Admitting: Oncology

## 2018-07-19 ENCOUNTER — Encounter: Payer: Self-pay | Admitting: Oncology

## 2018-09-29 ENCOUNTER — Ambulatory Visit: Payer: Medicare Other | Admitting: Cardiovascular Disease

## 2018-10-12 ENCOUNTER — Encounter: Payer: Self-pay | Admitting: Cardiology

## 2018-10-12 ENCOUNTER — Ambulatory Visit: Payer: Medicare Other | Admitting: Medical

## 2018-10-12 VITALS — BP 162/68 | HR 82 | Ht 61.0 in | Wt 131.6 lb

## 2018-10-12 DIAGNOSIS — K219 Gastro-esophageal reflux disease without esophagitis: Secondary | ICD-10-CM

## 2018-10-12 DIAGNOSIS — R0789 Other chest pain: Secondary | ICD-10-CM

## 2018-10-12 DIAGNOSIS — I10 Essential (primary) hypertension: Secondary | ICD-10-CM | POA: Diagnosis not present

## 2018-10-12 NOTE — Patient Instructions (Addendum)
Medication Instructions:  You can take over the counter Pepcid for acid reflux as needed no other medication changes  If you need a refill on your cardiac medications before your next appointment, please call your pharmacy.   Lab work: None  If you have labs (blood work) drawn today and your tests are completely normal, you will receive your results only by: Marland Kitchen MyChart Message (if you have MyChart) OR . A paper copy in the mail If you have any lab test that is abnormal or we need to change your treatment, we will call you to review the results.  Testing/Procedures: None   Follow-Up: At [facility], you and your health needs are our priority.  As part of our continuing mission to provide you with exceptional heart care, we have created designated Provider Care Teams.  These Care Teams include your primary Cardiologist (physician) and Advanced Practice Providers (APPs -  Physician Assistants and Nurse Practitioners) who all work together to provide you with the care you need, when you need it. You will need a follow up appointment in 12 months.  Please call our office 2 months (November 2020) in advance to schedule this appointment.  You may see Skeet Latch, MD or one of the following Advanced Practice Providers on your designated Care Team:   Kerin Ransom, PA-C Roby Lofts, [place] . Sande Rives, PA-C  Any Other Special Instructions Will Be Listed Below (If Applicable).    Low-Sodium Eating Plan Sodium, which is an element that makes up salt, helps you maintain a healthy balance of fluids in your body. Too much sodium can increase your blood pressure and cause fluid and waste to be held in your body. Your health care provider or dietitian may recommend following this plan if you have high blood pressure (hypertension), kidney disease, liver disease, or heart failure. Eating less sodium can help lower your blood pressure, reduce swelling, and protect your heart, liver, and  kidneys. What are tips for following this plan? General guidelines  Most people on this plan should limit their sodium intake to 1,500-2,000 mg (milligrams) of sodium each day. Reading food labels   The Nutrition Facts label lists the amount of sodium in one serving of the food. If you eat more than one serving, you must multiply the listed amount of sodium by the number of servings.  Choose foods with less than 140 mg of sodium per serving.  Avoid foods with 300 mg of sodium or more per serving. Shopping  Look for lower-sodium products, often labeled as "low-sodium" or "no salt added."  Always check the sodium content even if foods are labeled as "unsalted" or "no salt added".  Buy fresh foods. ? Avoid canned foods and premade or frozen meals. ? Avoid canned, cured, or processed meats  Buy breads that have less than 80 mg of sodium per slice. Cooking  Eat more home-cooked food and less restaurant, buffet, and fast food.  Avoid adding salt when cooking. Use salt-free seasonings or herbs instead of table salt or sea salt. Check with your health care provider or pharmacist before using salt substitutes.  Cook with plant-based oils, such as canola, sunflower, or olive oil. Meal planning  When eating at a restaurant, ask that your food be prepared with less salt or no salt, if possible.  Avoid foods that contain MSG (monosodium glutamate). MSG is sometimes added to [place] food, bouillon, and some canned foods. What foods are recommended? The items listed may not be a complete  list. Talk with your dietitian about what dietary choices are best for you. Grains Low-sodium cereals, including oats, puffed wheat and rice, and shredded wheat. Low-sodium crackers. Unsalted rice. Unsalted pasta. Low-sodium bread. Whole-grain breads and whole-grain pasta. Vegetables Fresh or frozen vegetables. "No salt added" canned vegetables. "No salt added" tomato sauce and paste. Low-sodium or  reduced-sodium tomato and vegetable juice. Fruits Fresh, frozen, or canned fruit. Fruit juice. Meats and other protein foods Fresh or frozen (no salt added) meat, poultry, seafood, and fish. Low-sodium canned tuna and salmon. Unsalted nuts. Dried peas, beans, and lentils without added salt. Unsalted canned beans. Eggs. Unsalted nut butters. Dairy Milk. Soy milk. Cheese that is naturally low in sodium, such as ricotta cheese, fresh mozzarella, or Swiss cheese Low-sodium or reduced-sodium cheese. Cream cheese. Yogurt. Fats and oils Unsalted butter. Unsalted margarine with no trans fat. Vegetable oils such as canola or olive oils. Seasonings and other foods Fresh and dried herbs and spices. Salt-free seasonings. Low-sodium mustard and ketchup. Sodium-free salad dressing. Sodium-free light mayonnaise. Fresh or refrigerated horseradish. Lemon juice. Vinegar. Homemade, reduced-sodium, or low-sodium soups. Unsalted popcorn and pretzels. Low-salt or salt-free chips. What foods are not recommended? The items listed may not be a complete list. Talk with your dietitian about what dietary choices are best for you. Grains Instant hot cereals. Bread stuffing, pancake, and biscuit mixes. Croutons. Seasoned rice or pasta mixes. Noodle soup cups. Boxed or frozen macaroni and cheese. Regular salted crackers. Self-rising flour. Vegetables Sauerkraut, pickled vegetables, and relishes. Olives. [place] fries. Onion rings. Regular canned vegetables (not low-sodium or reduced-sodium). Regular canned tomato sauce and paste (not low-sodium or reduced-sodium). Regular tomato and vegetable juice (not low-sodium or reduced-sodium). Frozen vegetables in sauces. Meats and other protein foods Meat or fish that is salted, canned, smoked, spiced, or pickled. Bacon, ham, sausage, hotdogs, corned beef, chipped beef, packaged lunch meats, salt pork, jerky, pickled herring, anchovies, regular canned tuna, sardines, salted  nuts. Dairy Processed cheese and cheese spreads. Cheese curds. Blue cheese. Feta cheese. String cheese. Regular cottage cheese. Buttermilk. Canned milk. Fats and oils Salted butter. Regular margarine. Ghee. Bacon fat. Seasonings and other foods Onion salt, garlic salt, seasoned salt, table salt, and sea salt. Canned and packaged gravies. Worcestershire sauce. Tartar sauce. Barbecue sauce. Teriyaki sauce. Soy sauce, including reduced-sodium. Steak sauce. Fish sauce. Oyster sauce. Cocktail sauce. Horseradish that you find on the shelf. Regular ketchup and mustard. Meat flavorings and tenderizers. Bouillon cubes. Hot sauce and Tabasco sauce. Premade or packaged marinades. Premade or packaged taco seasonings. Relishes. Regular salad dressings. Salsa. Potato and tortilla chips. Corn chips and puffs. Salted popcorn and pretzels. Canned or dried soups. Pizza. Frozen entrees and pot pies. Summary  Eating less sodium can help lower your blood pressure, reduce swelling, and protect your heart, liver, and kidneys.  Most people on this plan should limit their sodium intake to 1,500-2,000 mg (milligrams) of sodium each day.  Canned, boxed, and frozen foods are high in sodium. Restaurant foods, fast foods, and pizza are also very high in sodium. You also get sodium by adding salt to food.  Try to cook at home, eat more fresh fruits and vegetables, and eat less fast food, canned, processed, or prepared foods. This information is not intended to replace advice given to you by your health care provider. Make sure you discuss any questions you have with your health care provider. Document Released: 02/28/2002 Document Revised: 09/01/2016 Document Reviewed: 09/01/2016 Elsevier Interactive Patient Education  2019 [employer].

## 2018-10-12 NOTE — Progress Notes (Signed)
Cardiology Office Note   Date:  10/12/2018   ID:  Carol Woods, DOB [birth year], MRN [id number]  PCP:  Shon Baton, MD  Cardiologist:  Skeet Latch, MD EP: None  Chief Complaint  Patient presents with  . Follow-up    chest pain and HTN      History of Present Illness: Carol Woods is a 85 y.o. female with a PMH of HTN, white coat HTN, HLD, RBBB, and atypical chest pain, who presents for follow-upof her atypical chest pain.   She was last seen by cardiology, Dr. Oval Linsey, outpatient 06/2018 at which time she continued to complain of chest pressure when laying in bed after a heavy meal. She was without exertional chest pain/pressure. She was without other cardiac complaints. Her blood pressure was above goal and her amlodipine was increased to 7.5mg  daily. It was felt that her chest pain was related to GERD and she was also given recommendations for management including non-pharm interventions and OTC zantac.   She returns alone today for follow-up. She continues to have occasional band like lower chest pain which only occurs when she lays down in bed after eating a large meal. No complaints of exertional chest pain or SOB. She continues to be limited in mobility by back pain. She is hopeful to join [employer] to increase her activity level. She has been monitoring her blood pressure at home with BP typically in the 130s-140s/60s-70s. She denies palpitations, SOB, DOE, LE edema, dizziness, lightheadedness, or syncope.     Past Medical History:  Diagnosis Date  . Arthritis    "back; shoulders" (08/31/2014)  . Cataracts, both eyes   . Essential hypertension 07/13/2018  . GERD (gastroesophageal reflux disease)    OTC, changed diet  . High cholesterol   . Hypertension   . Hypothyroidism     Past Surgical History:  Procedure Laterality Date  . ANTERIOR CERVICAL DECOMP/DISCECTOMY FUSION  02/2010   Dr Ellene Route  . BACK SURGERY    . COLONOSCOPY W/ POLYPECTOMY    .  DILATION AND CURETTAGE OF UTERUS    . POSTERIOR LUMBAR FUSION  04/2010   Dr Ellene Route  . TONSILLECTOMY     age 9  . TOTAL SHOULDER ARTHROPLASTY Right 08/31/2014  . TOTAL SHOULDER ARTHROPLASTY Right 08/31/2014   Procedure: TOTAL SHOULDER ARTHROPLASTY;  Surgeon: Nita Sells, MD;  Location: [facility];  Service: Orthopedics;  Laterality: Right;  Right total shoulder arthroplasty     Current Outpatient Medications  Medication Sig Dispense Refill  . amLODipine (NORVASC) 2.5 MG tablet TAKE 3 TABLETS BY MOUTH IN THE EVENING 270 tablet 3  . aspirin EC 81 MG tablet Take 81 mg by mouth daily.    Marland Kitchen b complex vitamins capsule Take 1 capsule by mouth daily.    Marland Kitchen CALCIUM PO Take 1 tablet by mouth daily.    . Cholecalciferol (VITAMIN D PO) Take 1 tablet by mouth daily.    . clindamycin (CLEOCIN T) 1 % external solution Apply 1 application topically daily.     . cyanocobalamin 1000 MCG tablet Take 1,000 mcg by mouth daily.    Marland Kitchen levothyroxine (SYNTHROID, LEVOTHROID) 112 MCG tablet Take 112 mcg by mouth daily before breakfast.    . lisinopril (PRINIVIL,ZESTRIL) 20 MG tablet     . Omega-3 Fatty Acids (FISH OIL PO) Take 1 capsule by mouth daily.     No current facility-administered medications for this visit.     Allergies:   Patient has no  known allergies.    Social History:  The patient  reports that she quit smoking about 34 years ago. Her smoking use included cigarettes. She has a 37.50 pack-year smoking history. She has never used smokeless tobacco. She reports current alcohol use of about 12.0 standard drinks of alcohol per week. She reports that she does not use drugs.   Family History:  The patient's family history includes Alzheimer's disease in her mother; Breast cancer in her mother and sister; Heart attack in her cousin; Heart disease in her father; Hypertension in her sister; Hypothyroidism in her father; Kidney disease in her sister; Ovarian cancer in her mother; Parkinson's disease in  her father; Prostate cancer in her brother.    ROS:  Please see the history of present illness.   Otherwise, review of systems are positive for none.   All other systems are reviewed and negative.    PHYSICAL EXAM: VS:  BP (!) 162/68   Pulse 82   Ht 5\' 1"  (1.549 m)   Wt 131 lb 9.6 oz (59.7 kg)   SpO2 95%   BMI 24.87 kg/m  , BMI Body mass index is 24.87 kg/m. GEN: Well nourished, well developed, in no acute distress HEENT: sclera anicteric Neck: no JVD, carotid bruits, or masses Cardiac: RRR; no murmurs, rubs, or gallops,no edema  Respiratory:  clear to auscultation bilaterally, normal work of breathing GI: soft, nontender, nondistended, + BS MS: no deformity or atrophy Skin: warm and dry, no rash Neuro:  Strength and sensation are intact Psych: euthymic mood, full affect   EKG:  EKG is not ordered today.   Recent Labs: 07/13/2018: ALT 23; BUN 13; Creatinine 0.72; Hemoglobin 13.2; Platelet Count 211; Potassium 4.1; Sodium 139    Lipid Panel No results found for: CHOL, TRIG, HDL, CHOLHDL, VLDL, LDLCALC, LDLDIRECT    Wt Readings from Last 3 Encounters:  10/12/18 131 lb 9.6 oz (59.7 kg)  07/13/18 134 lb (60.8 kg)  04/08/18 132 lb 1.6 oz (59.9 kg)      Other studies Reviewed: Additional studies/ records that were reviewed today include: None    ASSESSMENT AND PLAN:  1. Atypical chest pain: more consistent with GERD as symptoms only occur when laying down after eating a large meal. No exertional complaints.  - Continue to encourage limiting food intake close to bedtime and elevating head of the bed. - Recommended OTC tums and pepcid as needed for symptom management  2. HTN: BP 162/68 initially; repeat 130/60. She is hesitant to increase her amlodipine further as she does not like taking medications. She is hopeful to join [employer] to increase her activity level.  - Encouraged her to continue to monitor her blood pressure at home and if persistently with SBP  >140, notify the office as she may need to increase her amlodipine - Continue amlodipine 7.5mg  daily and lisinopril 20mg  daily     Current medicines are reviewed at length with the patient today.  The patient does not have concerns regarding medicines.  The following changes have been made:  no change  Labs/ tests ordered today include: None No orders of the defined types were placed in this encounter.    Disposition:   FU with Dr. Oval Linsey in 1 year.  Signed, Abigail Butts, PA-C  10/12/2018 9:33 AM

## 2018-11-12 ENCOUNTER — Other Ambulatory Visit: Payer: Self-pay | Admitting: Orthopedic Surgery

## 2018-11-12 DIAGNOSIS — M25512 Pain in left shoulder: Secondary | ICD-10-CM

## 2018-11-25 ENCOUNTER — Ambulatory Visit
Admission: RE | Admit: 2018-11-25 | Discharge: 2018-11-25 | Disposition: A | Discharge status: Home / Self Care | Payer: Medicare Other | Source: Ambulatory Visit | Attending: Orthopedic Surgery | Admitting: Orthopedic Surgery

## 2018-11-25 DIAGNOSIS — M25512 Pain in left shoulder: Secondary | ICD-10-CM

## 2018-12-03 ENCOUNTER — Other Ambulatory Visit: Payer: Medicare Other

## 2018-12-28 ENCOUNTER — Other Ambulatory Visit: Payer: Self-pay | Admitting: Oncology

## 2019-01-12 ENCOUNTER — Ambulatory Visit: Payer: Medicare Other | Admitting: Oncology

## 2019-01-12 ENCOUNTER — Other Ambulatory Visit: Payer: Medicare Other

## 2019-01-17 ENCOUNTER — Telehealth: Payer: Self-pay | Admitting: Oncology

## 2019-01-17 NOTE — Telephone Encounter (Signed)
Per 4/27 schedule message moved 5/5 appointments to 8/24. Spoke with patient.

## 2019-01-25 ENCOUNTER — Ambulatory Visit: Payer: Medicare Other | Admitting: Oncology

## 2019-01-25 ENCOUNTER — Other Ambulatory Visit: Payer: Medicare Other

## 2019-03-16 ENCOUNTER — Other Ambulatory Visit: Payer: Self-pay | Admitting: Neurological Surgery

## 2019-03-16 DIAGNOSIS — M5124 Other intervertebral disc displacement, thoracic region: Secondary | ICD-10-CM

## 2019-04-12 ENCOUNTER — Ambulatory Visit
Admission: RE | Admit: 2019-04-12 | Discharge: 2019-04-12 | Disposition: A | Discharge status: Home / Self Care | Payer: Medicare Other | Source: Ambulatory Visit | Attending: Neurological Surgery | Admitting: Neurological Surgery

## 2019-04-12 ENCOUNTER — Other Ambulatory Visit: Payer: Self-pay

## 2019-04-12 DIAGNOSIS — M5124 Other intervertebral disc displacement, thoracic region: Secondary | ICD-10-CM

## 2019-04-20 DIAGNOSIS — M5104 Intervertebral disc disorders with myelopathy, thoracic region: Secondary | ICD-10-CM | POA: Insufficient documentation

## 2019-05-13 ENCOUNTER — Other Ambulatory Visit: Payer: Self-pay | Admitting: *Deleted

## 2019-05-13 DIAGNOSIS — D472 Monoclonal gammopathy: Secondary | ICD-10-CM

## 2019-05-15 NOTE — Progress Notes (Signed)
[place]  [phone] [phone]     ID: Carol Woods DOB: 1934/03/22  MR#: [id number]  CZY#:[id number]  Patient Care Team: Shon Baton, MD as PCP - General (Internal Medicine) Skeet Latch, MD as PCP - Cardiology (Cardiology) Magrinat, Virgie Dad, MD as Consulting Physician (Oncology) Marcial Pacas, MD as Consulting Physician (Neurology) Aurea Graff OTHER MD:  I connected with [name] on 05/16/19 at  1:00 PM EDT by telephone visit and verified that I am speaking with the correct person using two identifiers.   I discussed the limitations, risks, security and privacy concerns of performing an evaluation and management service by telemedicine and the availability of in-person appointments. I also discussed with the patient that there may be a patient responsible charge related to this service. The patient expressed understanding and agreed to proceed.   Other persons participating in the visit and their role in the encounter: none  Patients location: home Providers location:clinic   CHIEF COMPLAINT: Monoclonal gammopathy of uncertain significance  CURRENT TREATMENT: observation   INTERVAL HISTORY: [place] was due for a visit today for follow up of her MGUS.  However we called and told her we would do a phone visit instead to avoid the risk of her coming here and getting exposed to the coronavirus for what really is a benign condition.  However when I called home (later than we had originally scheduled because of events in the office) I was not able to reach her and instead left a voicemail   REVIEW OF SYSTEMS: Unable to obtain  HISTORY OF CURRENT ILLNESS: Breunna presented to her orthopedist about a shoulder issue.  She was experiencing neuropathy and underwent a large work up with her PCP that detected a elevated M protein and she was referred to our cancer center for evaluation by Dr. Lebron Conners. A skeletal survey was done  that was normal.     The patient's subsequent history is as detailed below.   PAST MEDICAL HISTORY: Past Medical History:  Diagnosis Date   Arthritis    "back; shoulders" (08/31/2014)   Cataracts, both eyes    Essential hypertension 07/13/2018   GERD (gastroesophageal reflux disease)    OTC, changed diet   High cholesterol    Hypertension    Hypothyroidism     PAST SURGICAL HISTORY: Past Surgical History:  Procedure Laterality Date   ANTERIOR CERVICAL DECOMP/DISCECTOMY FUSION  02/2010   Dr Ellene Route   BACK SURGERY     COLONOSCOPY W/ POLYPECTOMY     DILATION AND CURETTAGE OF UTERUS     POSTERIOR LUMBAR FUSION  04/2010   Dr Ellene Route   TONSILLECTOMY     age 43   TOTAL SHOULDER ARTHROPLASTY Right 08/31/2014   TOTAL SHOULDER ARTHROPLASTY Right 08/31/2014   Procedure: TOTAL SHOULDER ARTHROPLASTY;  Surgeon: Nita Sells, MD;  Location: [place];  Service: Orthopedics;  Laterality: Right;  Right total shoulder arthroplasty    FAMILY HISTORY Family History  Problem Relation Age of Onset   Breast cancer Mother    Ovarian cancer Mother    Alzheimer's disease Mother    Parkinson's disease Father    Heart disease Father    Hypothyroidism Father    Hypertension Sister    Breast cancer Sister    Kidney disease Sister    Prostate cancer Brother    Heart attack Cousin     GYNECOLOGIC HISTORY:  No LMP recorded. Patient is postmenopausal.   SOCIAL HISTORY:  Jaianna is divorced  and lives alone in [place], [place].  She has two children.  One is estranged.  Her daughter lives in [place].  Her daughter is her HCPOA.  She denies ETOH, tobacco, or illicit drug use.  She enjoys playing pool once per week.     ADVANCED DIRECTIVES:    HEALTH MAINTENANCE: Social History   Tobacco Use   Smoking status: Former Smoker    Packs/day: 1.50    Years: 25.00    Pack years: 37.50    Types: Cigarettes    Quit date: 08/22/1984    Years since quitting: 34.7     Smokeless tobacco: Never Used  Substance Use Topics   Alcohol use: Yes    Alcohol/week: 12.0 standard drinks    Types: 12 Glasses of wine per week   Drug use: No     Colonoscopy:  PAP:  Bone density:   No Known Allergies  Current Outpatient Medications  Medication Sig Dispense Refill   amLODipine (NORVASC) 2.5 MG tablet TAKE 3 TABLETS BY MOUTH IN THE EVENING 270 tablet 3   aspirin EC 81 MG tablet Take 81 mg by mouth daily.     b complex vitamins capsule Take 1 capsule by mouth daily.     CALCIUM PO Take 1 tablet by mouth daily.     Cholecalciferol (VITAMIN D PO) Take 1 tablet by mouth daily.     clindamycin (CLEOCIN T) 1 % external solution Apply 1 application topically daily.      cyanocobalamin 1000 MCG tablet Take 1,000 mcg by mouth daily.     levothyroxine (SYNTHROID, LEVOTHROID) 112 MCG tablet Take 112 mcg by mouth daily before breakfast.     lisinopril (PRINIVIL,ZESTRIL) 20 MG tablet      Omega-3 Fatty Acids (FISH OIL PO) Take 1 capsule by mouth daily.     No current facility-administered medications for this visit.     OBJECTIVE:  There were no vitals filed for this visit.   There is no height or weight on file to calculate BMI.   Wt Readings from Last 3 Encounters:  10/12/18 131 lb 9.6 oz (59.7 kg)  07/13/18 134 lb (60.8 kg)  04/08/18 132 lb 1.6 oz (59.9 kg)      ECOG FS:1 - Symptomatic but completely ambulatory  telemed  LAB RESULTS:  CMP     Component Value Date/Time   NA 139 07/13/2018 0935   K 4.1 07/13/2018 0935   CL 106 07/13/2018 0935   CO2 25 07/13/2018 0935   GLUCOSE 104 (H) 07/13/2018 0935   BUN 13 07/13/2018 0935   CREATININE 0.72 07/13/2018 0935   CALCIUM 9.8 07/13/2018 0935   PROT 7.6 07/13/2018 0935   ALBUMIN 4.0 07/13/2018 0935   AST 19 07/13/2018 0935   ALT 23 07/13/2018 0935   ALKPHOS 63 07/13/2018 0935   BILITOT 0.7 07/13/2018 0935   GFRNONAA >60 07/13/2018 0935   GFRAA >60 07/13/2018 0935    Lab Results   Component Value Date   TOTALPROTELP 7.2 07/13/2018     Lab Results  Component Value Date   KPAFRELGTCHN 14.9 07/13/2018   LAMBDASER 16.6 07/13/2018   KAPLAMBRATIO 0.90 07/13/2018    Lab Results  Component Value Date   WBC 5.9 07/13/2018   NEUTROABS 3.3 07/13/2018   HGB 13.2 07/13/2018   HCT 39.1 07/13/2018   MCV 89.1 07/13/2018   PLT 211 07/13/2018      Chemistry      Component Value Date/Time   NA 139 07/13/2018  0935   K 4.1 07/13/2018 0935   CL 106 07/13/2018 0935   CO2 25 07/13/2018 0935   BUN 13 07/13/2018 0935   CREATININE 0.72 07/13/2018 0935      Component Value Date/Time   CALCIUM 9.8 07/13/2018 0935   ALKPHOS 63 07/13/2018 0935   AST 19 07/13/2018 0935   ALT 23 07/13/2018 0935   BILITOT 0.7 07/13/2018 0935       No results found for: LABCA2  No components found for: MVHQIO962  No results for input(s): INR in the last 168 hours.  No results found for: LABCA2  No results found for: XBM841  No results found for: LKG401  No results found for: UUV253  No results found for: CA2729  No components found for: HGQUANT  No results found for: CEA1 / No results found for: CEA1   No results found for: AFPTUMOR  No results found for: CHROMOGRNA  No results found for: PSA1  No visits with results within 3 Day(s) from this visit.  Latest known visit with results is:  Appointment on 07/13/2018  Component Date Value Ref Range Status   WBC Count 07/13/2018 5.9  4.0 - 10.5 K/uL Final   RBC 07/13/2018 4.39  3.87 - 5.11 MIL/uL Final   Hemoglobin 07/13/2018 13.2  12.0 - 15.0 g/dL Final   HCT 07/13/2018 39.1  36.0 - 46.0 % Final   MCV 07/13/2018 89.1  80.0 - 100.0 fL Final   MCH 07/13/2018 30.1  26.0 - 34.0 pg Final   MCHC 07/13/2018 33.8  30.0 - 36.0 g/dL Final   RDW 07/13/2018 12.4  11.5 - 15.5 % Final   Platelet Count 07/13/2018 211  150 - 400 K/uL Final   nRBC 07/13/2018 0.0  0.0 - 0.2 % Final   Neutrophils Relative % 07/13/2018 56  %  Final   Neutro Abs 07/13/2018 3.3  1.7 - 7.7 K/uL Final   Lymphocytes Relative 07/13/2018 30  % Final   Lymphs Abs 07/13/2018 1.7  0.7 - 4.0 K/uL Final   Monocytes Relative 07/13/2018 10  % Final   Monocytes Absolute 07/13/2018 0.6  0.1 - 1.0 K/uL Final   Eosinophils Relative 07/13/2018 3  % Final   Eosinophils Absolute 07/13/2018 0.2  0.0 - 0.5 K/uL Final   Basophils Relative 07/13/2018 1  % Final   Basophils Absolute 07/13/2018 0.0  0.0 - 0.1 K/uL Final   Immature Granulocytes 07/13/2018 0  % Final   Abs Immature Granulocytes 07/13/2018 0.01  0.00 - 0.07 K/uL Final   Performed at [facility], [place] [address], [place], [place] [address]   Sodium 07/13/2018 139  135 - 145 mmol/L Final   Potassium 07/13/2018 4.1  3.5 - 5.1 mmol/L Final   Chloride 07/13/2018 106  98 - 111 mmol/L Final   CO2 07/13/2018 25  22 - 32 mmol/L Final   Glucose, Bld 07/13/2018 104* 70 - 99 mg/dL Final   BUN 07/13/2018 13  8 - 23 mg/dL Final   Creatinine 07/13/2018 0.72  0.44 - 1.00 mg/dL Final   Calcium 07/13/2018 9.8  8.9 - 10.3 mg/dL Final   Total Protein 07/13/2018 7.6  6.5 - 8.1 g/dL Final   Albumin 07/13/2018 4.0  3.5 - 5.0 g/dL Final   AST 07/13/2018 19  15 - 41 U/L Final   ALT 07/13/2018 23  0 - 44 U/L Final   Alkaline Phosphatase 07/13/2018 63  38 - 126 U/L Final   Total Bilirubin 07/13/2018  0.7  0.3 - 1.2 mg/dL Final   GFR, Est Non Af Am 07/13/2018 >60  >60 mL/min Final   GFR, Est AFR Am 07/13/2018 >60  >60 mL/min Final   Comment: (NOTE) The eGFR has been calculated using the CKD EPI equation. This calculation has not been validated in all clinical situations. eGFR's persistently <60 mL/min signify possible Chronic Kidney Disease.    Anion gap 07/13/2018 8  5 - 15 Final   Performed at [facility], [place] [address] [place], [place] [address]   LDH 07/13/2018 224* 98 - 192 U/L Final   Performed at [facility], [place] [address] [place], [place] [address]   Kappa free light chain 07/13/2018 14.9  3.3 - 19.4 mg/L Final   Lamda free light chains 07/13/2018 16.6  5.7 - 26.3 mg/L Final   Kappa, lamda light chain ratio 07/13/2018 0.90  0.26 - 1.65 Final   Comment: (NOTE) Performed At: [facility] [place], [place] [id number] Rush Farmer MD [id number]    IgG (Immunoglobin G), Serum 07/13/2018 1,440  700 - 1,600 mg/dL Final   IgA 07/13/2018 100  64 - 422 mg/dL Final   IgM (Immunoglobulin M), Srm 07/13/2018 72  26 - 217 mg/dL Final   Total Protein ELP 07/13/2018 7.2  6.0 - 8.5 g/dL Corrected   Albumin SerPl Elph-Mcnc 07/13/2018 4.0  2.9 - 4.4 g/dL Corrected   Alpha 1 07/13/2018 0.2  0.0 - 0.4 g/dL Corrected   Alpha2 Glob SerPl Elph-Mcnc 07/13/2018 0.7  0.4 - 1.0 g/dL Corrected   B-Globulin SerPl Elph-Mcnc 07/13/2018 1.0  0.7 - 1.3 g/dL Corrected   Gamma Glob SerPl Elph-Mcnc 07/13/2018 1.4  0.4 - 1.8 g/dL Corrected   M Protein SerPl Elph-Mcnc 07/13/2018 1.0* Not Observed g/dL Corrected   Globulin, Total 07/13/2018 3.2  2.2 - 3.9 g/dL Corrected   Albumin/Glob SerPl 07/13/2018 1.3  0.7 - 1.7 Corrected   IFE 1 07/13/2018 Comment   Corrected   Comment: (NOTE) Immunofixation shows IgG monoclonal protein with lambda light chain specificity.    Please Note 07/13/2018 Comment   Corrected   Comment: (NOTE) Protein electrophoresis scan will follow via computer, mail, or courier delivery. Performed At: [facility] [place], [place] [id number] Rush Farmer MD [id number]    Beta-2 Microglobulin 07/13/2018 1.8  0.6 - 2.4 mg/L Final   Comment: (NOTE) Siemens Immulite 2000 Immunochemiluminometric assay (ICMA) Values obtained with different assay methods or kits cannot be used interchangeably. Results cannot be interpreted as absolute evidence of the presence or absence of malignant disease. Performed At: [facility] [place], [place] [id number] Rush Farmer MD [id number]     (this displays the last labs from the last 3 days)  Lab Results  Component Value Date   TOTALPROTELP 7.2 07/13/2018   (this displays SPEP labs)  Lab Results  Component Value Date   KPAFRELGTCHN 14.9 07/13/2018   LAMBDASER 16.6 07/13/2018   KAPLAMBRATIO 0.90 07/13/2018   (kappa/lambda light chains)  No results found for: HGBA, HGBA2QUANT, HGBFQUANT, HGBSQUAN (Hemoglobinopathy evaluation)   Lab Results  Component Value Date   LDH 224 (H) 07/13/2018    No results found for: IRON, TIBC, IRONPCTSAT (Iron and TIBC)  No results found for: FERRITIN  Urinalysis    Component Value Date/Time   COLORURINE YELLOW 08/22/2014 0908   APPEARANCEUR CLEAR 08/22/2014 0908   LABSPEC 1.021 08/22/2014 0908   PHURINE 6.0 08/22/2014 0908  GLUCOSEU NEGATIVE 08/22/2014 0908   HGBUR NEGATIVE 08/22/2014 0908   BILIRUBINUR NEGATIVE 08/22/2014 0908   KETONESUR NEGATIVE 08/22/2014 0908   PROTEINUR NEGATIVE 08/22/2014 0908   UROBILINOGEN 0.2 08/22/2014 0908   NITRITE NEGATIVE 08/22/2014 0908   LEUKOCYTESUR SMALL (A) 08/22/2014 0908     STUDIES: No results found.   ASSESSMENT: 85 y.o. [place], [place] woman with Monoclonal gammopathy of unknown significance initially diagnosed as part of evaluation for neuropathy February 2019    (1) Labs, 11/20/17: SPEP -- M-Spike 1.0g/dL, SIFE -- IgG lambda; IgG 1684, IgA 117, IgM 69; UPEP & UIFE -- no abnormal protein findings; Vit B12 327, Folate RBC 998.     (2) Skeletal Survey, 12/01/17: No lytic skeletal lesions.    PLAN:  I left Armetta a message to reassuring her that it was okay for her not to have lab work here this year and it is preferable to avoid coming here given the current pandemic.  Instead she will see me again next May and we will obtain lab work 2 weeks before that visit  I did ask her to call if she had any questions or  concerns   [facility] [place], [place] [address] Tel. [phone]    Fax. [phone]   I, Wilburn Mylar, am acting as scribe for Dr. Virgie Dad. Magrinat.  I, Lurline Del MD, have reviewed the above documentation for accuracy and completeness, and I agree with the above.

## 2019-05-16 ENCOUNTER — Inpatient Hospital Stay: Payer: Medicare Other

## 2019-05-16 ENCOUNTER — Inpatient Hospital Stay: Payer: Medicare Other | Attending: Oncology | Admitting: Oncology

## 2019-05-16 DIAGNOSIS — D472 Monoclonal gammopathy: Secondary | ICD-10-CM

## 2019-08-09 ENCOUNTER — Other Ambulatory Visit: Payer: Self-pay | Admitting: Cardiovascular Disease

## 2019-10-07 ENCOUNTER — Ambulatory Visit: Payer: Medicare Other | Attending: Internal Medicine

## 2019-10-07 DIAGNOSIS — Z23 Encounter for immunization: Secondary | ICD-10-CM | POA: Insufficient documentation

## 2019-10-07 NOTE — Progress Notes (Signed)
   Covid-19 Vaccination Clinic  Name:  Carol Woods    MRN: [id number] DOB: May 03, 1934  10/07/2019  Carol Woods was observed post Covid-19 immunization for 15 minutes without incidence. She was provided with Vaccine Information Sheet and instruction to access the V-Safe system.   Carol Woods was instructed to call 911 with any severe reactions post vaccine: Marland Kitchen Difficulty breathing  . Swelling of your face and throat  . A fast heartbeat  . A bad rash all over your body  . Dizziness and weakness    Immunizations Administered    Name Date Dose VIS Date Route   Pfizer COVID-19 Vaccine 10/07/2019 11:13 AM 0.3 mL 09/02/2019 Intramuscular   Manufacturer: [employer]   Lot: [id number]   [place]: [id number]

## 2019-10-13 ENCOUNTER — Ambulatory Visit: Payer: Medicare Other | Admitting: Cardiovascular Disease

## 2019-10-15 ENCOUNTER — Ambulatory Visit: Payer: Medicare Other

## 2019-10-17 ENCOUNTER — Ambulatory Visit: Payer: Medicare Other

## 2019-10-26 ENCOUNTER — Ambulatory Visit: Payer: Medicare Other | Admitting: Cardiovascular Disease

## 2019-10-28 ENCOUNTER — Ambulatory Visit: Payer: Medicare PPO | Attending: Internal Medicine

## 2019-10-28 DIAGNOSIS — Z23 Encounter for immunization: Secondary | ICD-10-CM | POA: Insufficient documentation

## 2019-10-28 NOTE — Progress Notes (Signed)
   Covid-19 Vaccination Clinic  Name:  ANUHYA SURGEON    MRN: [id number] DOB: June 27, 1934  10/28/2019  Ms. Bew was observed post Covid-19 immunization for 15 minutes without incidence. She was provided with Vaccine Information Sheet and instruction to access the V-Safe system.   Ms. Inzunza was instructed to call 911 with any severe reactions post vaccine: Marland Kitchen Difficulty breathing  . Swelling of your face and throat  . A fast heartbeat  . A bad rash all over your body  . Dizziness and weakness    Immunizations Administered    Name Date Dose VIS Date Route   Pfizer COVID-19 Vaccine 10/28/2019 10:42 AM 0.3 mL 09/02/2019 Intramuscular   Manufacturer: [place]   Lot: [id number]   [place]: [id number]

## 2019-11-21 ENCOUNTER — Encounter: Payer: Self-pay | Admitting: Cardiovascular Disease

## 2019-11-21 ENCOUNTER — Other Ambulatory Visit: Payer: Self-pay

## 2019-11-21 ENCOUNTER — Ambulatory Visit: Payer: Medicare PPO | Admitting: Cardiovascular Disease

## 2019-11-21 VITALS — BP 135/67 | HR 70 | Ht 61.0 in | Wt 130.0 lb

## 2019-11-21 DIAGNOSIS — K219 Gastro-esophageal reflux disease without esophagitis: Secondary | ICD-10-CM

## 2019-11-21 DIAGNOSIS — R0789 Other chest pain: Secondary | ICD-10-CM

## 2019-11-21 DIAGNOSIS — I10 Essential (primary) hypertension: Secondary | ICD-10-CM | POA: Diagnosis not present

## 2019-11-21 HISTORY — DX: Other chest pain: R07.89

## 2019-11-21 NOTE — Patient Instructions (Signed)
Medication Instructions:  Your physician recommends that you continue on your current medications as directed. Please refer to the Current Medication list given to you today.   *If you need a refill on your cardiac medications before your next appointment, please call your pharmacy*  Lab Work: NONE  Testing/Procedures: NONE  Follow-Up: AS NEEDED    

## 2019-11-21 NOTE — Addendum Note (Signed)
Addended by: Crissie Reese on: 11/21/2019 09:32 AM   Modules accepted: Orders

## 2019-11-21 NOTE — Progress Notes (Signed)
Cardiology Office Note   Date:  11/21/2019   ID:  Carol Woods, DOB September 29, 1933, MRN [id number]  PCP:  Shon Baton, MD  Cardiologist:   Skeet Latch, MD   No chief complaint on file.    History of Present Illness: Carol Woods is a 86 y.o. female with hypertension, white coat hypertension, hyperlipidemia, RBBB, GERD, and atypical chest pain here for follow-up.  She was initially seen 06/2018 for chest pain.  She saw Caprice Beaver, NP on 05/28/2018 and reported labile hypertension.  She also reported chest tightness.  Lisinopril was increased from 20mg  to 40 mg 04/2018.  At that appointment lisinopril was decreased back to 20 mg and amlodipine was increased from 2.5 mg to 5 mg.  She was referred to cardiology for further evaluation.  At the time of her initial evaluation she was under a lot of stress and had chronic back pain.  She notes that for the last 2 or 3 months she has been under a lot of stress.  She also has chronic back pain.  At that appointment amlodipine was increased to 7.5 mg and she was encouraged to start back exercising.  Her chest pain seem to be atypical and most likely due to acid reflux.  She followed up with Cherlynn Polo, PA-C, on 09/2018 and continued to have occasional episodes of chest pain only when lying down.  She was otherwise doing well.  [name] has been doing well from a cardiac standpoint.  She has been struggling with social isolation due to the pandemic.  She lives alone and has no family around.  She does have some friends that she sees occasionally.  She notes that she is not getting much exercise lately.  She hopes to start by walking a little more.  She struggles due to orthopedic pain.  Her shoulder needs to be replaced and she has back pain that is causing tingling and numbness in her hands and feet.  She is trying to decide whether she wants to have surgery on her back and is quite certain she is ultimately going to need her shoulder replaced.  She  has not experienced any exertional chest discomfort.  Only time she has chest discomfort is when lying in bed.  She has no orthopnea or PND.  She does get a little short of breath with exercise but thinks that because she has not been doing it very much lately.  She brings a log of her blood pressure showing that it is ranging from the 120s to the 140s over 50s to 60s.  On average is around 120/60.  She had both COVID-19 vaccine doses.   Past Medical History:  Diagnosis Date  . Arthritis    "back; shoulders" (08/31/2014)  . Atypical chest pain 11/21/2019  . Cataracts, both eyes   . Essential hypertension 07/13/2018  . GERD (gastroesophageal reflux disease)    OTC, changed diet  . High cholesterol   . Hypertension   . Hypothyroidism     Past Surgical History:  Procedure Laterality Date  . ANTERIOR CERVICAL DECOMP/DISCECTOMY FUSION  02/2010   Dr Ellene Route  . BACK SURGERY    . COLONOSCOPY W/ POLYPECTOMY    . DILATION AND CURETTAGE OF UTERUS    . POSTERIOR LUMBAR FUSION  04/2010   Dr Ellene Route  . TONSILLECTOMY     age 22  . TOTAL SHOULDER ARTHROPLASTY Right 08/31/2014  . TOTAL SHOULDER ARTHROPLASTY Right 08/31/2014   Procedure: TOTAL SHOULDER ARTHROPLASTY;  Surgeon: Nita Sells, MD;  Location: [place];  Service: Orthopedics;  Laterality: Right;  Right total shoulder arthroplasty     Current Outpatient Medications  Medication Sig Dispense Refill  . amLODipine (NORVASC) 2.5 MG tablet TAKE 3 TABLETS IN THE EVENING FOR BLOOD PRESSURE. 270 tablet 0  . aspirin EC 81 MG tablet Take 81 mg by mouth daily.    Marland Kitchen b complex vitamins capsule Take 1 capsule by mouth daily.    Marland Kitchen CALCIUM PO Take 1 tablet by mouth daily.    . Cholecalciferol (VITAMIN D PO) Take 1 tablet by mouth daily.    . clindamycin (CLEOCIN T) 1 % external solution Apply 1 application topically daily.     . cyanocobalamin 1000 MCG tablet Take 1,000 mcg by mouth daily.    Marland Kitchen levothyroxine (SYNTHROID, LEVOTHROID) 112 MCG tablet  Take 112 mcg by mouth daily before breakfast.    . lisinopril (PRINIVIL,ZESTRIL) 20 MG tablet     . Omega-3 Fatty Acids (FISH OIL PO) Take 1 capsule by mouth daily.     No current facility-administered medications for this visit.    Allergies:   Patient has no known allergies.    Social History:  The patient  reports that she quit smoking about 35 years ago. Her smoking use included cigarettes. She has a 37.50 pack-year smoking history. She has never used smokeless tobacco. She reports current alcohol use of about 12.0 standard drinks of alcohol per week. She reports that she does not use drugs.   Family History:  The patient's family history includes Alzheimer's disease in her mother; Breast cancer in her mother and sister; Heart attack in her cousin; Heart disease in her father; Hypertension in her sister; Hypothyroidism in her father; Kidney disease in her sister; Ovarian cancer in her mother; Parkinson's disease in her father; Prostate cancer in her brother.    ROS:  Please see the history of present illness.   Otherwise, review of systems are positive for none.   All other systems are reviewed and negative.    PHYSICAL EXAM: VS:  BP 135/67   Pulse 70   Ht 5\' 1"  (1.549 m)   Wt 130 lb (59 kg)   SpO2 96%   BMI 24.56 kg/m  , BMI Body mass index is 24.56 kg/m. GENERAL:  Well appearing HEENT: Pupils equal round and reactive, fundi not visualized, oral mucosa unremarkable NECK:  No jugular venous distention, waveform within normal limits, carotid upstroke brisk and symmetric, no bruits LUNGS:  Clear to auscultation bilaterally HEART:  RRR.  PMI not displaced or sustained,S1 and S2 within normal limits, no S3, no S4, no clicks, no rubs, no murmurs ABD:  Flat, positive bowel sounds normal in frequency in pitch, no bruits, no rebound, no guarding, no midline pulsatile mass, no hepatomegaly, no splenomegaly EXT:  2 plus pulses throughout, no edema, no cyanosis no clubbing SKIN:  No rashes  no nodules NEURO:  Cranial nerves II through XII grossly intact, motor grossly intact throughout PSYCH:  Cognitively intact, oriented to person place and time    EKG:  EKG is ordered today. The ekg ordered 05/28/2018 demonstrates sinus rhythm.  Rate 70 bpm.  Right bundle branch block.  LAFB. 11/21/2019: Sinus rhythm.  Rate 70 bpm.  Right bundle branch block.  LAFB.  LVH.    Recent Labs: No results found for requested labs within last 8760 hours.   05/28/2018: Sodium 140, potassium 3.8, BUN 15, creatinine 0.7 WBC 5.96, hemoglobin 13.3, hematocrit  41.4, platelets 234  03/16/2018: Total cholesterol 185, triglycerides 137, HDL 43, LDL 115 Hemoglobin A1c 5.8%  Lipid Panel No results found for: CHOL, TRIG, HDL, CHOLHDL, VLDL, LDLCALC, LDLDIRECT    Wt Readings from Last 3 Encounters:  11/21/19 130 lb (59 kg)  10/12/18 131 lb 9.6 oz (59.7 kg)  07/13/18 134 lb (60.8 kg)      ASSESSMENT AND PLAN:  # Hypertension:   Blood pressures well-controlled.  Continue amlodipine and lisinopril.  # Chest pain: # GERD: Ms. Bardy only has chest tightness when lying in bed.  She is quite certain that this is due to acid reflux and seems to have improved with not eating large meals late at night.  She is not interested in any medical therapy.    Current medicines are reviewed at length with the patient today.  The patient does not have concerns regarding medicines.  The following changes have been made: none  Labs/ tests ordered today include:  No orders of the defined types were placed in this encounter.    Disposition:   FU with Camie Hauss C. Oval Linsey, MD, [facility] as needed    Signed, [place] Oval Linsey, MD, [facility]  11/21/2019 9:09 AM    [place]

## 2019-12-09 ENCOUNTER — Other Ambulatory Visit: Payer: Self-pay | Admitting: Cardiovascular Disease

## 2020-02-28 DIAGNOSIS — M810 Age-related osteoporosis without current pathological fracture: Secondary | ICD-10-CM | POA: Diagnosis not present

## 2020-03-01 DIAGNOSIS — M810 Age-related osteoporosis without current pathological fracture: Secondary | ICD-10-CM | POA: Diagnosis not present

## 2020-03-22 DIAGNOSIS — E7849 Other hyperlipidemia: Secondary | ICD-10-CM | POA: Diagnosis not present

## 2020-03-22 DIAGNOSIS — R739 Hyperglycemia, unspecified: Secondary | ICD-10-CM | POA: Diagnosis not present

## 2020-03-22 DIAGNOSIS — E538 Deficiency of other specified B group vitamins: Secondary | ICD-10-CM | POA: Diagnosis not present

## 2020-03-22 DIAGNOSIS — E038 Other specified hypothyroidism: Secondary | ICD-10-CM | POA: Diagnosis not present

## 2020-03-22 DIAGNOSIS — Z0000 Encounter for general adult medical examination without abnormal findings: Secondary | ICD-10-CM | POA: Diagnosis not present

## 2020-03-22 DIAGNOSIS — M859 Disorder of bone density and structure, unspecified: Secondary | ICD-10-CM | POA: Diagnosis not present

## 2020-03-22 DIAGNOSIS — I10 Essential (primary) hypertension: Secondary | ICD-10-CM | POA: Diagnosis not present

## 2020-04-02 DIAGNOSIS — R1030 Lower abdominal pain, unspecified: Secondary | ICD-10-CM | POA: Diagnosis not present

## 2020-04-02 DIAGNOSIS — K2970 Gastritis, unspecified, without bleeding: Secondary | ICD-10-CM | POA: Diagnosis not present

## 2020-04-02 DIAGNOSIS — I10 Essential (primary) hypertension: Secondary | ICD-10-CM | POA: Diagnosis not present

## 2020-04-02 DIAGNOSIS — R1013 Epigastric pain: Secondary | ICD-10-CM | POA: Diagnosis not present

## 2020-04-11 DIAGNOSIS — M4712 Other spondylosis with myelopathy, cervical region: Secondary | ICD-10-CM | POA: Diagnosis not present

## 2020-04-11 DIAGNOSIS — M47814 Spondylosis without myelopathy or radiculopathy, thoracic region: Secondary | ICD-10-CM | POA: Diagnosis not present

## 2020-04-17 DIAGNOSIS — M545 Low back pain: Secondary | ICD-10-CM | POA: Diagnosis not present

## 2020-04-17 DIAGNOSIS — F418 Other specified anxiety disorders: Secondary | ICD-10-CM | POA: Diagnosis not present

## 2020-04-17 DIAGNOSIS — Z0000 Encounter for general adult medical examination without abnormal findings: Secondary | ICD-10-CM | POA: Diagnosis not present

## 2020-04-17 DIAGNOSIS — F4321 Adjustment disorder with depressed mood: Secondary | ICD-10-CM | POA: Diagnosis not present

## 2020-04-17 DIAGNOSIS — M810 Age-related osteoporosis without current pathological fracture: Secondary | ICD-10-CM | POA: Diagnosis not present

## 2020-04-17 DIAGNOSIS — H539 Unspecified visual disturbance: Secondary | ICD-10-CM | POA: Diagnosis not present

## 2020-04-17 DIAGNOSIS — J309 Allergic rhinitis, unspecified: Secondary | ICD-10-CM | POA: Diagnosis not present

## 2020-04-17 DIAGNOSIS — K2970 Gastritis, unspecified, without bleeding: Secondary | ICD-10-CM | POA: Diagnosis not present

## 2020-04-17 DIAGNOSIS — Z1339 Encounter for screening examination for other mental health and behavioral disorders: Secondary | ICD-10-CM | POA: Diagnosis not present

## 2020-04-17 DIAGNOSIS — H3530 Unspecified macular degeneration: Secondary | ICD-10-CM | POA: Diagnosis not present

## 2020-04-17 DIAGNOSIS — Z1331 Encounter for screening for depression: Secondary | ICD-10-CM | POA: Diagnosis not present

## 2020-04-18 ENCOUNTER — Telehealth: Payer: Self-pay | Admitting: Cardiovascular Disease

## 2020-04-18 NOTE — Telephone Encounter (Signed)
Spoke with patient to schedule follow up visit from recall list but Patient stated she was dismissed from Dr Blenda Mounts care and only make appointments as needed

## 2020-05-01 DIAGNOSIS — Z961 Presence of intraocular lens: Secondary | ICD-10-CM | POA: Diagnosis not present

## 2020-05-01 DIAGNOSIS — H40023 Open angle with borderline findings, high risk, bilateral: Secondary | ICD-10-CM | POA: Diagnosis not present

## 2020-05-03 DIAGNOSIS — R262 Difficulty in walking, not elsewhere classified: Secondary | ICD-10-CM | POA: Diagnosis not present

## 2020-05-03 DIAGNOSIS — M4712 Other spondylosis with myelopathy, cervical region: Secondary | ICD-10-CM | POA: Diagnosis not present

## 2020-05-03 DIAGNOSIS — R2681 Unsteadiness on feet: Secondary | ICD-10-CM | POA: Diagnosis not present

## 2020-05-07 DIAGNOSIS — R2681 Unsteadiness on feet: Secondary | ICD-10-CM | POA: Diagnosis not present

## 2020-05-07 DIAGNOSIS — R262 Difficulty in walking, not elsewhere classified: Secondary | ICD-10-CM | POA: Diagnosis not present

## 2020-05-07 DIAGNOSIS — M4712 Other spondylosis with myelopathy, cervical region: Secondary | ICD-10-CM | POA: Diagnosis not present

## 2020-05-09 DIAGNOSIS — R2681 Unsteadiness on feet: Secondary | ICD-10-CM | POA: Diagnosis not present

## 2020-05-09 DIAGNOSIS — M4712 Other spondylosis with myelopathy, cervical region: Secondary | ICD-10-CM | POA: Diagnosis not present

## 2020-05-09 DIAGNOSIS — R262 Difficulty in walking, not elsewhere classified: Secondary | ICD-10-CM | POA: Diagnosis not present

## 2020-05-11 ENCOUNTER — Other Ambulatory Visit: Payer: Self-pay | Admitting: Neurological Surgery

## 2020-05-11 DIAGNOSIS — M4712 Other spondylosis with myelopathy, cervical region: Secondary | ICD-10-CM

## 2020-05-12 ENCOUNTER — Other Ambulatory Visit: Payer: Medicare PPO

## 2020-05-14 ENCOUNTER — Other Ambulatory Visit: Payer: Self-pay

## 2020-05-14 ENCOUNTER — Ambulatory Visit
Admission: RE | Admit: 2020-05-14 | Discharge: 2020-05-14 | Disposition: A | Discharge status: Home / Self Care | Payer: Medicare PPO | Source: Ambulatory Visit | Attending: Neurological Surgery | Admitting: Neurological Surgery

## 2020-05-14 DIAGNOSIS — M4712 Other spondylosis with myelopathy, cervical region: Secondary | ICD-10-CM

## 2020-05-14 DIAGNOSIS — M4802 Spinal stenosis, cervical region: Secondary | ICD-10-CM | POA: Diagnosis not present

## 2020-05-16 DIAGNOSIS — M4712 Other spondylosis with myelopathy, cervical region: Secondary | ICD-10-CM | POA: Diagnosis not present

## 2020-05-16 DIAGNOSIS — R262 Difficulty in walking, not elsewhere classified: Secondary | ICD-10-CM | POA: Diagnosis not present

## 2020-05-16 DIAGNOSIS — R2681 Unsteadiness on feet: Secondary | ICD-10-CM | POA: Diagnosis not present

## 2020-05-21 DIAGNOSIS — D1801 Hemangioma of skin and subcutaneous tissue: Secondary | ICD-10-CM | POA: Diagnosis not present

## 2020-05-21 DIAGNOSIS — L718 Other rosacea: Secondary | ICD-10-CM | POA: Diagnosis not present

## 2020-05-21 DIAGNOSIS — L565 Disseminated superficial actinic porokeratosis (DSAP): Secondary | ICD-10-CM | POA: Diagnosis not present

## 2020-05-21 DIAGNOSIS — L821 Other seborrheic keratosis: Secondary | ICD-10-CM | POA: Diagnosis not present

## 2020-05-21 DIAGNOSIS — R262 Difficulty in walking, not elsewhere classified: Secondary | ICD-10-CM | POA: Diagnosis not present

## 2020-05-21 DIAGNOSIS — R2681 Unsteadiness on feet: Secondary | ICD-10-CM | POA: Diagnosis not present

## 2020-05-21 DIAGNOSIS — M4712 Other spondylosis with myelopathy, cervical region: Secondary | ICD-10-CM | POA: Diagnosis not present

## 2020-05-23 DIAGNOSIS — M4712 Other spondylosis with myelopathy, cervical region: Secondary | ICD-10-CM | POA: Diagnosis not present

## 2020-05-23 DIAGNOSIS — R2681 Unsteadiness on feet: Secondary | ICD-10-CM | POA: Diagnosis not present

## 2020-05-23 DIAGNOSIS — R262 Difficulty in walking, not elsewhere classified: Secondary | ICD-10-CM | POA: Diagnosis not present

## 2020-05-25 DIAGNOSIS — R2681 Unsteadiness on feet: Secondary | ICD-10-CM | POA: Diagnosis not present

## 2020-05-25 DIAGNOSIS — R262 Difficulty in walking, not elsewhere classified: Secondary | ICD-10-CM | POA: Diagnosis not present

## 2020-05-25 DIAGNOSIS — M4712 Other spondylosis with myelopathy, cervical region: Secondary | ICD-10-CM | POA: Diagnosis not present

## 2020-05-29 DIAGNOSIS — M4712 Other spondylosis with myelopathy, cervical region: Secondary | ICD-10-CM | POA: Diagnosis not present

## 2020-05-29 DIAGNOSIS — R262 Difficulty in walking, not elsewhere classified: Secondary | ICD-10-CM | POA: Diagnosis not present

## 2020-05-29 DIAGNOSIS — R2681 Unsteadiness on feet: Secondary | ICD-10-CM | POA: Diagnosis not present

## 2020-05-30 DIAGNOSIS — M4712 Other spondylosis with myelopathy, cervical region: Secondary | ICD-10-CM | POA: Diagnosis not present

## 2020-05-30 DIAGNOSIS — R2681 Unsteadiness on feet: Secondary | ICD-10-CM | POA: Diagnosis not present

## 2020-05-30 DIAGNOSIS — R262 Difficulty in walking, not elsewhere classified: Secondary | ICD-10-CM | POA: Diagnosis not present

## 2020-06-04 DIAGNOSIS — R262 Difficulty in walking, not elsewhere classified: Secondary | ICD-10-CM | POA: Diagnosis not present

## 2020-06-04 DIAGNOSIS — M4712 Other spondylosis with myelopathy, cervical region: Secondary | ICD-10-CM | POA: Diagnosis not present

## 2020-06-04 DIAGNOSIS — R2681 Unsteadiness on feet: Secondary | ICD-10-CM | POA: Diagnosis not present

## 2020-06-06 DIAGNOSIS — R262 Difficulty in walking, not elsewhere classified: Secondary | ICD-10-CM | POA: Diagnosis not present

## 2020-06-06 DIAGNOSIS — M4712 Other spondylosis with myelopathy, cervical region: Secondary | ICD-10-CM | POA: Diagnosis not present

## 2020-06-06 DIAGNOSIS — R2681 Unsteadiness on feet: Secondary | ICD-10-CM | POA: Diagnosis not present

## 2020-06-08 DIAGNOSIS — R262 Difficulty in walking, not elsewhere classified: Secondary | ICD-10-CM | POA: Diagnosis not present

## 2020-06-08 DIAGNOSIS — M4712 Other spondylosis with myelopathy, cervical region: Secondary | ICD-10-CM | POA: Diagnosis not present

## 2020-06-08 DIAGNOSIS — R2681 Unsteadiness on feet: Secondary | ICD-10-CM | POA: Diagnosis not present

## 2020-06-11 DIAGNOSIS — R262 Difficulty in walking, not elsewhere classified: Secondary | ICD-10-CM | POA: Diagnosis not present

## 2020-06-11 DIAGNOSIS — M4712 Other spondylosis with myelopathy, cervical region: Secondary | ICD-10-CM | POA: Diagnosis not present

## 2020-06-11 DIAGNOSIS — R2681 Unsteadiness on feet: Secondary | ICD-10-CM | POA: Diagnosis not present

## 2020-06-15 DIAGNOSIS — R262 Difficulty in walking, not elsewhere classified: Secondary | ICD-10-CM | POA: Diagnosis not present

## 2020-06-15 DIAGNOSIS — M4712 Other spondylosis with myelopathy, cervical region: Secondary | ICD-10-CM | POA: Diagnosis not present

## 2020-06-15 DIAGNOSIS — R2681 Unsteadiness on feet: Secondary | ICD-10-CM | POA: Diagnosis not present

## 2020-06-18 DIAGNOSIS — M4712 Other spondylosis with myelopathy, cervical region: Secondary | ICD-10-CM | POA: Diagnosis not present

## 2020-06-18 DIAGNOSIS — R262 Difficulty in walking, not elsewhere classified: Secondary | ICD-10-CM | POA: Diagnosis not present

## 2020-06-18 DIAGNOSIS — R2681 Unsteadiness on feet: Secondary | ICD-10-CM | POA: Diagnosis not present

## 2020-06-25 DIAGNOSIS — R2681 Unsteadiness on feet: Secondary | ICD-10-CM | POA: Diagnosis not present

## 2020-06-25 DIAGNOSIS — R262 Difficulty in walking, not elsewhere classified: Secondary | ICD-10-CM | POA: Diagnosis not present

## 2020-06-25 DIAGNOSIS — M4712 Other spondylosis with myelopathy, cervical region: Secondary | ICD-10-CM | POA: Diagnosis not present

## 2020-06-26 ENCOUNTER — Ambulatory Visit: Payer: Medicare PPO | Attending: Internal Medicine

## 2020-06-26 DIAGNOSIS — Z23 Encounter for immunization: Secondary | ICD-10-CM

## 2020-06-26 NOTE — Progress Notes (Signed)
   Covid-19 Vaccination Clinic  Name:  AJANAE VIRAG    MRN: [id number] DOB: [birth year]  06/26/2020  Ms. Domanski was observed post Covid-19 immunization for 15 minutes without incident. She was provided with Vaccine Information Sheet and instruction to access the V-Safe system.   Ms. Ewton was instructed to call 911 with any severe reactions post vaccine: Marland Kitchen Difficulty breathing  . Swelling of face and throat  . A fast heartbeat  . A bad rash all over body  . Dizziness and weakness

## 2020-06-27 DIAGNOSIS — M4712 Other spondylosis with myelopathy, cervical region: Secondary | ICD-10-CM | POA: Diagnosis not present

## 2020-06-27 DIAGNOSIS — R2681 Unsteadiness on feet: Secondary | ICD-10-CM | POA: Diagnosis not present

## 2020-06-27 DIAGNOSIS — R262 Difficulty in walking, not elsewhere classified: Secondary | ICD-10-CM | POA: Diagnosis not present

## 2020-06-29 DIAGNOSIS — R262 Difficulty in walking, not elsewhere classified: Secondary | ICD-10-CM | POA: Diagnosis not present

## 2020-06-29 DIAGNOSIS — M4712 Other spondylosis with myelopathy, cervical region: Secondary | ICD-10-CM | POA: Diagnosis not present

## 2020-06-29 DIAGNOSIS — R2681 Unsteadiness on feet: Secondary | ICD-10-CM | POA: Diagnosis not present

## 2020-07-02 DIAGNOSIS — R2681 Unsteadiness on feet: Secondary | ICD-10-CM | POA: Diagnosis not present

## 2020-07-02 DIAGNOSIS — R262 Difficulty in walking, not elsewhere classified: Secondary | ICD-10-CM | POA: Diagnosis not present

## 2020-07-02 DIAGNOSIS — M4712 Other spondylosis with myelopathy, cervical region: Secondary | ICD-10-CM | POA: Diagnosis not present

## 2020-07-03 DIAGNOSIS — Z20822 Contact with and (suspected) exposure to covid-19: Secondary | ICD-10-CM | POA: Diagnosis not present

## 2020-07-04 DIAGNOSIS — R262 Difficulty in walking, not elsewhere classified: Secondary | ICD-10-CM | POA: Diagnosis not present

## 2020-07-04 DIAGNOSIS — M4712 Other spondylosis with myelopathy, cervical region: Secondary | ICD-10-CM | POA: Diagnosis not present

## 2020-07-04 DIAGNOSIS — R2681 Unsteadiness on feet: Secondary | ICD-10-CM | POA: Diagnosis not present

## 2020-10-26 DIAGNOSIS — E538 Deficiency of other specified B group vitamins: Secondary | ICD-10-CM | POA: Diagnosis not present

## 2020-10-26 DIAGNOSIS — E039 Hypothyroidism, unspecified: Secondary | ICD-10-CM | POA: Diagnosis not present

## 2020-10-26 DIAGNOSIS — I10 Essential (primary) hypertension: Secondary | ICD-10-CM | POA: Diagnosis not present

## 2020-10-26 DIAGNOSIS — F4321 Adjustment disorder with depressed mood: Secondary | ICD-10-CM | POA: Diagnosis not present

## 2020-10-26 DIAGNOSIS — R809 Proteinuria, unspecified: Secondary | ICD-10-CM | POA: Diagnosis not present

## 2020-10-26 DIAGNOSIS — M8580 Other specified disorders of bone density and structure, unspecified site: Secondary | ICD-10-CM | POA: Diagnosis not present

## 2020-10-26 DIAGNOSIS — G629 Polyneuropathy, unspecified: Secondary | ICD-10-CM | POA: Diagnosis not present

## 2020-10-26 DIAGNOSIS — E785 Hyperlipidemia, unspecified: Secondary | ICD-10-CM | POA: Diagnosis not present

## 2020-10-26 DIAGNOSIS — I4510 Unspecified right bundle-branch block: Secondary | ICD-10-CM | POA: Diagnosis not present

## 2020-10-26 DIAGNOSIS — D472 Monoclonal gammopathy: Secondary | ICD-10-CM | POA: Diagnosis not present

## 2020-10-26 DIAGNOSIS — R269 Unspecified abnormalities of gait and mobility: Secondary | ICD-10-CM | POA: Diagnosis not present

## 2020-10-26 DIAGNOSIS — E559 Vitamin D deficiency, unspecified: Secondary | ICD-10-CM | POA: Diagnosis not present

## 2020-11-14 DIAGNOSIS — Z961 Presence of intraocular lens: Secondary | ICD-10-CM | POA: Diagnosis not present

## 2020-11-14 DIAGNOSIS — H353131 Nonexudative age-related macular degeneration, bilateral, early dry stage: Secondary | ICD-10-CM | POA: Diagnosis not present

## 2020-11-14 DIAGNOSIS — H40023 Open angle with borderline findings, high risk, bilateral: Secondary | ICD-10-CM | POA: Diagnosis not present

## 2020-11-14 DIAGNOSIS — H5201 Hypermetropia, right eye: Secondary | ICD-10-CM | POA: Diagnosis not present

## 2020-12-24 DIAGNOSIS — Z6825 Body mass index (BMI) 25.0-25.9, adult: Secondary | ICD-10-CM | POA: Diagnosis not present

## 2020-12-24 DIAGNOSIS — M810 Age-related osteoporosis without current pathological fracture: Secondary | ICD-10-CM | POA: Diagnosis not present

## 2020-12-24 DIAGNOSIS — Z1231 Encounter for screening mammogram for malignant neoplasm of breast: Secondary | ICD-10-CM | POA: Diagnosis not present

## 2020-12-24 DIAGNOSIS — Z124 Encounter for screening for malignant neoplasm of cervix: Secondary | ICD-10-CM | POA: Diagnosis not present

## 2020-12-24 DIAGNOSIS — Z01419 Encounter for gynecological examination (general) (routine) without abnormal findings: Secondary | ICD-10-CM | POA: Diagnosis not present

## 2020-12-24 DIAGNOSIS — Z803 Family history of malignant neoplasm of breast: Secondary | ICD-10-CM | POA: Diagnosis not present

## 2020-12-24 DIAGNOSIS — N3941 Urge incontinence: Secondary | ICD-10-CM | POA: Diagnosis not present

## 2020-12-24 DIAGNOSIS — Z01411 Encounter for gynecological examination (general) (routine) with abnormal findings: Secondary | ICD-10-CM | POA: Diagnosis not present

## 2021-01-03 DIAGNOSIS — N3941 Urge incontinence: Secondary | ICD-10-CM | POA: Diagnosis not present

## 2021-01-03 DIAGNOSIS — N3942 Incontinence without sensory awareness: Secondary | ICD-10-CM | POA: Diagnosis not present

## 2021-01-03 DIAGNOSIS — M6281 Muscle weakness (generalized): Secondary | ICD-10-CM | POA: Diagnosis not present

## 2021-01-22 DIAGNOSIS — N3942 Incontinence without sensory awareness: Secondary | ICD-10-CM | POA: Diagnosis not present

## 2021-01-22 DIAGNOSIS — N3941 Urge incontinence: Secondary | ICD-10-CM | POA: Diagnosis not present

## 2021-01-22 DIAGNOSIS — M6281 Muscle weakness (generalized): Secondary | ICD-10-CM | POA: Diagnosis not present

## 2021-02-06 DIAGNOSIS — M6281 Muscle weakness (generalized): Secondary | ICD-10-CM | POA: Diagnosis not present

## 2021-02-06 DIAGNOSIS — N3942 Incontinence without sensory awareness: Secondary | ICD-10-CM | POA: Diagnosis not present

## 2021-02-06 DIAGNOSIS — N3941 Urge incontinence: Secondary | ICD-10-CM | POA: Diagnosis not present

## 2021-02-13 DIAGNOSIS — N3942 Incontinence without sensory awareness: Secondary | ICD-10-CM | POA: Diagnosis not present

## 2021-02-13 DIAGNOSIS — N3941 Urge incontinence: Secondary | ICD-10-CM | POA: Diagnosis not present

## 2021-02-13 DIAGNOSIS — M6281 Muscle weakness (generalized): Secondary | ICD-10-CM | POA: Diagnosis not present

## 2021-02-20 DIAGNOSIS — M6281 Muscle weakness (generalized): Secondary | ICD-10-CM | POA: Diagnosis not present

## 2021-02-20 DIAGNOSIS — N3942 Incontinence without sensory awareness: Secondary | ICD-10-CM | POA: Diagnosis not present

## 2021-02-20 DIAGNOSIS — N3941 Urge incontinence: Secondary | ICD-10-CM | POA: Diagnosis not present

## 2021-03-07 ENCOUNTER — Other Ambulatory Visit: Payer: Self-pay | Admitting: Neurological Surgery

## 2021-03-07 DIAGNOSIS — M47814 Spondylosis without myelopathy or radiculopathy, thoracic region: Secondary | ICD-10-CM

## 2021-03-12 DIAGNOSIS — H353132 Nonexudative age-related macular degeneration, bilateral, intermediate dry stage: Secondary | ICD-10-CM | POA: Diagnosis not present

## 2021-03-12 DIAGNOSIS — H5212 Myopia, left eye: Secondary | ICD-10-CM | POA: Diagnosis not present

## 2021-03-12 DIAGNOSIS — H532 Diplopia: Secondary | ICD-10-CM | POA: Diagnosis not present

## 2021-03-20 DIAGNOSIS — N3941 Urge incontinence: Secondary | ICD-10-CM | POA: Diagnosis not present

## 2021-03-20 DIAGNOSIS — M6281 Muscle weakness (generalized): Secondary | ICD-10-CM | POA: Diagnosis not present

## 2021-03-20 DIAGNOSIS — N3942 Incontinence without sensory awareness: Secondary | ICD-10-CM | POA: Diagnosis not present

## 2021-03-26 ENCOUNTER — Other Ambulatory Visit: Payer: Self-pay

## 2021-03-26 ENCOUNTER — Ambulatory Visit
Admission: RE | Admit: 2021-03-26 | Discharge: 2021-03-26 | Disposition: A | Discharge status: Home / Self Care | Payer: Medicare PPO | Source: Ambulatory Visit | Attending: Neurological Surgery | Admitting: Neurological Surgery

## 2021-03-26 DIAGNOSIS — M5124 Other intervertebral disc displacement, thoracic region: Secondary | ICD-10-CM | POA: Diagnosis not present

## 2021-03-26 DIAGNOSIS — M4804 Spinal stenosis, thoracic region: Secondary | ICD-10-CM | POA: Diagnosis not present

## 2021-03-26 DIAGNOSIS — R200 Anesthesia of skin: Secondary | ICD-10-CM | POA: Diagnosis not present

## 2021-03-26 DIAGNOSIS — M47814 Spondylosis without myelopathy or radiculopathy, thoracic region: Secondary | ICD-10-CM

## 2021-03-26 DIAGNOSIS — R531 Weakness: Secondary | ICD-10-CM | POA: Diagnosis not present

## 2021-03-27 DIAGNOSIS — N3942 Incontinence without sensory awareness: Secondary | ICD-10-CM | POA: Diagnosis not present

## 2021-03-27 DIAGNOSIS — M6281 Muscle weakness (generalized): Secondary | ICD-10-CM | POA: Diagnosis not present

## 2021-03-27 DIAGNOSIS — N3941 Urge incontinence: Secondary | ICD-10-CM | POA: Diagnosis not present

## 2021-04-03 DIAGNOSIS — M6281 Muscle weakness (generalized): Secondary | ICD-10-CM | POA: Diagnosis not present

## 2021-04-03 DIAGNOSIS — N3941 Urge incontinence: Secondary | ICD-10-CM | POA: Diagnosis not present

## 2021-04-03 DIAGNOSIS — N3942 Incontinence without sensory awareness: Secondary | ICD-10-CM | POA: Diagnosis not present

## 2021-04-17 DIAGNOSIS — N3941 Urge incontinence: Secondary | ICD-10-CM | POA: Diagnosis not present

## 2021-04-17 DIAGNOSIS — M6281 Muscle weakness (generalized): Secondary | ICD-10-CM | POA: Diagnosis not present

## 2021-04-17 DIAGNOSIS — N3942 Incontinence without sensory awareness: Secondary | ICD-10-CM | POA: Diagnosis not present

## 2021-04-18 DIAGNOSIS — I10 Essential (primary) hypertension: Secondary | ICD-10-CM | POA: Diagnosis not present

## 2021-04-18 DIAGNOSIS — E538 Deficiency of other specified B group vitamins: Secondary | ICD-10-CM | POA: Diagnosis not present

## 2021-04-18 DIAGNOSIS — R739 Hyperglycemia, unspecified: Secondary | ICD-10-CM | POA: Diagnosis not present

## 2021-04-18 DIAGNOSIS — E559 Vitamin D deficiency, unspecified: Secondary | ICD-10-CM | POA: Diagnosis not present

## 2021-04-18 DIAGNOSIS — E785 Hyperlipidemia, unspecified: Secondary | ICD-10-CM | POA: Diagnosis not present

## 2021-04-18 DIAGNOSIS — E039 Hypothyroidism, unspecified: Secondary | ICD-10-CM | POA: Diagnosis not present

## 2021-04-25 DIAGNOSIS — R82998 Other abnormal findings in urine: Secondary | ICD-10-CM | POA: Diagnosis not present

## 2021-04-25 DIAGNOSIS — Z0000 Encounter for general adult medical examination without abnormal findings: Secondary | ICD-10-CM | POA: Diagnosis not present

## 2021-04-25 DIAGNOSIS — G629 Polyneuropathy, unspecified: Secondary | ICD-10-CM | POA: Diagnosis not present

## 2021-04-25 DIAGNOSIS — R269 Unspecified abnormalities of gait and mobility: Secondary | ICD-10-CM | POA: Diagnosis not present

## 2021-04-25 DIAGNOSIS — R739 Hyperglycemia, unspecified: Secondary | ICD-10-CM | POA: Diagnosis not present

## 2021-04-25 DIAGNOSIS — I10 Essential (primary) hypertension: Secondary | ICD-10-CM | POA: Diagnosis not present

## 2021-04-25 DIAGNOSIS — F4321 Adjustment disorder with depressed mood: Secondary | ICD-10-CM | POA: Diagnosis not present

## 2021-04-25 DIAGNOSIS — E785 Hyperlipidemia, unspecified: Secondary | ICD-10-CM | POA: Diagnosis not present

## 2021-04-25 DIAGNOSIS — M8580 Other specified disorders of bone density and structure, unspecified site: Secondary | ICD-10-CM | POA: Diagnosis not present

## 2021-04-25 DIAGNOSIS — E039 Hypothyroidism, unspecified: Secondary | ICD-10-CM | POA: Diagnosis not present

## 2021-04-30 DIAGNOSIS — N3941 Urge incontinence: Secondary | ICD-10-CM | POA: Diagnosis not present

## 2021-04-30 DIAGNOSIS — M6281 Muscle weakness (generalized): Secondary | ICD-10-CM | POA: Diagnosis not present

## 2021-04-30 DIAGNOSIS — N3942 Incontinence without sensory awareness: Secondary | ICD-10-CM | POA: Diagnosis not present

## 2021-05-07 DIAGNOSIS — M6281 Muscle weakness (generalized): Secondary | ICD-10-CM | POA: Diagnosis not present

## 2021-05-07 DIAGNOSIS — N3942 Incontinence without sensory awareness: Secondary | ICD-10-CM | POA: Diagnosis not present

## 2021-05-07 DIAGNOSIS — N3941 Urge incontinence: Secondary | ICD-10-CM | POA: Diagnosis not present

## 2021-05-08 DIAGNOSIS — I10 Essential (primary) hypertension: Secondary | ICD-10-CM | POA: Diagnosis not present

## 2021-05-08 DIAGNOSIS — M47814 Spondylosis without myelopathy or radiculopathy, thoracic region: Secondary | ICD-10-CM | POA: Diagnosis not present

## 2021-05-15 DIAGNOSIS — H401131 Primary open-angle glaucoma, bilateral, mild stage: Secondary | ICD-10-CM | POA: Diagnosis not present

## 2021-05-21 DIAGNOSIS — N3942 Incontinence without sensory awareness: Secondary | ICD-10-CM | POA: Diagnosis not present

## 2021-05-21 DIAGNOSIS — N3941 Urge incontinence: Secondary | ICD-10-CM | POA: Diagnosis not present

## 2021-05-21 DIAGNOSIS — L738 Other specified follicular disorders: Secondary | ICD-10-CM | POA: Diagnosis not present

## 2021-05-21 DIAGNOSIS — D225 Melanocytic nevi of trunk: Secondary | ICD-10-CM | POA: Diagnosis not present

## 2021-05-21 DIAGNOSIS — L570 Actinic keratosis: Secondary | ICD-10-CM | POA: Diagnosis not present

## 2021-05-21 DIAGNOSIS — L218 Other seborrheic dermatitis: Secondary | ICD-10-CM | POA: Diagnosis not present

## 2021-05-21 DIAGNOSIS — D1801 Hemangioma of skin and subcutaneous tissue: Secondary | ICD-10-CM | POA: Diagnosis not present

## 2021-05-21 DIAGNOSIS — M6281 Muscle weakness (generalized): Secondary | ICD-10-CM | POA: Diagnosis not present

## 2021-05-21 DIAGNOSIS — L821 Other seborrheic keratosis: Secondary | ICD-10-CM | POA: Diagnosis not present

## 2021-05-21 DIAGNOSIS — L565 Disseminated superficial actinic porokeratosis (DSAP): Secondary | ICD-10-CM | POA: Diagnosis not present

## 2021-05-21 DIAGNOSIS — L814 Other melanin hyperpigmentation: Secondary | ICD-10-CM | POA: Diagnosis not present

## 2021-06-11 DIAGNOSIS — N3941 Urge incontinence: Secondary | ICD-10-CM | POA: Diagnosis not present

## 2021-06-11 DIAGNOSIS — N3942 Incontinence without sensory awareness: Secondary | ICD-10-CM | POA: Diagnosis not present

## 2021-06-11 DIAGNOSIS — M6281 Muscle weakness (generalized): Secondary | ICD-10-CM | POA: Diagnosis not present

## 2021-06-19 DIAGNOSIS — M6281 Muscle weakness (generalized): Secondary | ICD-10-CM | POA: Diagnosis not present

## 2021-06-19 DIAGNOSIS — N3941 Urge incontinence: Secondary | ICD-10-CM | POA: Diagnosis not present

## 2021-06-19 DIAGNOSIS — N3942 Incontinence without sensory awareness: Secondary | ICD-10-CM | POA: Diagnosis not present

## 2021-06-26 DIAGNOSIS — N3942 Incontinence without sensory awareness: Secondary | ICD-10-CM | POA: Diagnosis not present

## 2021-06-26 DIAGNOSIS — M6281 Muscle weakness (generalized): Secondary | ICD-10-CM | POA: Diagnosis not present

## 2021-06-26 DIAGNOSIS — N3941 Urge incontinence: Secondary | ICD-10-CM | POA: Diagnosis not present

## 2021-07-03 DIAGNOSIS — H401131 Primary open-angle glaucoma, bilateral, mild stage: Secondary | ICD-10-CM | POA: Diagnosis not present

## 2021-07-10 DIAGNOSIS — N3941 Urge incontinence: Secondary | ICD-10-CM | POA: Diagnosis not present

## 2021-07-10 DIAGNOSIS — M6281 Muscle weakness (generalized): Secondary | ICD-10-CM | POA: Diagnosis not present

## 2021-07-10 DIAGNOSIS — N3942 Incontinence without sensory awareness: Secondary | ICD-10-CM | POA: Diagnosis not present

## 2021-07-17 DIAGNOSIS — N3941 Urge incontinence: Secondary | ICD-10-CM | POA: Diagnosis not present

## 2021-07-17 DIAGNOSIS — M6281 Muscle weakness (generalized): Secondary | ICD-10-CM | POA: Diagnosis not present

## 2021-07-17 DIAGNOSIS — N3942 Incontinence without sensory awareness: Secondary | ICD-10-CM | POA: Diagnosis not present

## 2021-07-30 DIAGNOSIS — N3941 Urge incontinence: Secondary | ICD-10-CM | POA: Diagnosis not present

## 2021-07-30 DIAGNOSIS — M6281 Muscle weakness (generalized): Secondary | ICD-10-CM | POA: Diagnosis not present

## 2021-07-30 DIAGNOSIS — N3942 Incontinence without sensory awareness: Secondary | ICD-10-CM | POA: Diagnosis not present

## 2021-10-02 DIAGNOSIS — H02831 Dermatochalasis of right upper eyelid: Secondary | ICD-10-CM | POA: Diagnosis not present

## 2021-10-02 DIAGNOSIS — H02834 Dermatochalasis of left upper eyelid: Secondary | ICD-10-CM | POA: Diagnosis not present

## 2021-10-22 IMAGING — MR MR CERVICAL SPINE W/O CM
4 of 5 series · 27 of 48 positions shown · non-contrast
Comparison: Prior MRI from 12/23/2009.

CLINICAL DATA: Initial evaluation for neck and shoulder pain with
numbness and weakness in hands. History of prior cervical fusion.

EXAM:
MRI CERVICAL SPINE WITHOUT CONTRAST
TECHNIQUE: Multiplanar, multisequence MR imaging of the cervical spine was
performed. No intravenous contrast was administered.

[Series 5: T2 · sagittal · 3.0mm · 0.55mm/px · 7 of 15 slices shown (1 of 2)]
[im 1/15]
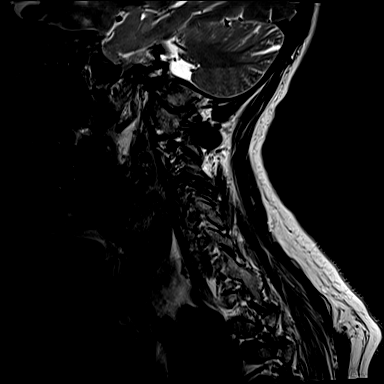
[im 3/15]
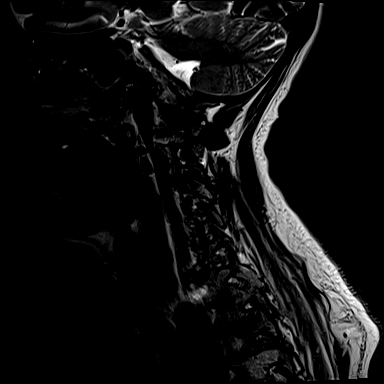
[im 5/15]
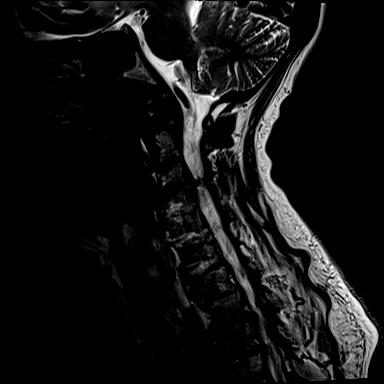
[im 8/15]
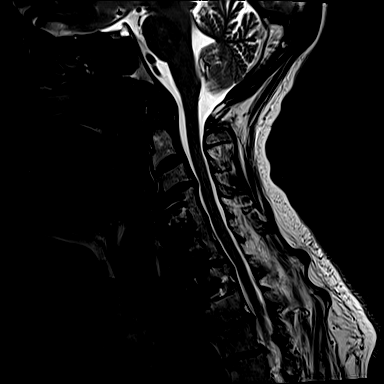
[im 10/15]
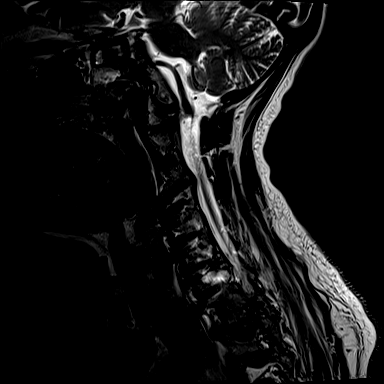
[im 12/15]
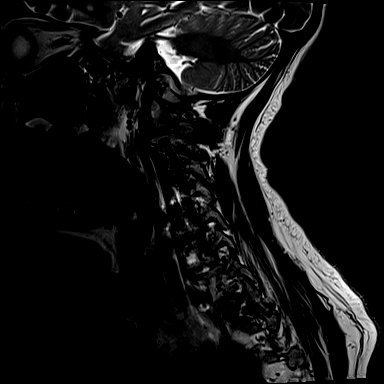
[im 15/15]
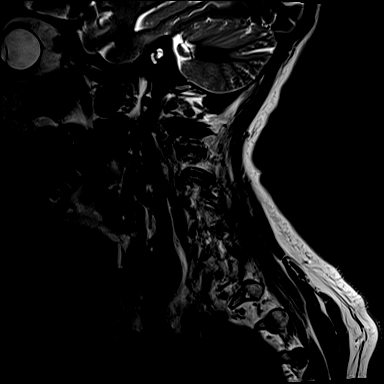

[Series 6: T1 · sagittal · 3.0mm · 0.66mm/px · 7 of 15 slices shown]
[im 1/15]
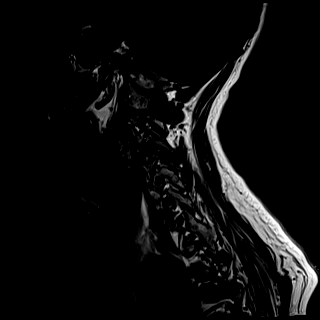
[im 3/15]
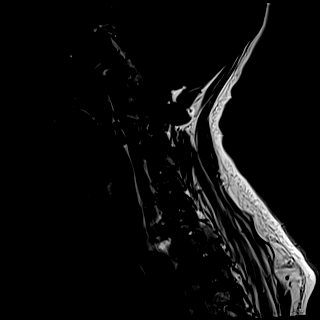
[im 5/15]
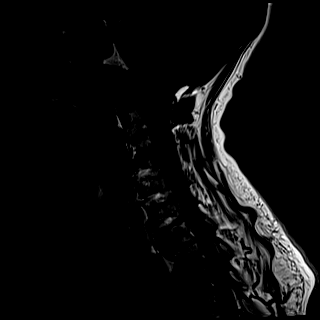
[im 8/15]
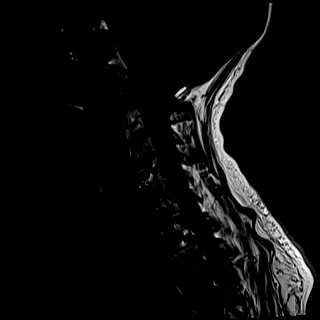
[im 10/15]
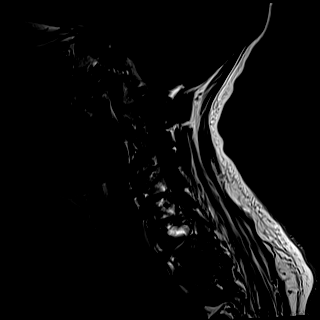
[im 12/15]
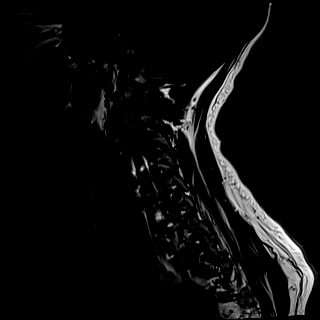
[im 15/15]
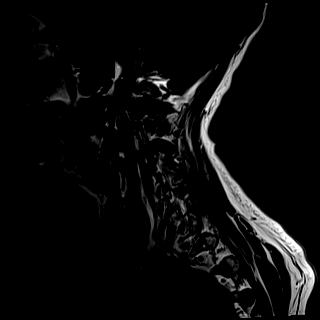

[Series 7: STIR · sagittal · 3.0mm · 0.33mm/px · 5 of 15 slices shown]
[im 1/15]
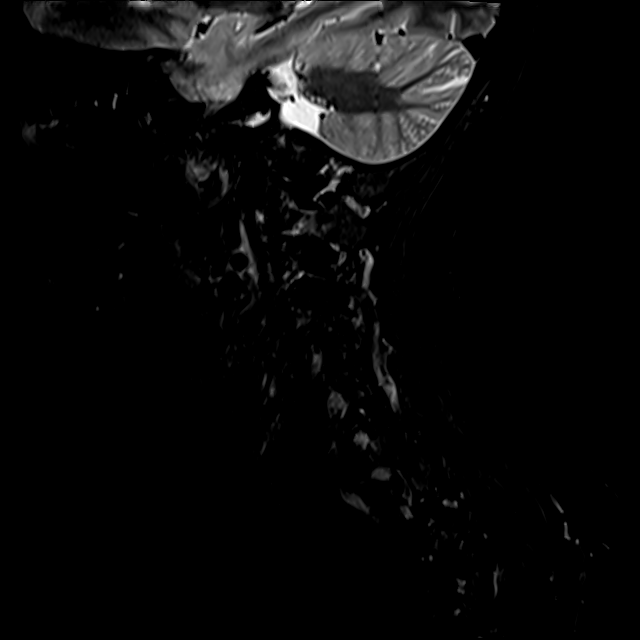
[im 3/15]
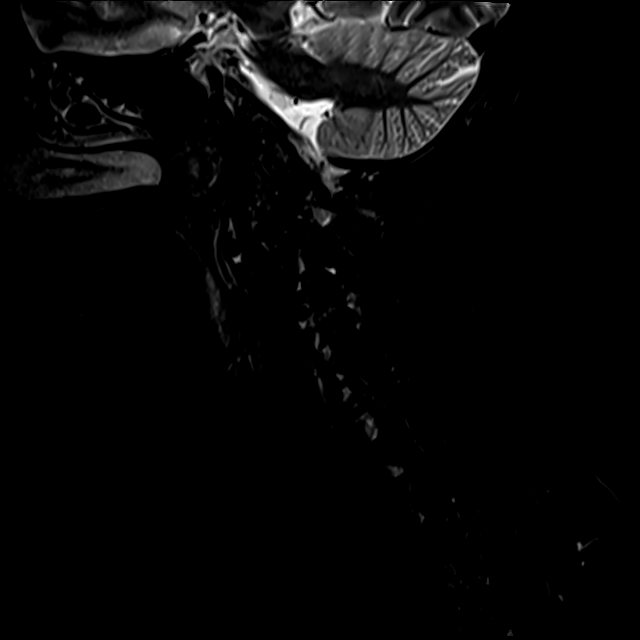
[im 6/15]
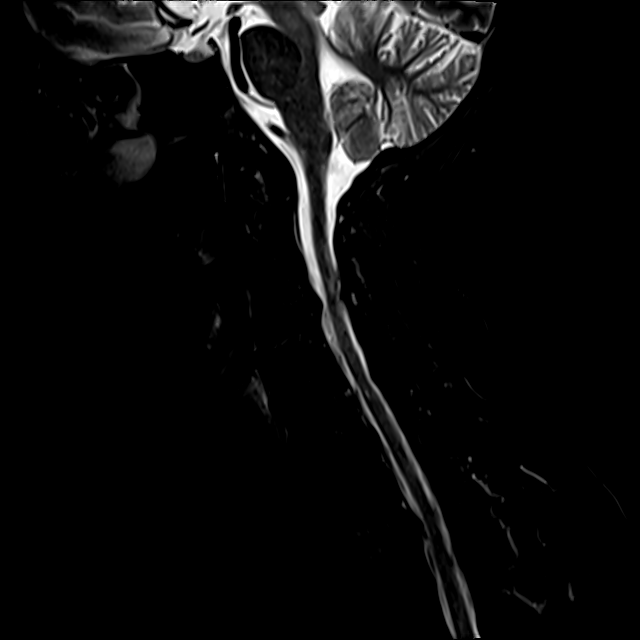
[im 9/15]
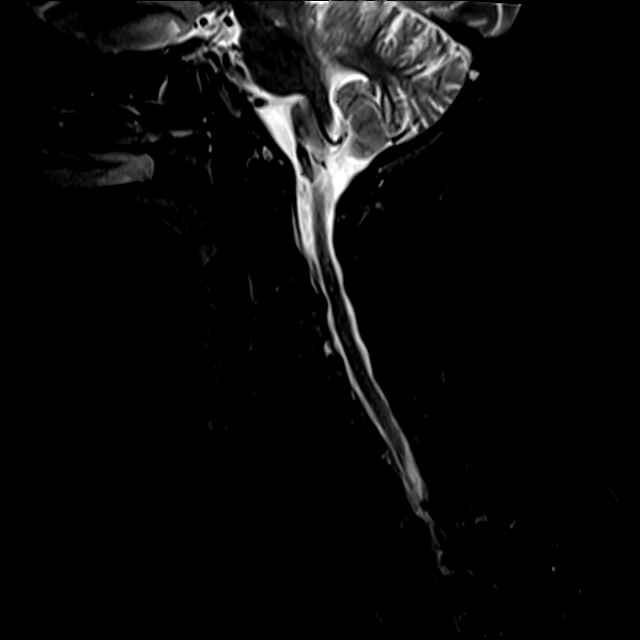
[im 15/15]
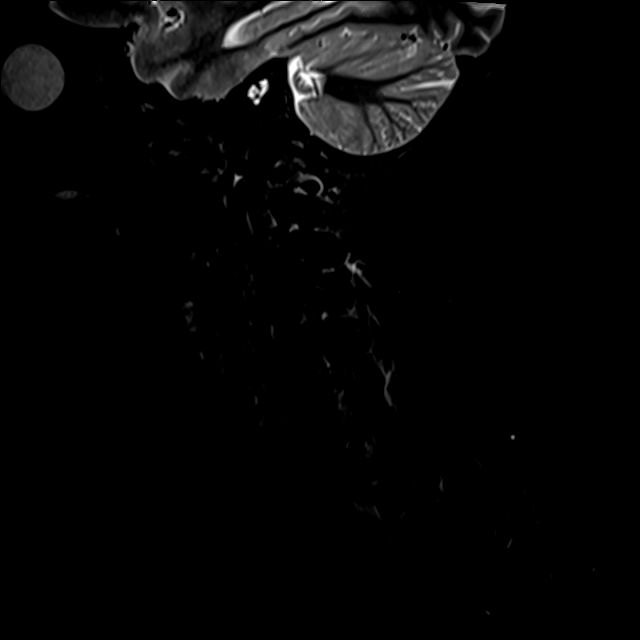

[Series 8: T2 · axial · 3.0mm · 0.50mm/px · z∈[-48,+56]mm · 8 of 34 slices shown (2 of 2)]
[im 1/34]
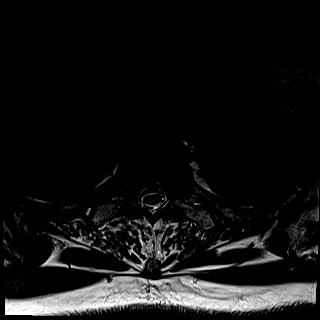
[im 6/34]
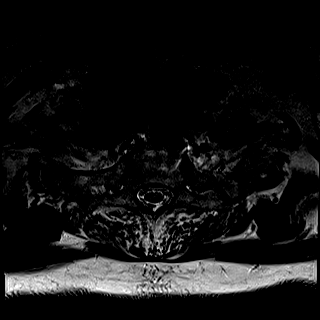
[im 11/34]
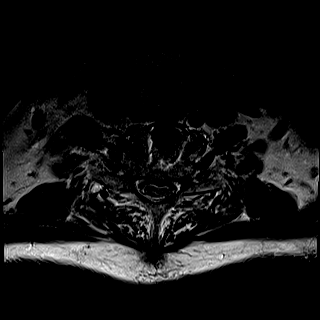
[im 16/34]
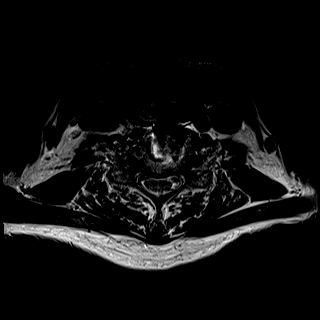
[im 18/34]
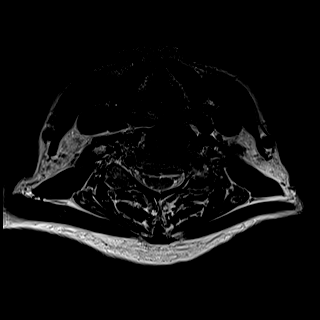
[im 23/34]
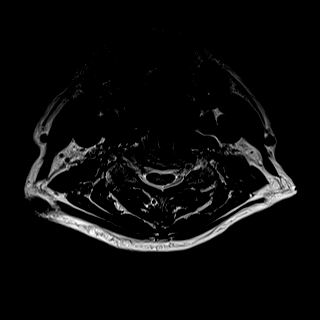
[im 28/34]
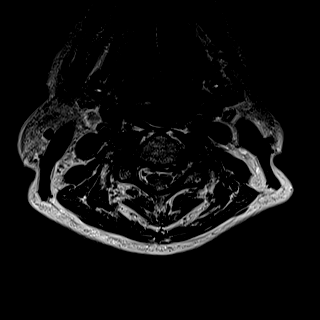
[im 34/34]
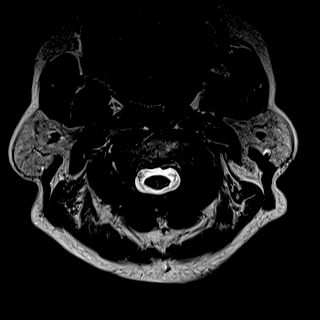

[27 of 48 positions shown; findings below may reference images not displayed]

FINDINGS: Alignment: Straightening of the normal cervical lordosis. Trace
degenerative anterolisthesis of C2 on C3.

Vertebrae: Susceptibility artifact related to prior ACDF seen at
C4-C7. Vertebral body height maintained without acute or chronic
fracture. Underlying bone marrow signal intensity within normal
limits. Few scattered benign hemangiomata noted. No worrisome
osseous lesions. No abnormal marrow edema.

Cord: No convincing cord signal abnormality identified, although
evaluation mildly limited by motion artifact.

Posterior Fossa, vertebral arteries, paraspinal tissues:
Subcentimeter cystic lesion at the posterior pituitary gland noted,
likely a small pars intermedius cyst. Visualized brain and posterior
fossa otherwise unremarkable. Craniocervical junction within normal
limits. Paraspinous and prevertebral soft tissues are normal. Normal
flow voids seen within the vertebral arteries bilaterally.

Disc levels:

C2-C3: Trace anterolisthesis without significant disc bulge. Severe
left with mild right facet degeneration. No spinal stenosis.
Moderate left C3 foraminal narrowing. No significant right foraminal
encroachment.

C3-C4: Degenerative intervertebral disc space narrowing with diffuse
degenerative disc osteophyte. Broad posterior component flattens and
effaces the ventral thecal sac with resultant mild spinal stenosis
and minimal flattening of the ventral spinal cord. Superimposed
bilateral facet hypertrophy. Resultant severe right with moderate
left C4 foraminal stenosis.

C4-C5:  Prior fusion.  No residual canal or foraminal stenosis.

C5-C6: Prior fusion. No significant residual canal or foraminal
stenosis.

C6-C7: Prior fusion. No significant residual canal or foraminal
stenosis.

C7-T1: Partial ankylosis of the C7 and T1 vertebral bodies, likely
degenerative. Right C7-T1 facet is fused as well. No significant
spinal stenosis. Mild to moderate bilateral C8 foraminal stenosis,
left slightly worse than right.

Visualized upper thoracic spine demonstrates multilevel spondylosis
without significant spinal stenosis. Moderate right with severe left
foraminal narrowing noted at T1-2, with moderate right-sided
foraminal narrowing at T2-3.
IMPRESSION: 1. Prior ACDF at C4-C7 without residual canal or foraminal stenosis.
2. Adjacent segment disease with degenerative disc osteophyte and
facet hypertrophy at C3-4 with resultant mild canal, with severe
right and moderate left C4 foraminal stenosis.
3. Severe left facet degeneration at C2-3 with resultant moderate
left C3 foraminal stenosis.
4. Multilevel upper thoracic spondylosis with resultant moderate to
severe bilateral foraminal stenosis at T1-2 and T2-3.

## 2021-11-14 DIAGNOSIS — E785 Hyperlipidemia, unspecified: Secondary | ICD-10-CM | POA: Diagnosis not present

## 2021-11-14 DIAGNOSIS — E039 Hypothyroidism, unspecified: Secondary | ICD-10-CM | POA: Diagnosis not present

## 2021-11-14 DIAGNOSIS — M8580 Other specified disorders of bone density and structure, unspecified site: Secondary | ICD-10-CM | POA: Diagnosis not present

## 2021-11-14 DIAGNOSIS — R739 Hyperglycemia, unspecified: Secondary | ICD-10-CM | POA: Diagnosis not present

## 2021-11-14 DIAGNOSIS — R269 Unspecified abnormalities of gait and mobility: Secondary | ICD-10-CM | POA: Diagnosis not present

## 2021-11-14 DIAGNOSIS — E559 Vitamin D deficiency, unspecified: Secondary | ICD-10-CM | POA: Diagnosis not present

## 2021-11-14 DIAGNOSIS — I10 Essential (primary) hypertension: Secondary | ICD-10-CM | POA: Diagnosis not present

## 2021-11-14 DIAGNOSIS — F4321 Adjustment disorder with depressed mood: Secondary | ICD-10-CM | POA: Diagnosis not present

## 2021-11-14 DIAGNOSIS — G629 Polyneuropathy, unspecified: Secondary | ICD-10-CM | POA: Diagnosis not present

## 2021-12-05 DIAGNOSIS — M5451 Vertebrogenic low back pain: Secondary | ICD-10-CM | POA: Diagnosis not present

## 2021-12-05 DIAGNOSIS — M6281 Muscle weakness (generalized): Secondary | ICD-10-CM | POA: Diagnosis not present

## 2021-12-10 DIAGNOSIS — M5451 Vertebrogenic low back pain: Secondary | ICD-10-CM | POA: Diagnosis not present

## 2021-12-10 DIAGNOSIS — M6281 Muscle weakness (generalized): Secondary | ICD-10-CM | POA: Diagnosis not present

## 2021-12-11 DIAGNOSIS — M6281 Muscle weakness (generalized): Secondary | ICD-10-CM | POA: Diagnosis not present

## 2021-12-11 DIAGNOSIS — M5451 Vertebrogenic low back pain: Secondary | ICD-10-CM | POA: Diagnosis not present

## 2021-12-18 DIAGNOSIS — M5451 Vertebrogenic low back pain: Secondary | ICD-10-CM | POA: Diagnosis not present

## 2021-12-18 DIAGNOSIS — M6281 Muscle weakness (generalized): Secondary | ICD-10-CM | POA: Diagnosis not present

## 2021-12-20 DIAGNOSIS — M5451 Vertebrogenic low back pain: Secondary | ICD-10-CM | POA: Diagnosis not present

## 2021-12-20 DIAGNOSIS — M6281 Muscle weakness (generalized): Secondary | ICD-10-CM | POA: Diagnosis not present

## 2021-12-24 DIAGNOSIS — M6281 Muscle weakness (generalized): Secondary | ICD-10-CM | POA: Diagnosis not present

## 2021-12-24 DIAGNOSIS — M5451 Vertebrogenic low back pain: Secondary | ICD-10-CM | POA: Diagnosis not present

## 2021-12-26 DIAGNOSIS — R531 Weakness: Secondary | ICD-10-CM | POA: Diagnosis not present

## 2021-12-26 DIAGNOSIS — R2681 Unsteadiness on feet: Secondary | ICD-10-CM | POA: Diagnosis not present

## 2021-12-26 DIAGNOSIS — R262 Difficulty in walking, not elsewhere classified: Secondary | ICD-10-CM | POA: Diagnosis not present

## 2021-12-30 DIAGNOSIS — M6281 Muscle weakness (generalized): Secondary | ICD-10-CM | POA: Diagnosis not present

## 2021-12-30 DIAGNOSIS — M5451 Vertebrogenic low back pain: Secondary | ICD-10-CM | POA: Diagnosis not present

## 2022-01-01 DIAGNOSIS — H401131 Primary open-angle glaucoma, bilateral, mild stage: Secondary | ICD-10-CM | POA: Diagnosis not present

## 2022-01-01 DIAGNOSIS — H5212 Myopia, left eye: Secondary | ICD-10-CM | POA: Diagnosis not present

## 2022-01-01 DIAGNOSIS — M5451 Vertebrogenic low back pain: Secondary | ICD-10-CM | POA: Diagnosis not present

## 2022-01-01 DIAGNOSIS — H353131 Nonexudative age-related macular degeneration, bilateral, early dry stage: Secondary | ICD-10-CM | POA: Diagnosis not present

## 2022-01-01 DIAGNOSIS — M6281 Muscle weakness (generalized): Secondary | ICD-10-CM | POA: Diagnosis not present

## 2022-01-01 DIAGNOSIS — Z961 Presence of intraocular lens: Secondary | ICD-10-CM | POA: Diagnosis not present

## 2022-01-08 DIAGNOSIS — M810 Age-related osteoporosis without current pathological fracture: Secondary | ICD-10-CM | POA: Diagnosis not present

## 2022-01-08 DIAGNOSIS — Z124 Encounter for screening for malignant neoplasm of cervix: Secondary | ICD-10-CM | POA: Diagnosis not present

## 2022-01-08 DIAGNOSIS — Z6824 Body mass index (BMI) 24.0-24.9, adult: Secondary | ICD-10-CM | POA: Diagnosis not present

## 2022-01-08 DIAGNOSIS — Z01419 Encounter for gynecological examination (general) (routine) without abnormal findings: Secondary | ICD-10-CM | POA: Diagnosis not present

## 2022-01-08 DIAGNOSIS — Z01411 Encounter for gynecological examination (general) (routine) with abnormal findings: Secondary | ICD-10-CM | POA: Diagnosis not present

## 2022-01-08 DIAGNOSIS — Z1231 Encounter for screening mammogram for malignant neoplasm of breast: Secondary | ICD-10-CM | POA: Diagnosis not present

## 2022-02-05 DIAGNOSIS — M6281 Muscle weakness (generalized): Secondary | ICD-10-CM | POA: Diagnosis not present

## 2022-02-05 DIAGNOSIS — M5451 Vertebrogenic low back pain: Secondary | ICD-10-CM | POA: Diagnosis not present

## 2022-02-11 DIAGNOSIS — M6281 Muscle weakness (generalized): Secondary | ICD-10-CM | POA: Diagnosis not present

## 2022-02-11 DIAGNOSIS — M5451 Vertebrogenic low back pain: Secondary | ICD-10-CM | POA: Diagnosis not present

## 2022-02-12 DIAGNOSIS — M5451 Vertebrogenic low back pain: Secondary | ICD-10-CM | POA: Diagnosis not present

## 2022-02-12 DIAGNOSIS — M6281 Muscle weakness (generalized): Secondary | ICD-10-CM | POA: Diagnosis not present

## 2022-02-19 DIAGNOSIS — M6281 Muscle weakness (generalized): Secondary | ICD-10-CM | POA: Diagnosis not present

## 2022-02-19 DIAGNOSIS — M5451 Vertebrogenic low back pain: Secondary | ICD-10-CM | POA: Diagnosis not present

## 2022-02-20 DIAGNOSIS — M6281 Muscle weakness (generalized): Secondary | ICD-10-CM | POA: Diagnosis not present

## 2022-02-20 DIAGNOSIS — M5451 Vertebrogenic low back pain: Secondary | ICD-10-CM | POA: Diagnosis not present

## 2022-02-25 DIAGNOSIS — M5451 Vertebrogenic low back pain: Secondary | ICD-10-CM | POA: Diagnosis not present

## 2022-02-25 DIAGNOSIS — M6281 Muscle weakness (generalized): Secondary | ICD-10-CM | POA: Diagnosis not present

## 2022-02-28 DIAGNOSIS — M6281 Muscle weakness (generalized): Secondary | ICD-10-CM | POA: Diagnosis not present

## 2022-02-28 DIAGNOSIS — M5451 Vertebrogenic low back pain: Secondary | ICD-10-CM | POA: Diagnosis not present

## 2022-03-04 DIAGNOSIS — M6281 Muscle weakness (generalized): Secondary | ICD-10-CM | POA: Diagnosis not present

## 2022-03-04 DIAGNOSIS — M5451 Vertebrogenic low back pain: Secondary | ICD-10-CM | POA: Diagnosis not present

## 2022-03-18 DIAGNOSIS — H353132 Nonexudative age-related macular degeneration, bilateral, intermediate dry stage: Secondary | ICD-10-CM | POA: Diagnosis not present

## 2022-03-18 DIAGNOSIS — H52203 Unspecified astigmatism, bilateral: Secondary | ICD-10-CM | POA: Diagnosis not present

## 2022-03-18 DIAGNOSIS — Z961 Presence of intraocular lens: Secondary | ICD-10-CM | POA: Diagnosis not present

## 2022-03-18 DIAGNOSIS — H401131 Primary open-angle glaucoma, bilateral, mild stage: Secondary | ICD-10-CM | POA: Diagnosis not present

## 2022-03-31 DIAGNOSIS — R42 Dizziness and giddiness: Secondary | ICD-10-CM | POA: Diagnosis not present

## 2022-03-31 DIAGNOSIS — E538 Deficiency of other specified B group vitamins: Secondary | ICD-10-CM | POA: Diagnosis not present

## 2022-03-31 DIAGNOSIS — I10 Essential (primary) hypertension: Secondary | ICD-10-CM | POA: Diagnosis not present

## 2022-03-31 DIAGNOSIS — D472 Monoclonal gammopathy: Secondary | ICD-10-CM | POA: Diagnosis not present

## 2022-03-31 DIAGNOSIS — E559 Vitamin D deficiency, unspecified: Secondary | ICD-10-CM | POA: Diagnosis not present

## 2022-03-31 DIAGNOSIS — E039 Hypothyroidism, unspecified: Secondary | ICD-10-CM | POA: Diagnosis not present

## 2022-03-31 DIAGNOSIS — G629 Polyneuropathy, unspecified: Secondary | ICD-10-CM | POA: Diagnosis not present

## 2022-03-31 DIAGNOSIS — R5383 Other fatigue: Secondary | ICD-10-CM | POA: Diagnosis not present

## 2022-04-30 DIAGNOSIS — L719 Rosacea, unspecified: Secondary | ICD-10-CM | POA: Diagnosis not present

## 2022-04-30 DIAGNOSIS — L570 Actinic keratosis: Secondary | ICD-10-CM | POA: Diagnosis not present

## 2022-04-30 DIAGNOSIS — L219 Seborrheic dermatitis, unspecified: Secondary | ICD-10-CM | POA: Diagnosis not present

## 2022-05-09 DIAGNOSIS — R202 Paresthesia of skin: Secondary | ICD-10-CM | POA: Diagnosis not present

## 2022-05-09 DIAGNOSIS — E039 Hypothyroidism, unspecified: Secondary | ICD-10-CM | POA: Diagnosis not present

## 2022-05-09 DIAGNOSIS — R5383 Other fatigue: Secondary | ICD-10-CM | POA: Diagnosis not present

## 2022-05-09 DIAGNOSIS — R7989 Other specified abnormal findings of blood chemistry: Secondary | ICD-10-CM | POA: Diagnosis not present

## 2022-05-09 DIAGNOSIS — E785 Hyperlipidemia, unspecified: Secondary | ICD-10-CM | POA: Diagnosis not present

## 2022-05-09 DIAGNOSIS — E539 Vitamin B deficiency, unspecified: Secondary | ICD-10-CM | POA: Diagnosis not present

## 2022-05-09 DIAGNOSIS — E559 Vitamin D deficiency, unspecified: Secondary | ICD-10-CM | POA: Diagnosis not present

## 2022-05-09 DIAGNOSIS — I10 Essential (primary) hypertension: Secondary | ICD-10-CM | POA: Diagnosis not present

## 2022-05-20 DIAGNOSIS — I10 Essential (primary) hypertension: Secondary | ICD-10-CM | POA: Diagnosis not present

## 2022-05-20 DIAGNOSIS — F4321 Adjustment disorder with depressed mood: Secondary | ICD-10-CM | POA: Diagnosis not present

## 2022-05-20 DIAGNOSIS — Z1389 Encounter for screening for other disorder: Secondary | ICD-10-CM | POA: Diagnosis not present

## 2022-05-20 DIAGNOSIS — E039 Hypothyroidism, unspecified: Secondary | ICD-10-CM | POA: Diagnosis not present

## 2022-05-20 DIAGNOSIS — M8580 Other specified disorders of bone density and structure, unspecified site: Secondary | ICD-10-CM | POA: Diagnosis not present

## 2022-05-20 DIAGNOSIS — R269 Unspecified abnormalities of gait and mobility: Secondary | ICD-10-CM | POA: Diagnosis not present

## 2022-05-20 DIAGNOSIS — R82998 Other abnormal findings in urine: Secondary | ICD-10-CM | POA: Diagnosis not present

## 2022-05-20 DIAGNOSIS — R739 Hyperglycemia, unspecified: Secondary | ICD-10-CM | POA: Diagnosis not present

## 2022-05-20 DIAGNOSIS — Z0000 Encounter for general adult medical examination without abnormal findings: Secondary | ICD-10-CM | POA: Diagnosis not present

## 2022-05-20 DIAGNOSIS — E785 Hyperlipidemia, unspecified: Secondary | ICD-10-CM | POA: Diagnosis not present

## 2022-05-20 DIAGNOSIS — G629 Polyneuropathy, unspecified: Secondary | ICD-10-CM | POA: Diagnosis not present

## 2022-05-20 DIAGNOSIS — Z1331 Encounter for screening for depression: Secondary | ICD-10-CM | POA: Diagnosis not present

## 2022-07-02 DIAGNOSIS — M810 Age-related osteoporosis without current pathological fracture: Secondary | ICD-10-CM | POA: Diagnosis not present

## 2022-07-02 LAB — HM DEXA SCAN

## 2022-07-11 ENCOUNTER — Encounter: Payer: Self-pay | Admitting: Gastroenterology

## 2022-07-11 ENCOUNTER — Ambulatory Visit: Payer: Medicare PPO | Admitting: Gastroenterology

## 2022-07-11 VITALS — BP 124/60 | HR 73 | Ht 60.5 in | Wt 128.0 lb

## 2022-07-11 DIAGNOSIS — R194 Change in bowel habit: Secondary | ICD-10-CM

## 2022-07-11 DIAGNOSIS — Z86010 Personal history of colonic polyps: Secondary | ICD-10-CM

## 2022-07-11 DIAGNOSIS — R12 Heartburn: Secondary | ICD-10-CM

## 2022-07-11 DIAGNOSIS — R1310 Dysphagia, unspecified: Secondary | ICD-10-CM

## 2022-07-11 NOTE — Patient Instructions (Signed)
GERD Lifestyle modifications: -Eat meals >3 hours before bedtime -Do not lay down or lie flat immediately after eating for at least 2-hours -Do not overeat (decrease portion size and/or eat more frequent smaller meals) -Eat slowly -Wear Loose-fitting clothes -Raise Head of Bed (Head & Chest > Feet with bed blocks) -Avoid foods that trigger the symptoms (Onions, Chocolate, Caffeine, Spicy, and Fatty) -Work on Losing [employer] -Stop Tobacco Use -Avoid Alcohol -Increase Exercise Activity -Maintain Heartburn Diary/Log  Start Fiber Con OR Benefiber ( Take 1-2 times daily.)   You have been scheduled for an endoscopy. Please follow written instructions given to you at your visit today. If you use inhalers (even only as needed), please bring them with you on the day of your procedure. _______________________________________________________  If you are age 86 or older, your body mass index should be between 23-30. Your Body mass index is 24.59 kg/m. If this is out of the aforementioned range listed, please consider follow up with your Primary Care Provider.  If you are age 95 or younger, your body mass index should be between 19-25. Your Body mass index is 24.59 kg/m. If this is out of the aformentioned range listed, please consider follow up with your Primary Care Provider.   ________________________________________________________  The [facility] providers would like to encourage you to use [facility] to communicate with providers for non-urgent requests or questions.  Due to long hold times on the telephone, sending your provider a message by [facility] may be a faster and more efficient way to get a response.  Please allow 48 business hours for a response.  Please remember that this is for non-urgent requests.  _______________________________________________________    Thank you for choosing me and [facility].  Dr. Rush Landmark

## 2022-07-11 NOTE — Progress Notes (Signed)
[facility] VISIT   Primary Care Provider Shon Baton, MD [address] [place] [address] [phone]  Referring Provider Shon Baton, MD [address] [place],  [place] [address] [phone]  Patient Profile: Carol Woods is a 88 y.o. female with a pmh significant for hypertension, hyperlipidemia, hypothyroidism, MDD, anxiety, glaucoma, GERD.  The patient presents to the [facility] for an evaluation and management of problem(s) noted below:  Problem List 1. Pill dysphagia   2. Pyrosis   3. Change in bowel habits   4. History of colonic polyps     History of Present Illness This is the patient's first visit to the outpatient [place] clinic.  She previously was followed by Dr. Earlean Shawl years ago for colonoscopy in her late 70s being her last 1 a history of prior polyps.  Patient is referred for further evaluation in the setting of issues with pill dysphagia as well as some changes in her bowel habits.  Patient has 2 take pills or capsules very slowly and 1 at a time.  Approximately once a week she will get a sensation of a pill or capsule getting stuck and subsequently having to either regurgitated or at times she has had shortness of breath develop.  She states that she has an irritated throat at times but not every day.  She has had 1 episode of liquid dysphagia but denies any solid food dysphagia.  She takes Fosamax but has never had a sensation of significant discomfort or pain after the utilization of that medicine.  She has had GERD symptoms on and off and previously was recommended a PPI but she has done lifestyle modifications by eating earlier in the evening and decreasing her food intake and this has been helpful.  She has used Tums recently to help with GERD symptoms.  She has experienced issues of increased eructation at times but not always.  There is at times issues of bloating.  She has had diarrhea for years and in the  past had been on Metamucil to try and bulk her stools but she developed a significant mount of bloating and so that stopped.  Her weight has been stable overall without any notice of blood in her stools.  There is no GI malignancies noted in her family.  She has never had an upper endoscopy.  GI Review of Systems Positive as above Negative for odynophagia, nausea, vomiting, melena, hematochezia  Review of Systems General: Denies fevers/chills/weight loss unintentionally HEENT: Denies oral lesions Cardiovascular: Denies chest pain Pulmonary: Denies current shortness of breath Gastroenterological: See HPI Genitourinary: Denies darkened urine Hematological: Denies easy bruising/bleeding Endocrine: Denies temperature intolerance Dermatological: Denies jaundice Psychological: Mood is stable at this time   Medications Current Outpatient Medications  Medication Sig Dispense Refill   alendronate (FOSAMAX) 70 MG tablet Take 1 tablet po q weekly as directed Oral     b complex vitamins capsule Take 1 capsule by mouth daily.     CALCIUM PO Take 1 tablet by mouth daily.     Cholecalciferol (VITAMIN D PO) Take 1 tablet by mouth daily.     clindamycin (CLEOCIN T) 1 % external solution Apply 1 application topically daily.      cyanocobalamin 1000 MCG tablet Take 1,000 mcg by mouth daily.     levothyroxine (SYNTHROID, LEVOTHROID) 112 MCG tablet Take 112 mcg by mouth daily before breakfast.     lisinopril (PRINIVIL,ZESTRIL) 20 MG tablet      Omega-3 Fatty Acids (FISH  OIL PO) Take 1 capsule by mouth daily.     amLODipine (NORVASC) 2.5 MG tablet TAKE 3 TABLETS IN THE EVENING FOR BLOOD PRESSURE. (Patient taking differently: TAKE 2 TABLETS IN THE EVENING FOR BLOOD PRESSURE.) 270 tablet 2   No current facility-administered medications for this visit.    Allergies No Known Allergies  Histories Past Medical History:  Diagnosis Date   Arthritis    "back; shoulders" (08/31/2014)   Atypical chest  pain 11/21/2019   Cataracts, both eyes    Essential hypertension 07/13/2018   GERD (gastroesophageal reflux disease)    OTC, changed diet   High cholesterol    Hypertension    Hypothyroidism    Past Surgical History:  Procedure Laterality Date   ANTERIOR CERVICAL DECOMP/DISCECTOMY FUSION  02/2010   Dr Ellene Route   BACK SURGERY     COLONOSCOPY W/ POLYPECTOMY     DILATION AND CURETTAGE OF UTERUS     POSTERIOR LUMBAR FUSION  04/2010   Dr Ellene Route   TONSILLECTOMY     age 73   TOTAL SHOULDER ARTHROPLASTY Right 08/31/2014   TOTAL SHOULDER ARTHROPLASTY Right 08/31/2014   Procedure: TOTAL SHOULDER ARTHROPLASTY;  Surgeon: Nita Sells, MD;  Location: [facility];  Service: Orthopedics;  Laterality: Right;  Right total shoulder arthroplasty   Social History   Socioeconomic History   Marital status: Divorced    Spouse name: Not on file   Number of children: Not on file   Years of education: Not on file   Highest education level: Not on file  Occupational History   Not on file  Tobacco Use   Smoking status: Former    Packs/day: 1.50    Years: 25.00    Total pack years: 37.50    Types: Cigarettes    Quit date: 08/22/1984    Years since quitting: 37.9   Smokeless tobacco: Never  Vaping Use   Vaping Use: Never used  Substance and Sexual Activity   Alcohol use: Yes    Alcohol/week: 12.0 standard drinks of alcohol    Types: 12 Glasses of wine per week   Drug use: No   Sexual activity: Not on file  Other Topics Concern   Not on file  Social History Narrative   Not on file   Social Determinants of Health   Financial Resource Strain: Not on file  Food Insecurity: Not on file  Transportation Needs: Not on file  Physical Activity: Not on file  Stress: Not on file  Social Connections: Not on file  Intimate Partner Violence: Not on file   Family History  Problem Relation Age of Onset   Breast cancer Mother    Ovarian cancer Mother    Alzheimer's disease Mother    Parkinson's  disease Father    Heart disease Father    Hypothyroidism Father    Hypertension Sister    Breast cancer Sister    Kidney disease Sister    Prostate cancer Brother    Heart attack Cousin    Colon cancer Neg Hx    Esophageal cancer Neg Hx    Inflammatory bowel disease Neg Hx    Liver disease Neg Hx    Pancreatic cancer Neg Hx    Rectal cancer Neg Hx    Stomach cancer Neg Hx    I have reviewed her medical, social, and family history in detail and updated the electronic medical record as necessary.    PHYSICAL EXAMINATION  BP 124/60   Pulse 73  Ht 5' 0.5" (1.537 m)   Wt 128 lb (58.1 kg)   BMI 24.59 kg/m  Wt Readings from Last 3 Encounters:  07/11/22 128 lb (58.1 kg)  11/21/19 130 lb (59 kg)  10/12/18 131 lb 9.6 oz (59.7 kg)  GEN: NAD, appears younger than stated age, doesn't appear chronically ill PSYCH: Cooperative, without pressured speech EYE: Conjunctivae pink, sclerae anicteric ENT: MMM CV: RR without R/Gs  RESP: CTAB posteriorly, without wheezing GI: NABS, soft, NT/ND, without rebound or guarding MSK/EXT: No significant lower extremity edema SKIN: No jaundice NEURO:  Alert & Oriented x 3, no focal deficits   REVIEW OF DATA  I reviewed the following data at the time of this encounter:  GI Procedures and Studies  Reports previous colonoscopy in her 22s but we do not have access to that report and her prior provider has retired  Production assistant, radio Studies  Reviewed those in epic  Imaging Studies  No relevant studies to review   ASSESSMENT  Carol Woods is a 88 y.o. female with a pmh significant for hypertension, hyperlipidemia, hypothyroidism, MDD, anxiety, glaucoma, GERD.  The patient is seen today for evaluation and management of:  1. Pill dysphagia   2. Pyrosis   3. Change in bowel habits   4. History of colonic polyps    The patient is hemodynamically stable at this time.  She has had symptoms of pyrosis for a while and has developed some pill dysphagia  issues.  Thankfully she is only have 1 episode of liquid dysphagia no overt solid food dysphagia.  I think it is reasonable to consider a diagnostic upper endoscopy with possible therapeutic intentions and dilation empirically versus if necessary to rule out structural abnormalities.  She could have a Zenker's diverticulum as well but should be considered versus a dysmotility.  We went over GERD lifestyle modifications as well.  We discussed potential barium swallow though many signs would point that she would end up needing an upper endoscopy so it is reasonable to just go ahead and begin with that.  We would rule out eosinophilic/lymphocytic esophagitis as well.  In regards to her alteration of bowel habits, she has had longer standing issues for years.  She is not interested in a colonoscopy at this point if possible and as she has had in the ability to manage some of her symptoms previously when she had been on bulking fiber I recommend that we reinitiate bulking fiber at this time.  We will trial Benefiber or FiberCon.  If patient continues to have issues, then may need to consider a diagnostic colonoscopy to rule out lymphocytic colitis at some point in the future hopefully that will not be the case.  The risks and benefits of endoscopic evaluation were discussed with the patient; these include but are not limited to the risk of perforation, infection, bleeding, missed lesions, lack of diagnosis, severe illness requiring hospitalization, as well as anesthesia and sedation related illnesses.  The patient and/or family is agreeable to proceed.  All patient questions were answered to the best of my ability, and the patient agrees to the aforementioned plan of action with follow-up as indicated.   PLAN  Diagnostic endoscopy due to patient's age and new onset symptoms of dysphagia with potential therapeutic intent and dilation likely May consider Pepcid versus PPI in future GERD lifestyle modifications as  outlined in patient instructions Initiate Benefiber or FiberCon daily and increase to twice daily as able Holding on diagnostic colonoscopy or further diagnostic work-up from  a diarrhea standpoint unless things progress or do not improve with bulking   Orders Placed This Encounter  Procedures   Ambulatory referral to Gastroenterology    New Prescriptions   No medications on file   Modified Medications   No medications on file    Planned Follow Up No follow-ups on file.   Total Time in Face-to-Face and in Coordination of Care for patient including independent/personal interpretation/review of prior testing, medical history, examination, medication adjustment, communicating results with the patient directly, and documentation within the EHR is 45 minutes.   Justice Britain, MD [facility] Advanced Endoscopy Office # [phone]

## 2022-07-15 DIAGNOSIS — H401132 Primary open-angle glaucoma, bilateral, moderate stage: Secondary | ICD-10-CM | POA: Diagnosis not present

## 2022-07-30 ENCOUNTER — Encounter: Payer: Self-pay | Admitting: Gastroenterology

## 2022-07-30 ENCOUNTER — Ambulatory Visit (AMBULATORY_SURGERY_CENTER): Payer: Medicare PPO | Admitting: Gastroenterology

## 2022-07-30 VITALS — BP 157/73 | HR 67 | Temp 98.9°F | Resp 10 | Ht 60.5 in | Wt 128.0 lb

## 2022-07-30 DIAGNOSIS — K219 Gastro-esophageal reflux disease without esophagitis: Secondary | ICD-10-CM | POA: Diagnosis not present

## 2022-07-30 DIAGNOSIS — R1310 Dysphagia, unspecified: Secondary | ICD-10-CM | POA: Diagnosis not present

## 2022-07-30 DIAGNOSIS — K2270 Barrett's esophagus without dysplasia: Secondary | ICD-10-CM | POA: Diagnosis not present

## 2022-07-30 DIAGNOSIS — K319 Disease of stomach and duodenum, unspecified: Secondary | ICD-10-CM | POA: Diagnosis not present

## 2022-07-30 DIAGNOSIS — K449 Diaphragmatic hernia without obstruction or gangrene: Secondary | ICD-10-CM

## 2022-07-30 DIAGNOSIS — I10 Essential (primary) hypertension: Secondary | ICD-10-CM | POA: Diagnosis not present

## 2022-07-30 DIAGNOSIS — K2900 Acute gastritis without bleeding: Secondary | ICD-10-CM

## 2022-07-30 DIAGNOSIS — E039 Hypothyroidism, unspecified: Secondary | ICD-10-CM | POA: Diagnosis not present

## 2022-07-30 MED ORDER — SODIUM CHLORIDE 0.9 % IV SOLN: 500.0000 mL | Freq: Once | INTRAVENOUS | Status: DC

## 2022-07-30 NOTE — Progress Notes (Signed)
VS by DT  Pt's states no medical or surgical changes since previsit or office visit.  

## 2022-07-30 NOTE — Op Note (Signed)
[facility] Patient Name: Carol Woods Procedure Date: 07/30/2022 9:08 AM MRN: [id number] Endoscopist: Justice Britain , MD, [id number] Age: 88 Referring MD:  Date of Birth: 05-02-1934 Gender: Female Account #: [id number] Procedure:                Upper GI endoscopy Indications:              Dysphagia, Heartburn Medicines:                Monitored Anesthesia Care Procedure:                Pre-Anesthesia Assessment:                           - Prior to the procedure, a History and Physical                            was performed, and patient medications and                            allergies were reviewed. The patient's tolerance of                            previous anesthesia was also reviewed. The risks                            and benefits of the procedure and the sedation                            options and risks were discussed with the patient.                            All questions were answered, and informed consent                            was obtained. Prior Anticoagulants: The patient has                            taken no anticoagulant or antiplatelet agents. ASA                            Grade Assessment: III - A patient with severe                            systemic disease. After reviewing the risks and                            benefits, the patient was deemed in satisfactory                            condition to undergo the procedure.                           After obtaining informed consent, the endoscope was  passed under direct vision. Throughout the                            procedure, the patient's blood pressure, pulse, and                            oxygen saturations were monitored continuously. The                            Endoscope was introduced through the mouth, and                            advanced to the second part of duodenum. The upper                            GI endoscopy was  accomplished without difficulty.                            The patient tolerated the procedure. Scope In: Scope Out: Findings:                 No gross mucosal lesions were noted in the majority                            of the esophagus. Biopsies were taken with a cold                            forceps for histology to rule out EoE/LoE.                           One tongue of salmon-colored mucosa was present                            from 36 to 37 cm (very distal esophagus). No other                            visible abnormalities were present. Biopsies were                            taken with a cold forceps for histology to rule                            in/out Barrett's.                           The Z-line was irregular and was found 37 cm from                            the incisors.                           After the rest of the EGD was completed, a  guidewire was placed and the scope was withdrawn.                            Dilation was performed in the esophagus with a                            Savary dilator with no resistance at 18 mm. The                            dilation site was examined following endoscope                            reinsertion and showed no change.                           A 3 cm hiatal hernia was present.                           Patchy mild inflammation characterized by erosions,                            erythema and granularity was found in the entire                            examined stomach. Biopsies were taken with a cold                            forceps for histology and Helicobacter pylori                            testing.                           No gross lesions were noted in the duodenal bulb,                            in the first portion of the duodenum and in the                            second portion of the duodenum. Complications:            No immediate complications. Estimated Blood  Loss:     Estimated blood loss was minimal. Impression:               - No gross mucosal lesions in the majority of the                            esophagus. Biopsied.                           - Salmon-colored mucosal tongue suspicious for                            short-segment Barrett's esophagus in very distal  esophagus. Biopsied.                           - Z-line irregular, 37 cm from the incisors.                           - Dilation performed in the esophagus.                           - 3 cm hiatal hernia.                           - Gastritis. Biopsied.                           - No gross lesions in the duodenal bulb, in the                            first portion of the duodenum and in the second                            portion of the duodenum. Recommendation:           - The patient will be observed post-procedure,                            until all discharge criteria are met.                           - Discharge patient to home.                           - Patient has a contact number available for                            emergencies. The signs and symptoms of potential                            delayed complications were discussed with the                            patient. Return to normal activities tomorrow.                            Written discharge instructions were provided to the                            patient.                           - Dilation diet as per protocol.                           - Continue present medications.                           - Await pathology  results.                           - Repeat upper endoscopy for surveillance based on                            pathology results.                           - If dysphagia symptoms persist, then Esophageal                            manometry will be recommended.                           - The findings and recommendations were discussed                             with the patient.                           - The findings and recommendations were discussed                            with the patient's family. Justice Britain, MD 07/30/2022 10:08:16 AM

## 2022-07-30 NOTE — Progress Notes (Signed)
Report to PACU, RN, vss, BBS= Clear.  

## 2022-07-30 NOTE — Progress Notes (Signed)
GASTROENTEROLOGY PROCEDURE H&P NOTE   Primary Care Physician: Shon Baton, MD  HPI: Carol Woods is a 88 y.o. female who presents for EGD for evaluation of pill dysphagia and pyrosis with possible dilation.  Past Medical History:  Diagnosis Date   Arthritis    "back; shoulders" (08/31/2014)   Atypical chest pain 11/21/2019   Cataracts, both eyes    Essential hypertension 07/13/2018   GERD (gastroesophageal reflux disease)    OTC, changed diet   High cholesterol    Hypertension    Hypothyroidism    Past Surgical History:  Procedure Laterality Date   ANTERIOR CERVICAL DECOMP/DISCECTOMY FUSION  02/2010   Dr Ellene Route   BACK SURGERY     COLONOSCOPY W/ POLYPECTOMY     DILATION AND CURETTAGE OF UTERUS     POSTERIOR LUMBAR FUSION  04/2010   Dr Ellene Route   TONSILLECTOMY     age 45   TOTAL SHOULDER ARTHROPLASTY Right 08/31/2014   TOTAL SHOULDER ARTHROPLASTY Right 08/31/2014   Procedure: TOTAL SHOULDER ARTHROPLASTY;  Surgeon: Nita Sells, MD;  Location: [facility];  Service: Orthopedics;  Laterality: Right;  Right total shoulder arthroplasty   Current Outpatient Medications  Medication Sig Dispense Refill   alendronate (FOSAMAX) 70 MG tablet Take 1 tablet po q weekly as directed Oral     amLODipine (NORVASC) 2.5 MG tablet TAKE 3 TABLETS IN THE EVENING FOR BLOOD PRESSURE. (Patient taking differently: TAKE 2 TABLETS IN THE EVENING FOR BLOOD PRESSURE.) 270 tablet 2   b complex vitamins capsule Take 1 capsule by mouth daily.     CALCIUM PO Take 1 tablet by mouth daily.     Cholecalciferol (VITAMIN D PO) Take 1 tablet by mouth daily.     clindamycin (CLEOCIN T) 1 % external solution Apply 1 application topically daily.      cyanocobalamin 1000 MCG tablet Take 1,000 mcg by mouth daily.     levothyroxine (SYNTHROID, LEVOTHROID) 112 MCG tablet Take 112 mcg by mouth daily before breakfast.     lisinopril (PRINIVIL,ZESTRIL) 20 MG tablet      Omega-3 Fatty Acids (FISH OIL PO) Take 1  capsule by mouth daily.     Current Facility-Administered Medications  Medication Dose Route Frequency Provider Last Rate Last Admin   0.9 %  sodium chloride infusion  500 mL Intravenous Once Mansouraty, Telford Nab., MD        Current Outpatient Medications:    alendronate (FOSAMAX) 70 MG tablet, Take 1 tablet po q weekly as directed Oral, Disp: , Rfl:    amLODipine (NORVASC) 2.5 MG tablet, TAKE 3 TABLETS IN THE EVENING FOR BLOOD PRESSURE. (Patient taking differently: TAKE 2 TABLETS IN THE EVENING FOR BLOOD PRESSURE.), Disp: 270 tablet, Rfl: 2   b complex vitamins capsule, Take 1 capsule by mouth daily., Disp: , Rfl:    CALCIUM PO, Take 1 tablet by mouth daily., Disp: , Rfl:    Cholecalciferol (VITAMIN D PO), Take 1 tablet by mouth daily., Disp: , Rfl:    clindamycin (CLEOCIN T) 1 % external solution, Apply 1 application topically daily. , Disp: , Rfl:    cyanocobalamin 1000 MCG tablet, Take 1,000 mcg by mouth daily., Disp: , Rfl:    levothyroxine (SYNTHROID, LEVOTHROID) 112 MCG tablet, Take 112 mcg by mouth daily before breakfast., Disp: , Rfl:    lisinopril (PRINIVIL,ZESTRIL) 20 MG tablet, , Disp: , Rfl:    Omega-3 Fatty Acids (FISH OIL PO), Take 1 capsule by mouth daily., Disp: , Rfl:  Current Facility-Administered Medications:    0.9 %  sodium chloride infusion, 500 mL, Intravenous, Once, Mansouraty, Telford Nab., MD No Known Allergies Family History  Problem Relation Age of Onset   Breast cancer Mother    Ovarian cancer Mother    Alzheimer's disease Mother    Parkinson's disease Father    Heart disease Father    Hypothyroidism Father    Hypertension Sister    Breast cancer Sister    Kidney disease Sister    Prostate cancer Brother    Heart attack Cousin    Colon cancer Neg Hx    Esophageal cancer Neg Hx    Inflammatory bowel disease Neg Hx    Liver disease Neg Hx    Pancreatic cancer Neg Hx    Rectal cancer Neg Hx    Stomach cancer Neg Hx    Social History    Socioeconomic History   Marital status: Divorced    Spouse name: Not on file   Number of children: Not on file   Years of education: Not on file   Highest education level: Not on file  Occupational History   Not on file  Tobacco Use   Smoking status: Former    Packs/day: 1.50    Years: 25.00    Total pack years: 37.50    Types: Cigarettes    Quit date: 08/22/1984    Years since quitting: 37.9   Smokeless tobacco: Never  Vaping Use   Vaping Use: Never used  Substance and Sexual Activity   Alcohol use: Yes    Alcohol/week: 12.0 standard drinks of alcohol    Types: 12 Glasses of wine per week   Drug use: No   Sexual activity: Not on file  Other Topics Concern   Not on file  Social History Narrative   Not on file   Social Determinants of Health   Financial Resource Strain: Not on file  Food Insecurity: Not on file  Transportation Needs: Not on file  Physical Activity: Not on file  Stress: Not on file  Social Connections: Not on file  Intimate Partner Violence: Not on file    Physical Exam: Today's Vitals   07/30/22 0920 07/30/22 0921  BP: (!) 159/84   Pulse: 64   Temp: 98.9 F (37.2 C) 98.9 F (37.2 C)  TempSrc: Skin   SpO2: 95%   Weight: 128 lb (58.1 kg)   Height: 5' 0.5" (1.537 m)    Body mass index is 24.59 kg/m. GEN: NAD EYE: Sclerae anicteric ENT: MMM CV: Non-tachycardic GI: Soft, NT/ND NEURO:  Alert & Oriented x 3  Lab Results: No results for input(s): "WBC", "HGB", "HCT", "PLT" in the last 72 hours. BMET No results for input(s): "NA", "K", "CL", "CO2", "GLUCOSE", "BUN", "CREATININE", "CALCIUM" in the last 72 hours. LFT No results for input(s): "PROT", "ALBUMIN", "AST", "ALT", "ALKPHOS", "BILITOT", "BILIDIR", "IBILI" in the last 72 hours. PT/INR No results for input(s): "LABPROT", "INR" in the last 72 hours.   Impression / Plan: This is a 88 y.o.female who presents for EGD for evaluation of pill dysphagia and pyrosis with possible  dilation.  The risks and benefits of endoscopic evaluation/treatment were discussed with the patient and/or family; these include but are not limited to the risk of perforation, infection, bleeding, missed lesions, lack of diagnosis, severe illness requiring hospitalization, as well as anesthesia and sedation related illnesses.  The patient's history has been reviewed, patient examined, no change in status, and deemed stable for procedure.  The  patient and/or family is agreeable to proceed.    Justice Britain, MD [facility] Office # [phone]

## 2022-07-30 NOTE — Patient Instructions (Addendum)
Await pathology results.  Follow the post dilation diet protocol.  Handouts on esophagitis and stricture, gastritis, heartburn, hiatal hernia, GERD, post dilation diet protocol/instructions provided.  YOU HAD AN ENDOSCOPIC PROCEDURE TODAY AT [facility]:   Refer to the procedure report that was given to you for any specific questions about what was found during the examination.  If the procedure report does not answer your questions, please call your gastroenterologist to clarify.  If you requested that your care partner not be given the details of your procedure findings, then the procedure report has been included in a sealed envelope for you to review at your convenience later.  YOU SHOULD EXPECT: Some feelings of bloating in the abdomen. Passage of more gas than usual.  Walking can help get rid of the air that was put into your GI tract during the procedure and reduce the bloating. If you had a lower endoscopy (such as a colonoscopy or flexible sigmoidoscopy) you may notice spotting of blood in your stool or on the toilet paper. If you underwent a bowel prep for your procedure, you may not have a normal bowel movement for a few days.  Please Note:  You might notice some irritation and congestion in your nose or some drainage.  This is from the oxygen used during your procedure.  There is no need for concern and it should clear up in a day or so.  SYMPTOMS TO REPORT IMMEDIATELY:  Following upper endoscopy (EGD)  Vomiting of blood or coffee ground material  New chest pain or pain under the shoulder blades  Painful or persistently difficult swallowing  New shortness of breath  Fever of 100F or higher  Black, tarry-looking stools  For urgent or emergent issues, a gastroenterologist can be reached at any hour by calling [phone]. Do not use MyChart messaging for urgent concerns.    DIET:  Nothing by mouth until 11:00 a.m.  Clear liquids from 11:00 - 12 noon, then  please follow a soft diet for the rest of today.  You may proceed to your regular diet tomorrow.  Drink plenty of fluids but you should avoid alcoholic beverages for 24 hours.  ACTIVITY:  You should plan to take it easy for the rest of today and you should NOT DRIVE or use heavy machinery until tomorrow (because of the sedation medicines used during the test).    FOLLOW UP: Our staff will call the number listed on your records the next business day following your procedure.  We will call around 7:15- 8:00 am to check on you and address any questions or concerns that you may have regarding the information given to you following your procedure. If we do not reach you, we will leave a message.     If any biopsies were taken you will be contacted by phone or by letter within the next 1-3 weeks.  Please call us at [phone] if you have not heard about the biopsies in 3 weeks.    SIGNATURES/CONFIDENTIALITY: You and/or your care partner have signed paperwork which will be entered into your electronic medical record.  These signatures attest to the fact that that the information above on your After Visit Summary has been reviewed and is understood.  Full responsibility of the confidentiality of this discharge information lies with you and/or your care-partner.

## 2022-07-31 ENCOUNTER — Telehealth: Payer: Self-pay | Admitting: *Deleted

## 2022-07-31 NOTE — Telephone Encounter (Signed)
  Follow up Call-     07/30/2022    9:21 AM  Call back number  Post procedure Call Back phone  # [phone]  Permission to leave phone message Yes     Patient questions:  Do you have a fever, pain , or abdominal swelling? No. Pain Score  0 *  Have you tolerated food without any problems? Yes.    Have you been able to return to your normal activities? Yes.    Do you have any questions about your discharge instructions: Diet   No. Medications  No. Follow up visit  No.  Do you have questions or concerns about your Care? No.  Actions: * If pain score is 4 or above: No action needed, pain <4.

## 2022-08-01 ENCOUNTER — Encounter: Payer: Self-pay | Admitting: Gastroenterology

## 2022-10-29 NOTE — Progress Notes (Signed)
[place] Phone: [phone] Subjective:   Carol Carol [name], am serving as a scribe for Dr. Hulan [name].  I'm seeing this patient by the request  of:  Carol Baton, MD  CC: numbness   [id number]  Carol Carol [name] is a 89 y.o. female coming in with complaint of numbness in hands and feet. Symptoms worsening in the past 2 months. Numbness is also radiating into the lower leg to her. She is having weakness in the L hip when she's tries to abduct hip. Feels like hip is getting "stuck on a bump" on her floor.   Triple fusion in neck and lumbar spine by Dr. Ellene Carol [name] approximately 12 years ago.   Notes muscle soreness in her upper arms. Feels weak and has tingling in both hands. R>L and worse when she wakes up in the middle of the night. Drops a lot of things in the kitchen.       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     Past Medical History:  Diagnosis Date   Arthritis    "back; shoulders" (08/31/2014)   Atypical chest pain 11/21/2019   Cataracts, both eyes    Essential hypertension 07/13/2018   GERD (gastroesophageal reflux disease)    OTC, changed diet   High cholesterol    Hypertension    Hypothyroidism    Past Surgical History:  Procedure Laterality Date   ANTERIOR CERVICAL DECOMP/DISCECTOMY FUSION  02/2010   Dr Carol Carol [name]   BACK SURGERY     COLONOSCOPY W/ POLYPECTOMY     DILATION AND CURETTAGE OF UTERUS     POSTERIOR LUMBAR FUSION  04/2010   Dr Carol Carol [name]   TONSILLECTOMY     age 40   TOTAL SHOULDER ARTHROPLASTY Right 08/31/2014   TOTAL SHOULDER ARTHROPLASTY Right 08/31/2014   Procedure: TOTAL SHOULDER ARTHROPLASTY;  Surgeon: Carol Sells, MD;  Location: [place];  Service: Orthopedics;  Laterality: Right;  Right total shoulder arthroplasty   Social History   Socioeconomic History   Marital status: Divorced    Spouse  name: Not on file   Number of children: Not on file   Years of education: Not on file   Highest education level: Not on file  Occupational History   Not on file  Tobacco Use   Smoking status: Former    Packs/day: 1.50    Years: 25.00    Total pack years: 37.50    Types: Cigarettes    Quit date: 08/22/1984    Years since quitting: 38.2   Smokeless tobacco: Never  Vaping Use   Vaping Use: Never used  Substance and Sexual Activity   Alcohol use: Yes    Alcohol/week: 12.0 standard drinks of alcohol    Types: 12 Glasses of wine per week   Drug use: Carol [name]   Sexual activity: Not on file  Other Topics Concern   Not on file  Social History Narrative   Not on file   Social Determinants of Health   Financial Resource Strain: Not on file  Food Insecurity: Not on file  Transportation Needs: Not on file  Physical Activity: Not on file  Stress: Not on file  Social Connections: Not on file   Carol [name] Known Allergies Family History  Problem Relation Age of Onset   Breast cancer Mother    Ovarian cancer Mother    Alzheimer's disease Mother  Parkinson's disease Father    Heart disease Father    Hypothyroidism Father    Hypertension Sister    Breast cancer Sister    Kidney disease Sister    Prostate cancer Brother    Heart attack Cousin    Colon cancer Neg Hx    Esophageal cancer Neg Hx    Inflammatory bowel disease Neg Hx    Liver disease Neg Hx    Pancreatic cancer Neg Hx    Rectal cancer Neg Hx    Stomach cancer Neg Hx     Current Outpatient Medications (Endocrine & Metabolic):    alendronate (FOSAMAX) 70 MG tablet, Take 1 tablet po q weekly as directed Oral   levothyroxine (SYNTHROID, LEVOTHROID) 112 MCG tablet, Take 112 mcg by mouth daily before breakfast.  Current Outpatient Medications (Cardiovascular):    amLODipine (NORVASC) 2.5 MG tablet, TAKE 3 TABLETS IN THE EVENING FOR BLOOD PRESSURE. (Patient taking differently: TAKE 2 TABLETS IN THE EVENING FOR BLOOD PRESSURE.)    lisinopril (PRINIVIL,ZESTRIL) 20 MG tablet,     Current Outpatient Medications (Hematological):    cyanocobalamin 1000 MCG tablet, Take 1,000 mcg by mouth daily.  Current Outpatient Medications (Other):    AMBULATORY NON FORMULARY MEDICATION, 1 Units by Other Carol [name] once for 1 dose.   b complex vitamins capsule, Take 1 capsule by mouth daily.   CALCIUM PO, Take 1 tablet by mouth daily.   Cholecalciferol (VITAMIN D PO), Take 1 tablet by mouth daily.   clindamycin (CLEOCIN T) 1 % external solution, Apply 1 application topically daily.    hydrocortisone 2.5 % cream, Apply 1 Application topically daily.   latanoprost (XALATAN) 0.005 % ophthalmic solution, Place 1 drop into both eyes at bedtime.   Magnesium 500 MG CAPS, Take 500 mg by mouth daily.   Omega-3 Fatty Acids (FISH OIL PO), Take 1 capsule by mouth daily.   Reviewed prior external information including notes and imaging from  primary care provider As well as notes that were available from care everywhere and other healthcare systems.  Most recent MRI that I have in the computer system is a thoracic wound in July 2022.  Found to have what appeared to be an T9-T10 disc extrusion with cord flattening severe right foraminal narrowing.  L1-L2 spinal stenosis  X-rays were taken today and do not show any significant concern for loosening of any of the hardware at the L3-L4 and L4-L5 posterior fusion.  Past medical history, social, surgical and family history all reviewed in electronic medical record.  Carol [name] pertanent information unless stated regarding to the chief complaint.   Review of Systems:  Carol [name] headache, visual changes, nausea, vomiting, diarrhea, constipation, dizziness, abdominal pain, skin rash, fevers, chills, night sweats, weight loss, swollen lymph nodes,  chest pain, shortness of breath, mood changes. POSITIVE muscle aches, body aches, joint swelling  Objective  Blood pressure 138/78, pulse 94, height 5' 0.5" (1.537 m), weight  130 lb (59 kg), SpO2 96 %.   General: Carol [name] apparent distress alert and oriented x3 mood and affect normal, dressed appropriately.  HEENT: Pupils equal, extraocular movements intact  Patient has significant weakness of the lower extremities.  Patient has weakness with dorsiflexion and plantarflexion of the left foot that is quite significant.  Patient does have some mild neuropathy but seems to be symmetric on my part.  Patient does have weakness even with hip flexion at 3 out of 5 compared to the contralateral side.  Deep tendon reflexes are difficult to assess on  the left side but 2+ on the right side.  2 beats of clonus of the lower extremity noted of the foot.    Impression and Recommendations:    The above documentation has been reviewed and is accurate and complete Lyndal Pulley, DO

## 2022-10-31 ENCOUNTER — Ambulatory Visit (INDEPENDENT_AMBULATORY_CARE_PROVIDER_SITE_OTHER): Payer: Medicare PPO

## 2022-10-31 ENCOUNTER — Ambulatory Visit: Payer: Medicare PPO | Admitting: Family Medicine

## 2022-10-31 VITALS — BP 138/78 | HR 94 | Ht 60.5 in | Wt 130.0 lb

## 2022-10-31 DIAGNOSIS — M48062 Spinal stenosis, lumbar region with neurogenic claudication: Secondary | ICD-10-CM

## 2022-10-31 DIAGNOSIS — M5137 Other intervertebral disc degeneration, lumbosacral region: Secondary | ICD-10-CM | POA: Diagnosis not present

## 2022-10-31 DIAGNOSIS — M8588 Other specified disorders of bone density and structure, other site: Secondary | ICD-10-CM | POA: Diagnosis not present

## 2022-10-31 DIAGNOSIS — M2550 Pain in unspecified joint: Secondary | ICD-10-CM

## 2022-10-31 DIAGNOSIS — M21372 Foot drop, left foot: Secondary | ICD-10-CM | POA: Diagnosis not present

## 2022-10-31 DIAGNOSIS — R269 Unspecified abnormalities of gait and mobility: Secondary | ICD-10-CM

## 2022-10-31 DIAGNOSIS — M545 Low back pain, unspecified: Secondary | ICD-10-CM

## 2022-10-31 LAB — T4, FREE: Free T4: 1.17 ng/dL (ref 0.60–1.60)

## 2022-10-31 LAB — TSH: TSH: 1.64 u[IU]/mL (ref 0.35–5.50)

## 2022-10-31 LAB — COMPREHENSIVE METABOLIC PANEL
ALT: 16 U/L (ref 0–35)
AST: 18 U/L (ref 0–37)
Albumin: 4.5 g/dL (ref 3.5–5.2)
Alkaline Phosphatase: 60 U/L (ref 39–117)
BUN: 19 mg/dL (ref 6–23)
CO2: 28 mEq/L (ref 19–32)
Calcium: 9.9 mg/dL (ref 8.4–10.5)
Chloride: 100 mEq/L (ref 96–112)
Creatinine, Ser: 0.65 mg/dL (ref 0.40–1.20)
GFR: 79.65 mL/min (ref >60.00)
Glucose, Bld: 95 mg/dL (ref 70–99)
Potassium: 3.5 mEq/L (ref 3.5–5.1)
Sodium: 139 mEq/L (ref 135–145)
Total Bilirubin: 0.5 mg/dL (ref 0.2–1.2)
Total Protein: 7.7 g/dL (ref 6.0–8.3)

## 2022-10-31 LAB — T3, FREE: T3, Free: 3.4 pg/mL (ref 2.3–4.2)

## 2022-10-31 LAB — FOLATE: Folate: >23.8 ng/mL (ref >5.9)

## 2022-10-31 LAB — SEDIMENTATION RATE: Sed Rate: 43 mm/hr — ABNORMAL HIGH (ref 0–30)

## 2022-10-31 LAB — VITAMIN B12: Vitamin B-12: 1484 pg/mL — ABNORMAL HIGH (ref 211–911)

## 2022-10-31 MED ORDER — AMBULATORY NON FORMULARY MEDICATION: 1.0000 [IU] | Freq: Once | Status: AC

## 2022-10-31 NOTE — Assessment & Plan Note (Signed)
Patient does have significant left foot drop.  Difficult to tell how long this has been contributing at this time.  We discussed with patient that there is a source of some peripheral neuropathy but I do think some of it is actually central as well.  Patient has had more than [facility] has not had labs at least in our computer system since 2019 and like to repeat to see if anything else is potentially playing a role.  Could consider the possibility of another nerve conduction study but patient has had back previously has had concern that patient is more contacted with deconditioning discussed aquatic physical therapy which patient is going to doing.  We discussed because of the left foot drop we will also get an AFO for further treatment for this.  Do not know if this will make any significant improvement  Will also see if we can get patient's most previous MRI in the last year to see if there is any other nerve impingement that could be contributing and can consider the possibility of injection.  After we have all this information we will change management more possible to further evaluate patient again in 4 to 6 weeks.

## 2022-10-31 NOTE — Assessment & Plan Note (Signed)
Significant neurogenic with left foot drop.  Please see other problems, after reviewing patient's imaging, discussed with patient total time with patient 61 minutes

## 2022-10-31 NOTE — Patient Instructions (Addendum)
[place] Lumbar xray today Labs today See me in 6-8 weeks

## 2022-10-31 NOTE — Assessment & Plan Note (Signed)
Foot drop

## 2022-11-01 LAB — LACTATE DEHYDROGENASE: LDH: 170 U/L (ref 120–250)

## 2022-11-07 LAB — MULTIPLE MYELOMA PANEL, SERUM
Albumin SerPl Elph-Mcnc: 4.2 g/dL (ref 2.9–4.4)
Albumin/Glob SerPl: 1.3 (ref 0.7–1.7)
Alpha 1: 0.2 g/dL (ref 0.0–0.4)
Alpha2 Glob SerPl Elph-Mcnc: 0.8 g/dL (ref 0.4–1.0)
B-Globulin SerPl Elph-Mcnc: 0.8 g/dL (ref 0.7–1.3)
Gamma Glob SerPl Elph-Mcnc: 1.5 g/dL (ref 0.4–1.8)
Globulin, Total: 3.3 g/dL (ref 2.2–3.9)
IgA/Immunoglobulin A, Serum: 86 mg/dL (ref 64–422)
IgG (Immunoglobin G), Serum: 1561 mg/dL (ref 586–1602)
IgM (Immunoglobulin M), Srm: 69 mg/dL (ref 26–217)
M Protein SerPl Elph-Mcnc: 1 g/dL — ABNORMAL HIGH
Total Protein: 7.5 g/dL (ref 6.0–8.5)

## 2022-11-18 ENCOUNTER — Telehealth: Payer: Self-pay | Admitting: Family Medicine

## 2022-11-18 NOTE — Telephone Encounter (Addendum)
Patient called stating that Dr Tamala Julian recommended that she establish care with Dr Raoul Pitch. She was concerned about driving to [facility] and asked who Dr Tamala Julian would suggest here at [facility] (Dr Sharlet Salina, Dr Mitchel Honour, Harland Dingwall and Jeralyn Ruths are taking new patients)?  She also asked if someone could call her to discuss her labs as well?   Please advise.

## 2022-11-19 NOTE — Telephone Encounter (Signed)
Sent patient MyChart recommendations.

## 2022-11-24 DIAGNOSIS — D485 Neoplasm of uncertain behavior of skin: Secondary | ICD-10-CM | POA: Diagnosis not present

## 2022-11-24 DIAGNOSIS — C44629 Squamous cell carcinoma of skin of left upper limb, including shoulder: Secondary | ICD-10-CM | POA: Diagnosis not present

## 2022-11-24 DIAGNOSIS — L719 Rosacea, unspecified: Secondary | ICD-10-CM | POA: Diagnosis not present

## 2022-12-03 ENCOUNTER — Other Ambulatory Visit: Payer: Self-pay

## 2022-12-03 ENCOUNTER — Telehealth: Payer: Self-pay | Admitting: Family Medicine

## 2022-12-03 DIAGNOSIS — M48062 Spinal stenosis, lumbar region with neurogenic claudication: Secondary | ICD-10-CM

## 2022-12-03 NOTE — Telephone Encounter (Signed)
Patient called stating that we referred her for water therapy and she is scheduled to have an evaluation on 3/26.  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 It seems that this could be an ongoing thing and she is supposed to avoid submerging the area in water.   She would still like to have therapy but asked if we could refer her to a different kind of therapy to avoid being in the water.   Please advise.

## 2022-12-03 NOTE — Telephone Encounter (Signed)
Referral placed to [facility]. Patient notified.

## 2022-12-15 NOTE — Progress Notes (Signed)
[place] Phone: [phone] Subjective:   Fontaine No, am serving as a scribe for Dr. Hulan Saas.  I'm seeing this patient by the request  of:  Shon Baton, MD  CC: low back and foot pain   [id number]  10/31/2022 Patient does have significant left foot drop.  Difficult to tell how long this has been contributing at this time.  We discussed with patient that there is a source of some peripheral neuropathy but I do think some of it is actually central as well.  Patient has had more than MGUS has not had labs at least in our computer system since 2019 and like to repeat to see if anything else is potentially playing a role.  Could consider the possibility of another nerve conduction study but patient has had back previously has had concern that patient is more contacted with deconditioning discussed aquatic physical therapy which patient is going to doing.  We discussed because of the left foot drop we will also get an AFO for further treatment for this.  Do not know if this will make any significant improvement   Will also see if we can get patient's most previous MRI in the last year to see if there is any other nerve impingement that could be contributing and can consider the possibility of injection.  After we have all this information we will change management more possible to further evaluate patient again in 4 to 6 weeks.  Update 12/24/2022 SAYLA KALEN is a 89 y.o. female coming in with complaint of lumbar spine pain and L foot drop. Patient states that she was busy with hosting her family and taking care of a family dog. Notices that she is walking differently and has some L knee pain due to her gait. Has not started therapy yet but is going to start in near future.       Past Medical History:  Diagnosis Date   Arthritis    "back; shoulders" (08/31/2014)   Atypical chest pain 11/21/2019   Cataracts,  both eyes    Essential hypertension 07/13/2018   GERD (gastroesophageal reflux disease)    OTC, changed diet   High cholesterol    Hypertension    Hypothyroidism    Past Surgical History:  Procedure Laterality Date   ANTERIOR CERVICAL DECOMP/DISCECTOMY FUSION  02/2010   Dr Ellene Route   BACK SURGERY     COLONOSCOPY W/ POLYPECTOMY     DILATION AND CURETTAGE OF UTERUS     POSTERIOR LUMBAR FUSION  04/2010   Dr Ellene Route   TONSILLECTOMY     age 19   TOTAL SHOULDER ARTHROPLASTY Right 08/31/2014   TOTAL SHOULDER ARTHROPLASTY Right 08/31/2014   Procedure: TOTAL SHOULDER ARTHROPLASTY;  Surgeon: Nita Sells, MD;  Location: [facility];  Service: Orthopedics;  Laterality: Right;  Right total shoulder arthroplasty   Social History   Socioeconomic History   Marital status: Divorced    Spouse name: Not on file   Number of children: Not on file   Years of education: Not on file   Highest education level: Not on file  Occupational History   Not on file  Tobacco Use   Smoking status: Former    Packs/day: 1.50    Years: 25.00    Additional pack years: 0.00    Total pack years: 37.50    Types: Cigarettes    Quit date: 08/22/1984    Years since  quitting: 38.3   Smokeless tobacco: Never  Vaping Use   Vaping Use: Never used  Substance and Sexual Activity   Alcohol use: Yes    Alcohol/week: 12.0 standard drinks of alcohol    Types: 12 Glasses of wine per week   Drug use: No   Sexual activity: Not on file  Other Topics Concern   Not on file  Social History Narrative   Not on file   Social Determinants of Health   Financial Resource Strain: Not on file  Food Insecurity: Not on file  Transportation Needs: Not on file  Physical Activity: Not on file  Stress: Not on file  Social Connections: Not on file   No Known Allergies Family History  Problem Relation Age of Onset   Breast cancer Mother    Ovarian cancer Mother    Alzheimer's disease Mother    Parkinson's disease Father     Heart disease Father    Hypothyroidism Father    Hypertension Sister    Breast cancer Sister    Kidney disease Sister    Prostate cancer Brother    Heart attack Cousin    Colon cancer Neg Hx    Esophageal cancer Neg Hx    Inflammatory bowel disease Neg Hx    Liver disease Neg Hx    Pancreatic cancer Neg Hx    Rectal cancer Neg Hx    Stomach cancer Neg Hx     Current Outpatient Medications (Endocrine & Metabolic):    alendronate (FOSAMAX) 70 MG tablet, Take 1 tablet po q weekly as directed Oral   levothyroxine (SYNTHROID, LEVOTHROID) 112 MCG tablet, Take 112 mcg by mouth daily before breakfast.  Current Outpatient Medications (Cardiovascular):    amLODipine (NORVASC) 2.5 MG tablet, TAKE 3 TABLETS IN THE EVENING FOR BLOOD PRESSURE. (Patient taking differently: TAKE 2 TABLETS IN THE EVENING FOR BLOOD PRESSURE.)   lisinopril (PRINIVIL,ZESTRIL) 20 MG tablet,     Current Outpatient Medications (Hematological):    cyanocobalamin 1000 MCG tablet, Take 1,000 mcg by mouth daily.  Current Outpatient Medications (Other):    b complex vitamins capsule, Take 1 capsule by mouth daily.   CALCIUM PO, Take 1 tablet by mouth daily.   Cholecalciferol (VITAMIN D PO), Take 1 tablet by mouth daily.   clindamycin (CLEOCIN T) 1 % external solution, Apply 1 application topically daily.    hydrocortisone 2.5 % cream, Apply 1 Application topically daily.   latanoprost (XALATAN) 0.005 % ophthalmic solution, Place 1 drop into both eyes at bedtime.   Magnesium 500 MG CAPS, Take 500 mg by mouth daily.   Omega-3 Fatty Acids (FISH OIL PO), Take 1 capsule by mouth daily.   Reviewed prior external information including notes and imaging from  primary care provider As well as notes that were available from care everywhere and other healthcare systems.  Past medical history, social, surgical and family history all reviewed in electronic medical record.  No pertanent information unless stated regarding to the  chief complaint.   Review of Systems:  No headache, visual changes, nausea, vomiting, diarrhea, constipation, dizziness, abdominal pain, skin rash, fevers, chills, night sweats, weight loss, swollen lymph nodes, body aches, joint swelling, chest pain, shortness of breath, mood changes. POSITIVE muscle aches  Objective  Blood pressure (!) 150/82, pulse 74, height 5' (1.524 m), weight 129 lb (58.5 kg), SpO2 96 %.   General: No apparent distress alert and oriented x3 mood and affect normal, dressed appropriately.  HEENT: Pupils equal, extraocular movements intact  Respiratory: Patient's speak in full sentences and does not appear short of breath  Cardiovascular: No lower extremity edema, non tender, no erythema  Low back shows significant loss of lordosis.  Diffuse tenderness to palpation in the paraspinal musculature.  Left foot exam significant foot drop noted.  Patient has 1 out of 5 strength of dorsiflexion and 2 out of 5 strength plantarflexion of the left foot.  Patient states that she is neurovascularly intact.     Impression and Recommendations:    The above documentation has been reviewed and is accurate and complete Lyndal Pulley, DO

## 2022-12-16 ENCOUNTER — Ambulatory Visit (HOSPITAL_BASED_OUTPATIENT_CLINIC_OR_DEPARTMENT_OTHER): Payer: Medicare PPO | Admitting: Physical Therapy

## 2022-12-16 ENCOUNTER — Other Ambulatory Visit: Payer: Self-pay

## 2022-12-16 ENCOUNTER — Ambulatory Visit (HOSPITAL_BASED_OUTPATIENT_CLINIC_OR_DEPARTMENT_OTHER): Payer: Medicare PPO | Attending: Family Medicine | Admitting: Physical Therapy

## 2022-12-16 ENCOUNTER — Encounter (HOSPITAL_BASED_OUTPATIENT_CLINIC_OR_DEPARTMENT_OTHER): Payer: Self-pay | Admitting: Physical Therapy

## 2022-12-16 DIAGNOSIS — R2689 Other abnormalities of gait and mobility: Secondary | ICD-10-CM

## 2022-12-16 DIAGNOSIS — R269 Unspecified abnormalities of gait and mobility: Secondary | ICD-10-CM | POA: Diagnosis not present

## 2022-12-16 DIAGNOSIS — M545 Low back pain, unspecified: Secondary | ICD-10-CM | POA: Insufficient documentation

## 2022-12-16 DIAGNOSIS — R2681 Unsteadiness on feet: Secondary | ICD-10-CM | POA: Diagnosis not present

## 2022-12-16 DIAGNOSIS — M6281 Muscle weakness (generalized): Secondary | ICD-10-CM | POA: Insufficient documentation

## 2022-12-16 NOTE — Therapy (Signed)
OUTPATIENT PHYSICAL THERAPY THORACOLUMBAR EVALUATION   Patient Name: Carol Woods MRN: [id number] DOB:1934-07-01, 89 y.o., female Today's Date: 12/16/2022  END OF SESSION:  PT End of Session - 12/16/22 1411     Visit Number 1    Number of Visits 16    Date for PT Re-Evaluation 02/24/23    Authorization Type [employer] mcr    PT Start Time 1203    PT Stop Time 1245    PT Time Calculation (min) 42 min    Activity Tolerance Patient tolerated treatment well    Behavior During Therapy WFL for tasks assessed/performed             Past Medical History:  Diagnosis Date   Arthritis    "back; shoulders" (08/31/2014)   Atypical chest pain 11/21/2019   Cataracts, both eyes    Essential hypertension 07/13/2018   GERD (gastroesophageal reflux disease)    OTC, changed diet   High cholesterol    Hypertension    Hypothyroidism    Past Surgical History:  Procedure Laterality Date   ANTERIOR CERVICAL DECOMP/DISCECTOMY FUSION  02/2010   Dr Ellene Route   BACK SURGERY     COLONOSCOPY W/ POLYPECTOMY     DILATION AND CURETTAGE OF UTERUS     POSTERIOR LUMBAR FUSION  04/2010   Dr Ellene Route   TONSILLECTOMY     age 64   TOTAL SHOULDER ARTHROPLASTY Right 08/31/2014   TOTAL SHOULDER ARTHROPLASTY Right 08/31/2014   Procedure: TOTAL SHOULDER ARTHROPLASTY;  Surgeon: Nita Sells, MD;  Location: [facility];  Service: Orthopedics;  Laterality: Right;  Right total shoulder arthroplasty   Patient Active Problem List   Diagnosis Date Noted   Left foot drop 10/31/2022   Pill dysphagia 07/11/2022   Change in bowel habits 07/11/2022   History of colonic polyps 07/11/2022   GERD (gastroesophageal reflux disease) 11/21/2019   Atypical chest pain 11/21/2019   Essential hypertension 07/13/2018   Thoracic spinal stenosis 03/18/2018   Spinal stenosis of lumbar region 03/18/2018   Gait abnormality 02/04/2018   Paresthesia 02/04/2018   MGUS (monoclonal gammopathy of unknown significance) 12/02/2017    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  I will not be able to get into the pool until it heals.  I am not having any pain today.  It comes and goes without any particular reason. Cervical surgery and Lumbar surgery >10 yrs. Using cane >5 yrs.  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 I feel like I have gotten worse last few months, weaker very afraid of falling.  I live alone with my cat.   PERTINENT HISTORY:  As per Dr Tamala Julian 10/31/22: As well as notes that were available from care everywhere and other healthcare systems.  Most recent MRI that I have in the computer system is a thoracic wound in July 2022.  Found to have what appeared to be an T9-T10 disc extrusion with cord flattening severe right foraminal narrowing.  L1-L2 spinal stenosis   X-rays were taken today and do not show any significant concern for loosening of any of the hardware at the L3-L4 and L4-L5 posterior fusion.  Patient does have significant left foot drop. Difficult to tell how long this has been contributing at this time       PAIN:  Are you having pain? Yes: NPRS scale: current 0/10  worst 6/10 Pain location: legs and feet; arms and hand Pain description:  sharp, burning/hot, cold intermittent Aggravating factors: unknown Relieving factors: unknown  PRECAUTIONS: Fall  WEIGHT BEARING RESTRICTIONS: No  FALLS:  Has patient fallen in last 6 months? No  LIVING ENVIRONMENT: Lives with: lives with their family Lives in: House/apartment Stairs: Yes: External: 4 steps; on right going up Has following equipment at home: Single point cane  OCCUPATION: retired  PLOF: Independent  PATIENT GOALS: gain strength  NEXT MD VISIT: 12/22/22  OBJECTIVE:   DIAGNOSTIC FINDINGS:  DG Lumbar Spine 10/31/22 MPRESSION: Postsurgical changes of L2-L5 without evidence of hardware complication.  PATIENT SURVEYS:  FOTO risk adjusted 47% with goal of 57% in 11 visit  SCREENING FOR RED FLAGS: Bowel or bladder incontinence: No Spinal tumors: No Cauda equina syndrome: No Compression fracture: No Abdominal aneurysm: No  COGNITION: Overall cognitive status: Within functional limits for tasks assessed     SENSATION: Neuropathies hands and feet  MUSCLE LENGTH: Hamstrings:  Tba next session   POSTURE: No Significant postural limitations  PALPATION: wfl  LUMBAR ROM:   AROM eval  Flexion   Extension   Right lateral flexion   Left lateral flexion   Right rotation   Left rotation    unable to properly test due to pt inability to stand unsupported  LOWER EXTREMITY ROM:     wfl  LOWER EXTREMITY MMT:    MMT Right eval Left eval  Hip flexion 3+ 3+  Hip extension 3 3  Hip abduction 4 4  Hip adduction 5 5  Hip internal rotation    Hip external rotation    Knee flexion 3+ 3+  Knee extension 3+ 3+  Ankle dorsiflexion 3- 2  Ankle plantarflexion    Ankle inversion    Ankle eversion     (Blank rows = not tested)  LUMBAR SPECIAL TESTS:    FUNCTIONAL TESTS:  5 times sit to stand: 25.09 from bench with ue use Timed up and go (TUG): 17.32 Berg: TBA next session  GAIT: Distance walked: 435ft Assistive device utilized: Single point  cane Level of assistance:  Modified independence Comments: Pt amb with bilateral steppage pattern L>R  TODAY'S TREATMENT:                                                                                                                              Eval Objective testing Pt edu    PATIENT EDUCATION:  Education details: Discussed eval findings, rehab rationale, aquatic program progression/POC and pools in area. Patient is in agreement  Person educated: Patient Education method: Explanation Education comprehension: verbalized understanding  HOME EXERCISE PROGRAM: tba  ASSESSMENT:  CLINICAL IMPRESSION: Patient is a 89 y.o. f who was seen today for physical therapy evaluation and treatment for spinal stenosis of lumbar spine. She presents with general weakness throughout extremities and core as well as intermittent paresthesias and numbness in extremities.  She does have a PMHx which includes surgical intervention for cervical decompression/discectomy and laminectomy back in 2011 and post fusion L3-5 in past 10 yrs.  She report she has been losing strength and becoming increasingly unsteady over the past 3 months.  She does live alone and using cane at all times.  Objective testing demonstrates significant decrease in LE strength with left foot drop, gait is unsteady and her fall risk high. She will be having a mohs procedure on her left hand 4/1 and will be unable to get into pool until released by MD.  Plan to further assess lumbar spine further after return to therapy.  She will benefit from skilled physical therapy intervention in aquatic setting to use the properties of water for safety with strengthening and balance retraining. Will transition to land when approp.  OBJECTIVE IMPAIRMENTS: Abnormal gait, decreased activity tolerance, decreased balance, decreased coordination, decreased endurance, decreased mobility, difficulty walking, decreased strength, decreased safety awareness, impaired  perceived functional ability, impaired sensation, and pain.   ACTIVITY LIMITATIONS: carrying, lifting, bending, sitting, standing, squatting, sleeping, stairs, transfers, bed mobility, and locomotion level  PARTICIPATION LIMITATIONS: meal prep, cleaning, laundry, shopping, community activity, and yard work  PERSONAL FACTORS: Age and Time since onset of injury/illness/exacerbation are also affecting patient's functional outcome.   REHAB POTENTIAL: Fair    CLINICAL DECISION MAKING: Unstable/unpredictable  EVALUATION COMPLEXITY: High   GOALS: Goals reviewed with patient? Yes  SHORT TERM GOALS: Target date: 01/24/23  Pt will tolerate full aquatic sessions consistently without increase in pain and with improving function to demonstrate good toleration and effectiveness of intervention.   Baseline:TBA Goal status: INITIAL  2.  Pt will tolerate stair climbing using alternating pattern ascending and descending 6 steps with use of handrail to demonstrate improving le strength Baseline: TBA  Goal status: INITIAL  3.  Pt will improve on Tug test to <or= 13s to demonstrate improvement in lower extremity function, mobility and decreased fall risk.  Baseline: 17.32 Goal status: INITIAL  4.  Pt will be compliant with use of AFO to improve gait Baseline: being fabricated Goal status: INITIAL    LONG TERM GOALS: Target date: 6/54/24  Pt will be indep with final HEP's (land and aquatic as appropriate) for continued management of condition  Baseline:  Goal status: INITIAL  2.  Pt to meet stated Foto Goal of 57% Baseline:Risk adjusted 47% Goal status: INITIAL  3.  Pt will improve on 5 X STS test to <or= 15s to demonstrate improving functional lower extremity strength, transitional movements, and balance  Baseline: 25.09 from bench with ue use Goal status: INITIAL  4.  Pt will improve on Berg balance test to >/= 40/56 to demonstrate a decrease in fall risk.  Baseline: TBA  Goal status:  INITIAL  5.  Pt will report no falls Baseline: High fall risk Goal status: INITIAL  6.  Pt will improve strength in all areas with deficits by at least 1 full grade to demonstrate improved overall physical function  Baseline: see chart Goal status: INITIAL  PLAN:  PT FREQUENCY: 1-2x/week  PT DURATION: 10 weeks 16 visits Pt to be delayed a few weeks for hand surgery  PLANNED INTERVENTIONS: Therapeutic exercises, Therapeutic activity, Neuromuscular re-education, Balance training, Gait training, Patient/Family education, Self Care, Joint mobilization, Stair training, Orthotic/Fit training, DME instructions, Aquatic Therapy, Dry Needling, Electrical stimulation, Cryotherapy, Moist heat, Taping, Ionotophoresis 4mg /ml Dexamethasone, Manual therapy, and Re-evaluation.  PLAN FOR NEXT SESSION: re-assess if >4 weeks between eval and return to therapy. Aquatics for strengthening LE and core, balance, proprioception and gait retraining.   Stanton Kidney Tharon Aquas) Jaszmine Navejas MPT 12/16/2022, 2:16 PM   Referring diagnosis? Lumbar stenosis Treatment diagnosis? (if different than referring diagnosis) same What was this (referring dx) caused by? []  Surgery []  Fall [x]  Ongoing issue []  Arthritis []  Other: ____________  Laterality: []  Rt []  Lt [x]  Both  Check all possible CPT codes:  *CHOOSE 10 OR LESS*    [x]  97110 (Therapeutic Exercise)  []  92507 (SLP Treatment)  [x]  97112 (Neuro Re-ed)   []  92526 (Swallowing Treatment)   [x]  97116 (Gait Training)   []  [id number] (Cognitive Training, 1st 15 minutes) [x]  97140 (Manual Therapy)   []  97130 (Cognitive Training, each add'l 15 minutes)  [x]  97164 (Re-evaluation)                              []  Other, List CPT Code ____________  [x]  97530 (Therapeutic Activities)     [x]  97535 (Self Care)   [x]  All codes above (97110 - 97535)  []  97012 (Mechanical Traction)  [x]  97014 (E-stim Unattended)  [x]  97032 (E-stim manual)  [x]  97033 (Ionto)  []  97035 (Ultrasound) [x]   97750 (Physical Performance Training) [x]  [id number] (Aquatic Therapy) []  97016 (Vasopneumatic Device) []  [id number] (Paraffin) []  97034 (Contrast Bath) []  97597 (Wound Care 1st 20 sq cm) []  97598 (Wound Care each add'l 20 sq cm) []  97760 (Orthotic Fabrication, Fitting, Training Initial) []  [id number] (Prosthetic Management and Training Initial) [x]  [id number] (Orthotic or Prosthetic Training/ Modification Subsequent)

## 2022-12-22 DIAGNOSIS — C44629 Squamous cell carcinoma of skin of left upper limb, including shoulder: Secondary | ICD-10-CM | POA: Diagnosis not present

## 2022-12-24 ENCOUNTER — Ambulatory Visit: Payer: Medicare PPO | Admitting: Family Medicine

## 2022-12-24 VITALS — BP 150/82 | HR 74 | Ht 60.0 in | Wt 129.0 lb

## 2022-12-24 DIAGNOSIS — M21372 Foot drop, left foot: Secondary | ICD-10-CM

## 2022-12-24 DIAGNOSIS — M48062 Spinal stenosis, lumbar region with neurogenic claudication: Secondary | ICD-10-CM

## 2022-12-24 DIAGNOSIS — M545 Low back pain, unspecified: Secondary | ICD-10-CM

## 2022-12-24 NOTE — Assessment & Plan Note (Signed)
Still recommend the AFO as well as the physical therapy.  Total time discussed with patient as well as reviewing outside imaging outside MyChart messages and phone calls 31 minutes

## 2022-12-24 NOTE — Assessment & Plan Note (Signed)
Concern for severe overall, still has left foot drop noted.  Concerned that this is actually even worse with now some worsening plantarflexion.  Patient has been somewhat noncompliant secondary to family being in town and was not able to do physical therapy and has not been able to do the AFO yet.  But secondary to the worsening we did discuss that the last MRI of the lumbar spine has been quite some time and do feel that further imaging is necessary.  Depending on findings she could be a candidate for potential injections patient is in agreement with the plan.  Knows if worsening symptoms to seek medical attention immediately.

## 2022-12-24 NOTE — Patient Instructions (Addendum)
Good to see you MRI lumbar [phone] See me again in 2 months

## 2022-12-29 DIAGNOSIS — H353132 Nonexudative age-related macular degeneration, bilateral, intermediate dry stage: Secondary | ICD-10-CM | POA: Diagnosis not present

## 2022-12-29 DIAGNOSIS — H5310 Unspecified subjective visual disturbances: Secondary | ICD-10-CM | POA: Diagnosis not present

## 2022-12-29 DIAGNOSIS — H26491 Other secondary cataract, right eye: Secondary | ICD-10-CM | POA: Diagnosis not present

## 2022-12-31 ENCOUNTER — Ambulatory Visit
Admission: RE | Admit: 2022-12-31 | Discharge: 2022-12-31 | Disposition: A | Discharge status: Home / Self Care | Payer: Medicare PPO | Source: Ambulatory Visit | Attending: Family Medicine | Admitting: Family Medicine

## 2022-12-31 DIAGNOSIS — M48061 Spinal stenosis, lumbar region without neurogenic claudication: Secondary | ICD-10-CM | POA: Diagnosis not present

## 2022-12-31 DIAGNOSIS — M545 Low back pain, unspecified: Secondary | ICD-10-CM

## 2023-01-01 ENCOUNTER — Ambulatory Visit (HOSPITAL_BASED_OUTPATIENT_CLINIC_OR_DEPARTMENT_OTHER): Payer: Medicare PPO | Admitting: Physical Therapy

## 2023-01-09 ENCOUNTER — Encounter (HOSPITAL_BASED_OUTPATIENT_CLINIC_OR_DEPARTMENT_OTHER): Payer: Self-pay | Admitting: Physical Therapy

## 2023-01-09 ENCOUNTER — Ambulatory Visit (HOSPITAL_BASED_OUTPATIENT_CLINIC_OR_DEPARTMENT_OTHER): Payer: Medicare PPO | Attending: Family Medicine | Admitting: Physical Therapy

## 2023-01-09 DIAGNOSIS — M6281 Muscle weakness (generalized): Secondary | ICD-10-CM | POA: Diagnosis not present

## 2023-01-09 DIAGNOSIS — R2681 Unsteadiness on feet: Secondary | ICD-10-CM | POA: Insufficient documentation

## 2023-01-09 DIAGNOSIS — R2689 Other abnormalities of gait and mobility: Secondary | ICD-10-CM | POA: Diagnosis not present

## 2023-01-09 DIAGNOSIS — M21372 Foot drop, left foot: Secondary | ICD-10-CM | POA: Diagnosis not present

## 2023-01-09 NOTE — Therapy (Signed)
OUTPATIENT PHYSICAL THERAPY THORACOLUMBAR TREATMENT   Patient Name: Carol Woods MRN: [id number] DOB:12-04-1933, 89 y.o., female Today's Date: 01/09/2023  END OF SESSION:  PT End of Session - 01/09/23 1432     Visit Number 2    Number of Visits 16    Date for PT Re-Evaluation 02/24/23    Authorization Type human[employer] m[employer]    PT Start Time 1430    PT Stop Time 1510    PT Time Calculation (min) 40 min    Activity Tolerance Patient tolerated treatment well    Behavior During Therapy WFL for tasks assessed/performed             Past Medical History:  Diagnosis Date   Arthritis    "back; shoulders" (08/31/2014)   Atypical chest pain 11/21/2019   Cataracts, both eyes    Essential hypertension 07/13/2018   GERD (gastroesophageal reflux disease)    OTC, changed diet   High cholesterol    Hypertension    Hypothyroidism    Past Surgical History:  Procedure Laterality Date   ANTERIOR CERVICAL DECOMP/DISCECTOMY FUSION  02/2010   Dr Danielle Dess   BACK SURGERY     COLONOSCOPY W/ POLYPECTOMY     DILATION AND CURETTAGE OF UTERUS     POSTERIOR LUMBAR FUSION  04/2010   Dr Danielle Dess   TONSILLECTOMY     age 25   TOTAL SHOULDER ARTHROPLASTY Right 08/31/2014   TOTAL SHOULDER ARTHROPLASTY Right 08/31/2014   Procedure: TOTAL SHOULDER ARTHROPLASTY;  Surgeon: Mable Paris, MD;  Location: [facility] OR;  Service: Orthopedics;  Laterality: Right;  Right total shoulder arthroplasty   Patient Active Problem List   Diagnosis Date Noted   Left foot drop 10/31/2022   Pill dysphagia 07/11/2022   Change in bowel habits 07/11/2022   History of colonic polyps 07/11/2022   GERD (gastroesophageal reflux disease) 11/21/2019   Atypical chest pain 11/21/2019   Essential hypertension 07/13/2018   Thoracic spinal stenosis 03/18/2018   Spinal stenosis of lumbar region 03/18/2018   Gait abnormality 02/04/2018   Paresthesia 02/04/2018   MGUS (monoclonal gammopathy of unknown significance) 12/02/2017    Status post total shoulder arthroplasty 08/31/2014    PCP: [name], MD   REFERRING PROVIDER: Judi Saa, DO   REFERRING DIAG: [phone] (ICD-10-CM) - Spinal stenosis of lumbar region with neurogenic claudication   Rationale for Evaluation and Treatment: Rehabilitation  THERAPY DIAG:  Muscle weakness (generalized)  Unsteadiness on feet  Other abnormalities of gait and mobility  ONSET DATE: exacerbated last few months  SUBJECTIVE:  SUBJECTIVE STATEMENT: 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  I will not be able to get into the pool until it heals.  I am not having any pain today.  It comes and goes without any particular reason. Cervical surgery and Lumbar surgery >10 yrs. Using cane >5 yrs.  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 I feel like I have gotten worse last few months, weaker very afraid of falling.  I live alone with my cat.   PERTINENT HISTORY:  As per Dr Tamala Julian 10/31/22: As well as notes that were available from care everywhere and other healthcare systems.  Most recent MRI that I have in the computer system is a thoracic wound in July 2022.  Found to have what appeared to be an T9-T10 disc extrusion with cord flattening severe right foraminal narrowing.  L1-L2 spinal stenosis   X-rays were taken today and do not show any significant concern for loosening of any of the hardware at the L3-L4 and L4-L5 posterior fusion.  Patient does have significant left foot drop. Difficult to tell how long this has been contributing at this time       PAIN:  Are you having pain? Yes: NPRS scale: current 0/10  worst 6/10 Pain location: legs and feet; arms and hand Pain description:  sharp, burning/hot, cold intermittent Aggravating factors: unknown Relieving factors: unknown  PRECAUTIONS: Fall  WEIGHT BEARING RESTRICTIONS: No  FALLS:  Has patient fallen in last 6 months? No  LIVING ENVIRONMENT: Lives with: lives with their family Lives in: House/apartment Stairs: Yes: External: 4 steps; on right going up Has following equipment at home: Single point cane  OCCUPATION: retired  PLOF: Independent  PATIENT GOALS: gain strength  NEXT MD VISIT: 12/22/22  OBJECTIVE:   DIAGNOSTIC FINDINGS:  DG Lumbar Spine 10/31/22 MPRESSION: Postsurgical changes of L2-L5 without evidence of hardware complication.  PATIENT SURVEYS:  FOTO risk adjusted 47% with goal of 57% in 11 visit  SCREENING FOR RED FLAGS: Bowel or bladder incontinence: No Spinal tumors: No Cauda equina syndrome: No Compression fracture: No Abdominal aneurysm: No  COGNITION: Overall cognitive status: Within functional limits for tasks assessed     SENSATION: Neuropathies hands and feet  MUSCLE LENGTH: Hamstrings:  Tba next session   POSTURE: No Significant postural limitations  PALPATION: wfl  LUMBAR ROM:   AROM eval  Flexion   Extension   Right lateral flexion   Left lateral flexion   Right rotation   Left rotation    unable to properly test due to pt inability to stand unsupported  LOWER EXTREMITY ROM:     wfl  LOWER EXTREMITY MMT:    MMT Right eval Left eval  Hip flexion 3+ 3+  Hip extension 3 3  Hip abduction 4 4  Hip adduction 5 5  Hip internal rotation    Hip external rotation    Knee flexion 3+ 3+  Knee extension 3+ 3+  Ankle dorsiflexion 3- 2  Ankle plantarflexion    Ankle inversion    Ankle eversion     (Blank rows = not tested)  LUMBAR SPECIAL TESTS:    FUNCTIONAL TESTS:  5 times sit to stand: 25.09 from bench with ue use Timed up and go (TUG): 17.32 Berg: TBA next session  GAIT: Distance walked: 435ft Assistive device utilized: Single point  cane Level of assistance:  Modified independence Comments: Pt amb with bilateral steppage pattern L>R  TODAY'S TREATMENT:                                                                                                                              01/09/23  Donned pt with Lt AFO. Therapeutic exercise: At counter with light UE support - side stepping R/L; tandem gait forward /backward ( post LOB- minA to correct);  tandem gait forward and backwards walking (CGA for safety);  squats x 5;  hip abdct/ addct x 10 each; marching  STS with slow eccentric lowering with forward arm reach and cues for hip hinge x 8  Gait:  80 ft with SBA with AFO and SPC in Rt hand, unsteady with horiz head turns  Therapeutic Ex:  NuStep (LE only- without AFO) L4 ->3 x 5 min    PATIENT EDUCATION:  Education details: Discussed eval findings, rehab rationale, aquatic program progression/POC and pools in area. Patient is in agreement  Person educated: Patient Education method: Explanation Education comprehension: verbalized understanding  HOME EXERCISE PROGRAM: tba  ASSESSMENT:  CLINICAL IMPRESSION: Pt had land session due to recent dermatology surgery on hand; awaiting clearance from MD to get in water. Incision on Lt lateral hand appears well healed, fully closed without any scabs.   She tolerated all exercises without production of pain.Pt was instructed and assisted in donning new AFO on Lt foot.  She had minor red area at Lt lateral malleolus after of wear time during session. She was unsteady with gait with horiz head turns to scan area and speak with therapist; SBA used throughout session for safety. She required one episode of CGA-MinA to correct posterior LOB with backward tandem gait at counter   Verbally assigned STS with UE touching table and marching/side stepping at counter.  Will expand HEP in upcoming weeks.   Pt verbalized that she knows how to swim but hasn't been in pool in a long time.  May  benefit from therapist in water or providing close SBA until pt more confident in water. Goals are ongoing.    From Eval: Patient is a 89 y.o. f who was seen today for physical therapy evaluation and treatment for spinal stenosis of lumbar spine. She presents with general weakness throughout extremities and core as well as intermittent paresthesias and numbness in extremities.  She does have a PMHx which includes surgical intervention for cervical decompression/discectomy and laminectomy back in 2011 and post fusion L3-5 in past 10 yrs.  She report she has been losing strength and becoming increasingly unsteady over the past 3 months.  She does live alone and using cane at all times.  Objective testing demonstrates significant decrease in LE strength with left foot drop, gait is unsteady and her fall risk high. She will be having a mohs procedure on her left hand 4/1 and will be unable to get into pool until released by MD.  Plan to  further assess lumbar spine further after return to therapy.  She will benefit from skilled physical therapy intervention in aquatic setting to use the properties of water for safety with strengthening and balance retraining. Will transition to land when approp.  OBJECTIVE IMPAIRMENTS: Abnormal gait, decreased activity tolerance, decreased balance, decreased coordination, decreased endurance, decreased mobility, difficulty walking, decreased strength, decreased safety awareness, impaired perceived functional ability, impaired sensation, and pain.   ACTIVITY LIMITATIONS: carrying, lifting, bending, sitting, standing, squatting, sleeping, stairs, transfers, bed mobility, and locomotion level  PARTICIPATION LIMITATIONS: meal prep, cleaning, laundry, shopping, community activity, and yard work  PERSONAL FACTORS: Age and Time since onset of injury/illness/exacerbation are also affecting patient's functional outcome.   REHAB POTENTIAL: Fair    CLINICAL DECISION MAKING:  Unstable/unpredictable  EVALUATION COMPLEXITY: High   GOALS: Goals reviewed with patient? Yes  SHORT TERM GOALS: Target date: 01/24/23  Pt will tolerate full aquatic sessions consistently without increase in pain and with improving function to demonstrate good toleration and effectiveness of intervention.   Baseline:TBA Goal status: INITIAL  2.  Pt will tolerate stair climbing using alternating pattern ascending and descending 6 steps with use of handrail to demonstrate improving le strength Baseline: TBA  Goal status: INITIAL  3.  Pt will improve on Tug test to <or= 13s to demonstrate improvement in lower extremity function, mobility and decreased fall risk.  Baseline: 17.32 Goal status: INITIAL  4.  Pt will be compliant with use of AFO to improve gait Baseline: being fabricated Goal status: INITIAL    LONG TERM GOALS: Target date: 6/54/24  Pt will be indep with final HEP's (land and aquatic as appropriate) for continued management of condition  Baseline:  Goal status: INITIAL  2.  Pt to meet stated Foto Goal of 57% Baseline:Risk adjusted 47% Goal status: INITIAL  3.  Pt will improve on 5 X STS test to <or= 15s to demonstrate improving functional lower extremity strength, transitional movements, and balance  Baseline: 25.09 from bench with ue use Goal status: INITIAL  4.  Pt will improve on Berg balance test to >/= 40/56 to demonstrate a decrease in fall risk.  Baseline: TBA  Goal status: INITIAL  5.  Pt will report no falls Baseline: High fall risk Goal status: INITIAL  6.  Pt will improve strength in all areas with deficits by at least 1 full grade to demonstrate improved overall physical function  Baseline: see chart Goal status: INITIAL  PLAN:  PT FREQUENCY: 1-2x/week  PT DURATION: 10 weeks 16 visits Pt to be delayed a few weeks for hand surgery  PLANNED INTERVENTIONS: Therapeutic exercises, Therapeutic activity, Neuromuscular re-education, Balance  training, Gait training, Patient/Family education, Self Care, Joint mobilization, Stair training, Orthotic/Fit training, DME instructions, Aquatic Therapy, Dry Needling, Electrical stimulation, Cryotherapy, Moist heat, Taping, Ionotophoresis 4mg /ml Dexamethasone, Manual therapy, and Re-evaluation.  PLAN FOR NEXT SESSION: re-assess if >4 weeks between eval and return to therapy. Aquatics for strengthening LE and core, balance, proprioception and gait retraining.  Mayer Camel, PTA 01/09/23 3:55 PM [facility] [address] [place], [place], [address] Phone: [phone]   Fax:  [phone]   Referring diagnosis? Lumbar stenosis Treatment diagnosis? (if different than referring diagnosis) same What was this (referring dx) caused by? []  Surgery []  Fall [x]  Ongoing issue []  Arthritis []  Other: ____________  Laterality: []  Rt []  Lt [x]  Both  Check all possible CPT codes:  *CHOOSE 10 OR LESS*    [x]  97110 (Therapeutic Exercise)  []   78295 (SLP Treatment)   [id number] (Neuro Re-ed)    92526 (Swallowing Treatment)    [phone] (Gait Training)    [phone] (Cognitive Training, 1st 15 minutes)  97140 (Manual Therapy)    46962 (Cognitive Training, each add'l 15 minutes)   97164 (Re-evaluation)                               Other, List CPT Code ____________   97530 (Therapeutic Activities)      97535 (Self Care)    All codes above (97110 - 97535)   95284 (Mechanical Traction)   97014 (E-stim Unattended)   97032 (E-stim manual)   97033 (Ionto)   97035 (Ultrasound)  97750 (Physical Performance Training)  [id number] (Aquatic Therapy)  97016 (Vasopneumatic Device)  [id number] (Paraffin)  97034 (Contrast Bath)  97597 (Wound Care 1st 20 sq cm)  97598 (Wound Care each add'l 20 sq cm)  97760 (Orthotic Fabrication, Fitting, Training Initial)  [id number] (Prosthetic Management and Training  Initial)  [id number] (Orthotic or Prosthetic Training/ Modification Subsequent)

## 2023-01-12 DIAGNOSIS — Z124 Encounter for screening for malignant neoplasm of cervix: Secondary | ICD-10-CM | POA: Diagnosis not present

## 2023-01-12 DIAGNOSIS — Z01419 Encounter for gynecological examination (general) (routine) without abnormal findings: Secondary | ICD-10-CM | POA: Diagnosis not present

## 2023-01-12 DIAGNOSIS — Z6823 Body mass index (BMI) 23.0-23.9, adult: Secondary | ICD-10-CM | POA: Diagnosis not present

## 2023-01-12 DIAGNOSIS — Z1231 Encounter for screening mammogram for malignant neoplasm of breast: Secondary | ICD-10-CM | POA: Diagnosis not present

## 2023-01-12 DIAGNOSIS — Z01411 Encounter for gynecological examination (general) (routine) with abnormal findings: Secondary | ICD-10-CM | POA: Diagnosis not present

## 2023-01-12 DIAGNOSIS — M810 Age-related osteoporosis without current pathological fracture: Secondary | ICD-10-CM | POA: Diagnosis not present

## 2023-01-14 ENCOUNTER — Ambulatory Visit (HOSPITAL_BASED_OUTPATIENT_CLINIC_OR_DEPARTMENT_OTHER): Payer: Medicare PPO | Admitting: Physical Therapy

## 2023-01-14 ENCOUNTER — Encounter (HOSPITAL_BASED_OUTPATIENT_CLINIC_OR_DEPARTMENT_OTHER): Payer: Self-pay | Admitting: Physical Therapy

## 2023-01-14 DIAGNOSIS — M6281 Muscle weakness (generalized): Secondary | ICD-10-CM | POA: Diagnosis not present

## 2023-01-14 DIAGNOSIS — R2689 Other abnormalities of gait and mobility: Secondary | ICD-10-CM | POA: Diagnosis not present

## 2023-01-14 DIAGNOSIS — R2681 Unsteadiness on feet: Secondary | ICD-10-CM

## 2023-01-14 NOTE — Therapy (Signed)
OUTPATIENT PHYSICAL THERAPY THORACOLUMBAR TREATMENT   Patient Name: Carol Woods MRN: [id number] DOB:10-Aug-1934, 89 y.o., female Today's Date: 01/14/2023  END OF SESSION:  PT End of Session - 01/14/23 1538     Visit Number 3    Number of Visits 16    Date for PT Re-Evaluation 02/24/23    Authorization Type humana mcr    PT Start Time 1520   Pt arrives late for appointment   PT Stop Time 1545    PT Time Calculation (min) 25 min    Activity Tolerance Patient tolerated treatment well    Behavior During Therapy [facility] for tasks assessed/performed              Past Medical History:  Diagnosis Date   Arthritis    "back; shoulders" (08/31/2014)   Atypical chest pain 11/21/2019   Cataracts, both eyes    Essential hypertension 07/13/2018   GERD (gastroesophageal reflux disease)    OTC, changed diet   High cholesterol    Hypertension    Hypothyroidism    Past Surgical History:  Procedure Laterality Date   ANTERIOR CERVICAL DECOMP/DISCECTOMY FUSION  02/2010   Dr Danielle Dess   BACK SURGERY     COLONOSCOPY W/ POLYPECTOMY     DILATION AND CURETTAGE OF UTERUS     POSTERIOR LUMBAR FUSION  04/2010   Dr Danielle Dess   TONSILLECTOMY     age 48   TOTAL SHOULDER ARTHROPLASTY Right 08/31/2014   TOTAL SHOULDER ARTHROPLASTY Right 08/31/2014   Procedure: TOTAL SHOULDER ARTHROPLASTY;  Surgeon: Mable Paris, MD;  Location: [facility] OR;  Service: Orthopedics;  Laterality: Right;  Right total shoulder arthroplasty   Patient Active Problem List   Diagnosis Date Noted   Left foot drop 10/31/2022   Pill dysphagia 07/11/2022   Change in bowel habits 07/11/2022   History of colonic polyps 07/11/2022   GERD (gastroesophageal reflux disease) 11/21/2019   Atypical chest pain 11/21/2019   Essential hypertension 07/13/2018   Thoracic spinal stenosis 03/18/2018   Spinal stenosis of lumbar region 03/18/2018   Gait abnormality 02/04/2018   Paresthesia 02/04/2018   MGUS (monoclonal gammopathy of  unknown significance) 12/02/2017   Status post total shoulder arthroplasty 08/31/2014    PCP: [name], MD   REFERRING PROVIDER: Judi Saa, DO   REFERRING DIAG: [phone] (ICD-10-CM) - Spinal stenosis of lumbar region with neurogenic claudication   Rationale for Evaluation and Treatment: Rehabilitation  THERAPY DIAG:  Muscle weakness (generalized)  Unsteadiness on feet  Other abnormalities of gait and mobility  ONSET DATE: exacerbated last few months  SUBJECTIVE:  SUBJECTIVE STATEMENT: "I'm sorry I'm late.  I messed up the time"  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  I will not be able to get into the pool until it heals.  I am not having any pain today.  It comes and goes without any particular reason. Cervical surgery and Lumbar surgery >10 yrs. Using cane >5 yrs.  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 I feel like I have gotten worse last few months, weaker very afraid of falling.  I live alone with my cat.   PERTINENT HISTORY:  As per Dr Katrinka Blazing 10/31/22: As well as notes that were available from care everywhere and other healthcare systems.  Most recent MRI that I have in the computer system is a thoracic wound in July 2022.  Found to have what appeared to be an T9-T10 disc extrusion with cord flattening severe right foraminal narrowing.  L1-L2 spinal stenosis   X-rays were taken today and do not show any significant concern for loosening of any of the hardware at the L3-L4 and L4-L5 posterior fusion.  Patient does have significant left foot drop. Difficult to tell how long this has been contributing at this time       PAIN:  Are you having pain? Yes: NPRS scale:  current 0/10  worst 6/10 Pain location: legs and feet; arms and hand Pain description: sharp, burning/hot, cold intermittent Aggravating factors: unknown Relieving factors: unknown  PRECAUTIONS: Fall  WEIGHT BEARING RESTRICTIONS: No  FALLS:  Has patient fallen in last 6 months? No  LIVING ENVIRONMENT: Lives with: lives with their family Lives in: House/apartment Stairs: Yes: External: 4 steps; on right going up Has following equipment at home: Single point cane  OCCUPATION: retired  PLOF: Independent  PATIENT GOALS: gain strength  NEXT MD VISIT: 12/22/22  OBJECTIVE:   DIAGNOSTIC FINDINGS:  DG Lumbar Spine 10/31/22 MPRESSION: Postsurgical changes of L2-L5 without evidence of hardware complication.  PATIENT SURVEYS:  FOTO risk adjusted 47% with goal of 57% in 11 visit  SCREENING FOR RED FLAGS: Bowel or bladder incontinence: No Spinal tumors: No Cauda equina syndrome: No Compression fracture: No Abdominal aneurysm: No  COGNITION: Overall cognitive status: Within functional limits for tasks assessed     SENSATION: Neuropathies hands and feet  MUSCLE LENGTH: Hamstrings:  Tba next session   POSTURE: No Significant postural limitations  PALPATION: wfl  LUMBAR ROM:   AROM eval  Flexion   Extension   Right lateral flexion   Left lateral flexion   Right rotation   Left rotation    unable to properly test due to pt inability to stand unsupported  LOWER EXTREMITY ROM:     wfl  LOWER EXTREMITY MMT:    MMT Right eval Left eval  Hip flexion 3+ 3+  Hip extension 3 3  Hip abduction 4 4  Hip adduction 5 5  Hip internal rotation    Hip external rotation    Knee flexion 3+ 3+  Knee extension 3+ 3+  Ankle dorsiflexion 3- 2  Ankle plantarflexion    Ankle inversion    Ankle eversion     (Blank rows = not tested)  LUMBAR SPECIAL TESTS:    FUNCTIONAL TESTS:  5 times sit to stand: 25.09 from bench with ue use Timed up and go (TUG): 17.32 Berg:  TBA next session  GAIT: Distance  walked: 440ft Assistive device utilized: Single point cane Level of assistance: Modified independence Comments: Pt amb with bilateral steppage pattern L>R  TODAY'S TREATMENT:                                                                                                                              Pt seen for aquatic therapy today.  Treatment took place in water 3.5-4.75 ft in depth at the [facility] pool. Temp of water was 91.  Pt entered/exited the pool via stairs using step to pattern cga with bilat hand rail.  *Intro to setting *UE support on wall 3.6 ft: DF; PF; high knee marching *Seated on lift: cycling; add/abd *Standing ue support on wall: side stepping R/L. Cues for step length  Pt requires the buoyancy and hydrostatic pressure of water for support, and to offload joints by unweighting joint load by at least 50 % in navel deep water and by at least 75-80% in chest to neck deep water.  Viscosity of the water is needed for resistance of strengthening. Water current perturbations provides challenge to standing balance requiring increased core activation.     01/09/23  Donned pt with Lt AFO. Therapeutic exercise: At counter with light UE support - side stepping R/L; tandem gait forward /backward ( post LOB- minA to correct);  tandem gait forward and backwards walking (CGA for safety);  squats x 5;  hip abdct/ addct x 10 each; marching  STS with slow eccentric lowering with forward arm reach and cues for hip hinge x 8  Gait:  80 ft with SBA with AFO and SPC in Rt hand, unsteady with horiz head turns  Therapeutic Ex:  NuStep (LE only- without AFO) L4 ->3 x 5 min    PATIENT EDUCATION:  Education details: Discussed eval findings, rehab rationale, aquatic program progression/POC and pools in area. Patient is in agreement  Person educated: Patient Education method: Explanation Education comprehension: verbalized understanding  HOME  EXERCISE PROGRAM: tba  ASSESSMENT:  CLINICAL IMPRESSION:  Pt late for appointment as she got confused with the appointment time.  She is very shaky getting into the pool requiring cga on steps as well as verbal reassurance. She is directed through gentle ROM exercises of LE in standing and sitting. No complaints of any pain. She will require assistance of therapist for full session. She will benefit from skilled physical therapy intervention in aquatic setting to use the properties of water for safety with strengthening and balance retraining.     From Eval: Patient is a 89 y.o. f who was seen today for physical therapy evaluation and treatment for spinal stenosis of lumbar spine. She presents with general weakness throughout extremities and core as well as intermittent paresthesias and numbness in extremities.  She does have a PMHx which includes surgical intervention for cervical decompression/discectomy and laminectomy back in 2011 and post fusion L3-5 in past 10 yrs.  She report she has been losing strength and  becoming increasingly unsteady over the past 3 months.  She does live alone and using cane at all times.  Objective testing demonstrates significant decrease in LE strength with left foot drop, gait is unsteady and her fall risk high. She will be having a mohs procedure on her left hand 4/1 and will be unable to get into pool until released by MD.  Plan to further assess lumbar spine further after return to therapy.  She will benefit from skilled physical therapy intervention in aquatic setting to use the properties of water for safety with strengthening and balance retraining. Will transition to land when approp.  OBJECTIVE IMPAIRMENTS: Abnormal gait, decreased activity tolerance, decreased balance, decreased coordination, decreased endurance, decreased mobility, difficulty walking, decreased strength, decreased safety awareness, impaired perceived functional ability, impaired sensation, and  pain.   ACTIVITY LIMITATIONS: carrying, lifting, bending, sitting, standing, squatting, sleeping, stairs, transfers, bed mobility, and locomotion level  PARTICIPATION LIMITATIONS: meal prep, cleaning, laundry, shopping, community activity, and yard work  PERSONAL FACTORS: Age and Time since onset of injury/illness/exacerbation are also affecting patient's functional outcome.   REHAB POTENTIAL: Fair    CLINICAL DECISION MAKING: Unstable/unpredictable  EVALUATION COMPLEXITY: High   GOALS: Goals reviewed with patient? Yes  SHORT TERM GOALS: Target date: 01/24/23  Pt will tolerate full aquatic sessions consistently without increase in pain and with improving function to demonstrate good toleration and effectiveness of intervention.   Baseline:TBA Goal status: INITIAL  2.  Pt will tolerate stair climbing using alternating pattern ascending and descending 6 steps with use of handrail to demonstrate improving le strength Baseline: TBA  Goal status: INITIAL  3.  Pt will improve on Tug test to <or= 13s to demonstrate improvement in lower extremity function, mobility and decreased fall risk.  Baseline: 17.32 Goal status: INITIAL  4.  Pt will be compliant with use of AFO to improve gait Baseline: being fabricated Goal status: INITIAL    LONG TERM GOALS: Target date: 6/54/24  Pt will be indep with final HEP's (land and aquatic as appropriate) for continued management of condition  Baseline:  Goal status: INITIAL  2.  Pt to meet stated Foto Goal of 57% Baseline:Risk adjusted 47% Goal status: INITIAL  3.  Pt will improve on 5 X STS test to <or= 15s to demonstrate improving functional lower extremity strength, transitional movements, and balance  Baseline: 25.09 from bench with ue use Goal status: INITIAL  4.  Pt will improve on Berg balance test to >/= 40/56 to demonstrate a decrease in fall risk.  Baseline: TBA  Goal status: INITIAL  5.  Pt will report no falls Baseline:  High fall risk Goal status: INITIAL  6.  Pt will improve strength in all areas with deficits by at least 1 full grade to demonstrate improved overall physical function  Baseline: see chart Goal status: INITIAL  PLAN:  PT FREQUENCY: 1-2x/week  PT DURATION: 10 weeks 16 visits Pt to be delayed a few weeks for hand surgery  PLANNED INTERVENTIONS: Therapeutic exercises, Therapeutic activity, Neuromuscular re-education, Balance training, Gait training, Patient/Family education, Self Care, Joint mobilization, Stair training, Orthotic/Fit training, DME instructions, Aquatic Therapy, Dry Needling, Electrical stimulation, Cryotherapy, Moist heat, Taping, Ionotophoresis /ml Dexamethasone, Manual therapy, and Re-evaluation.  PLAN FOR NEXT SESSION: re-assess if >4 weeks between eval and return to therapy. Aquatics for strengthening LE and core, balance, proprioception and gait retraining.  Corrie Dandy [place]) Lyrika Souders MPT 01/14/23 4:57 PM [facility] [address] [place], [place], [address]  Phone: [phone]   Fax:  [phone]   Referring diagnosis? Lumbar stenosis Treatment diagnosis? (if different than referring diagnosis) same What was this (referring dx) caused by? []  Surgery []  Fall [x]  Ongoing issue []  Arthritis []  Other: ____________  Laterality: []  Rt []  Lt [x]  Both  Check all possible CPT codes:  *CHOOSE 10 OR LESS*    [x]  97110 (Therapeutic Exercise)  []  92507 (SLP Treatment)  [x]  29562 (Neuro Re-ed)   []  92526 (Swallowing Treatment)   [x]  97116 (Gait Training)   []  [id number] (Cognitive Training, 1st 15 minutes) [x]  97140 (Manual Therapy)   []  97130 (Cognitive Training, each add'l 15 minutes)  [x]  97164 (Re-evaluation)                              []  Other, List CPT Code ____________  [x]  97530 (Therapeutic Activities)     [x]  97535 (Self Care)   [x]  All codes above (97110 - 97535)  []  97012 (Mechanical Traction)  [x]  97014  (E-stim Unattended)  [x]  97032 (E-stim manual)  [x]  97033 (Ionto)  []  97035 (Ultrasound) [x]  97750 (Physical Performance Training) [x]  [id number] (Aquatic Therapy) []  97016 (Vasopneumatic Device) []  [id number] (Paraffin) []  97034 (Contrast Bath) []  97597 (Wound Care 1st 20 sq cm) []  97598 (Wound Care each add'l 20 sq cm) []  97760 (Orthotic Fabrication, Fitting, Training Initial) []  [id number] (Prosthetic Management and Training Initial) [x]  [id number] (Orthotic or Prosthetic Training/ Modification Subsequent)

## 2023-01-20 ENCOUNTER — Encounter (HOSPITAL_BASED_OUTPATIENT_CLINIC_OR_DEPARTMENT_OTHER): Payer: Self-pay | Admitting: Physical Therapy

## 2023-01-20 ENCOUNTER — Ambulatory Visit (HOSPITAL_BASED_OUTPATIENT_CLINIC_OR_DEPARTMENT_OTHER): Payer: Medicare PPO | Admitting: Physical Therapy

## 2023-01-20 DIAGNOSIS — R2681 Unsteadiness on feet: Secondary | ICD-10-CM | POA: Diagnosis not present

## 2023-01-20 DIAGNOSIS — M6281 Muscle weakness (generalized): Secondary | ICD-10-CM | POA: Diagnosis not present

## 2023-01-20 DIAGNOSIS — R2689 Other abnormalities of gait and mobility: Secondary | ICD-10-CM

## 2023-01-20 NOTE — Therapy (Signed)
OUTPATIENT PHYSICAL THERAPY THORACOLUMBAR TREATMENT   Patient Name: Carol Woods MRN: [id number] DOB:[birth year], 89 y.o., female Today's Date: 01/20/2023  END OF SESSION:  PT End of Session - 01/20/23 1316     Visit Number 4    Number of Visits 16    Date for PT Re-Evaluation 02/24/23    Authorization Type human[employer] mcr    PT Start Time 1015    PT Stop Time 1100    PT Time Calculation (min) 45 min    Activity Tolerance Patient tolerated treatment well    Behavior During Therapy WFL for tasks assessed/performed               Past Medical History:  Diagnosis Date   Arthritis    "back; shoulders" (08/31/2014)   Atypical chest pain 11/21/2019   Cataracts, both eyes    Essential hypertension 07/13/2018   GERD (gastroesophageal reflux disease)    OTC, changed diet   High cholesterol    Hypertension    Hypothyroidism    Past Surgical History:  Procedure Laterality Date   ANTERIOR CERVICAL DECOMP/DISCECTOMY FUSION  02/2010   Dr Danielle Dess   BACK SURGERY     COLONOSCOPY W/ POLYPECTOMY     DILATION AND CURETTAGE OF UTERUS     POSTERIOR LUMBAR FUSION  04/2010   Dr Danielle Dess   TONSILLECTOMY     age 89   TOTAL SHOULDER ARTHROPLASTY Right 08/31/2014   TOTAL SHOULDER ARTHROPLASTY Right 08/31/2014   Procedure: TOTAL SHOULDER ARTHROPLASTY;  Surgeon: Mable Paris, MD;  Location: [facility] OR;  Service: Orthopedics;  Laterality: Right;  Right total shoulder arthroplasty   Patient Active Problem List   Diagnosis Date Noted   Left foot drop 10/31/2022   Pill dysphagia 07/11/2022   Change in bowel habits 07/11/2022   History of colonic polyps 07/11/2022   GERD (gastroesophageal reflux disease) 11/21/2019   Atypical chest pain 11/21/2019   Essential hypertension 07/13/2018   Thoracic spinal stenosis 03/18/2018   Spinal stenosis of lumbar region 03/18/2018   Gait abnormality 02/04/2018   Paresthesia 02/04/2018   MGUS (monoclonal gammopathy of unknown significance) 12/02/2017    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   She is not pleased with AFO - having difficulty getting it in her shoe and difficulty wearing it; plans to return to [facility] for corrections/modifications.     PERTINENT HISTORY:  As per Dr Katrinka Blazing 10/31/22: As well as notes that were available from care everywhere and other healthcare systems.  Most recent MRI that I have in the computer system is a thoracic wound in July 2022.  Found to have what appeared to be an T9-T10 disc extrusion with cord flattening severe right foraminal narrowing.  L1-L2 spinal stenosis   X-rays were taken today and do not show any significant concern for loosening of any of the hardware at the L3-L4 and L4-L5 posterior fusion.  Patient does have significant left foot drop. Difficult to tell how long this has been contributing at this time       PAIN:  Are you having pain? No: NPRS scale: current 0/10  worst 6/10 Pain location: legs and feet; arms and hand Pain description: sharp, burning/hot, cold intermittent Aggravating factors: unknown Relieving factors: unknown  PRECAUTIONS: Fall  WEIGHT BEARING RESTRICTIONS: No  FALLS:  Has patient fallen in last 6 months? No  LIVING ENVIRONMENT: Lives with: lives with their family Lives in:  House/apartment Stairs: Yes: External: 4 steps; on right going up Has following equipment at home: Single point cane  OCCUPATION: retired  PLOF: Independent  PATIENT GOALS: gain strength  NEXT MD VISIT: 12/22/22  OBJECTIVE:   DIAGNOSTIC FINDINGS:  DG Lumbar Spine 10/31/22 MPRESSION: Postsurgical changes of L2-L5 without evidence of hardware complication.  PATIENT SURVEYS:  FOTO risk adjusted 47% with goal of 57% in 11 visit  SCREENING FOR RED FLAGS: Bowel or bladder incontinence: No Spinal tumors: No Cauda equina syndrome: No Compression fracture: No Abdominal aneurysm: No  COGNITION: Overall cognitive status: Within functional limits for tasks assessed     SENSATION: Neuropathies hands and feet  MUSCLE LENGTH: Hamstrings:  Tba next session   POSTURE: No Significant postural limitations  PALPATION: wfl  LUMBAR ROM:   AROM eval  Flexion   Extension   Right lateral flexion   Left lateral flexion   Right rotation   Left rotation    unable to properly test due to pt inability to stand unsupported  LOWER EXTREMITY ROM:     wfl  LOWER EXTREMITY MMT:    MMT Right eval Left eval  Hip flexion 3+ 3+  Hip extension 3 3  Hip abduction 4 4  Hip adduction 5 5  Hip internal rotation    Hip external rotation    Knee flexion 3+ 3+  Knee extension 3+ 3+  Ankle dorsiflexion 3- 2  Ankle plantarflexion    Ankle inversion    Ankle eversion     (Blank rows = not tested)  LUMBAR SPECIAL TESTS:    FUNCTIONAL TESTS:  5 times sit to stand: 25.09 from bench with ue use Timed up and go (TUG): 17.32 Berg: TBA next session  GAIT: Distance walked: 441ft Assistive device utilized: Single point cane Level of assistance: Modified independence Comments: Pt amb with bilateral steppage pattern L>R  TODAY'S TREATMENT:  01/20/23  Pt seen for  aquatic therapy today.  Treatment took place in water 3.5-4.75 ft in depth at the [facility] pool. Temp of water was 91.  Pt entered/exited the pool via stairs using step to pattern with bilat hand rail. Therapist in water with CGA/SBA due to pt's fear.   *UE on barbell/ noodle/ yellow hand floats: walking forward/ backward multiple laps * UE on yellow hand floats:  side stepping; tandem gait backwards (CGA);  forward marching *Seated on bench in water: cycling; long sitting hip add/abd;  * STS from bench in water with feet on blue step, UE on barbell and CGA/min A to steady x 10 * return to walking forward with UE on barbell   Pt requires the buoyancy and hydrostatic pressure of water for support, and to offload joints by unweighting joint load by at least 50 % in navel deep water and by at least 75-80% in chest to neck deep water.  Viscosity of the water is needed for resistance of strengthening. Water current perturbations provides challenge to standing balance requiring increased core activation.    PATIENT EDUCATION:  Education details: Discussed eval findings, rehab rationale, aquatic program progression/POC and pools in area. Patient is in agreement  Person educated: Patient Education method: Explanation Education comprehension: verbalized understanding  HOME EXERCISE PROGRAM: tba  ASSESSMENT:  CLINICAL IMPRESSION:  Pt verbalized fear of being in water, even though she took swim lessons at 89 y/o.  Requires therapist in water with her until her confidence improves.  She was unsteady and guarded with gait; became more relaxed with SBA and repetition, while holding onto floatation device.  Requires CGA to steady with STS.  She will benefit from skilled physical therapy intervention in aquatic setting to use the properties of water for safety with strengthening and balance retraining.     From Eval: Patient is a 89 y.o. f who was seen today for physical therapy evaluation  and treatment for spinal stenosis of lumbar spine. She presents with general weakness throughout extremities and core as well as intermittent paresthesias and numbness in extremities.  She does have a PMHx which includes surgical intervention for cervical decompression/discectomy and laminectomy back in 2011 and post fusion L3-5 in past 10 yrs.  She report she has been losing strength and becoming increasingly unsteady over the past 3 months.  She does live alone and using cane at all times.  Objective testing demonstrates significant decrease in LE strength with left foot drop, gait is unsteady and her fall risk high. She will be having a mohs procedure on her left hand 4/1 and will be unable to get into pool until released by MD.  Plan to further assess lumbar spine further after return to therapy.  She will benefit from skilled physical therapy intervention in aquatic setting to use the properties of water for safety with strengthening and balance retraining. Will transition to land when approp.  OBJECTIVE IMPAIRMENTS: Abnormal gait, decreased activity tolerance, decreased balance, decreased coordination, decreased endurance, decreased mobility, difficulty walking, decreased strength, decreased safety awareness, impaired perceived functional ability, impaired sensation, and pain.   ACTIVITY LIMITATIONS: carrying, lifting, bending, sitting, standing, squatting, sleeping, stairs, transfers, bed mobility, and locomotion level  PARTICIPATION LIMITATIONS: meal prep, cleaning, laundry, shopping, community activity, and yard work  PERSONAL FACTORS: Age and Time since onset of injury/illness/exacerbation are also affecting patient's functional outcome.   REHAB POTENTIAL: Fair    CLINICAL DECISION MAKING: Unstable/unpredictable  EVALUATION COMPLEXITY: High   GOALS: Goals  reviewed with patient? Yes  SHORT TERM GOALS: Target date: 01/24/23  Pt will tolerate full aquatic sessions consistently without  increase in pain and with improving function to demonstrate good toleration and effectiveness of intervention.   Baseline:TBA Goal status: INITIAL  2.  Pt will tolerate stair climbing using alternating pattern ascending and descending 6 steps with use of handrail to demonstrate improving le strength Baseline: TBA  Goal status: INITIAL  3.  Pt will improve on Tug test to <or= 13s to demonstrate improvement in lower extremity function, mobility and decreased fall risk.  Baseline: 17.32 Goal status: INITIAL  4.  Pt will be compliant with use of AFO to improve gait Baseline: being fabricated Goal status: INITIAL    LONG TERM GOALS: Target date: 6/54/24  Pt will be indep with final HEP's (land and aquatic as appropriate) for continued management of condition  Baseline:  Goal status: INITIAL  2.  Pt to meet stated Foto Goal of 57% Baseline:Risk adjusted 47% Goal status: INITIAL  3.  Pt will improve on 5 X STS test to <or= 15s to demonstrate improving functional lower extremity strength, transitional movements, and balance  Baseline: 25.09 from bench with ue use Goal status: INITIAL  4.  Pt will improve on Berg balance test to >/= 40/56 to demonstrate a decrease in fall risk.  Baseline: TBA  Goal status: INITIAL  5.  Pt will report no falls Baseline: High fall risk Goal status: INITIAL  6.  Pt will improve strength in all areas with deficits by at least 1 full grade to demonstrate improved overall physical function  Baseline: see chart Goal status: INITIAL  PLAN:  PT FREQUENCY: 1-2x/week  PT DURATION: 10 weeks 16 visits Pt to be delayed a few weeks for hand surgery  PLANNED INTERVENTIONS: Therapeutic exercises, Therapeutic activity, Neuromuscular re-education, Balance training, Gait training, Patient/Family education, Self Care, Joint mobilization, Stair training, Orthotic/Fit training, DME instructions, Aquatic Therapy, Dry Needling, Electrical stimulation, Cryotherapy,  Moist heat, Taping, Ionotophoresis 4mg /ml Dexamethasone, Manual therapy, and Re-evaluation.  PLAN FOR NEXT SESSION: re-assess if >4 weeks between eval and return to therapy. Aquatics for strengthening LE and core, balance, proprioception and gait retraining.  Mayer Camel, PTA 01/20/23 1:16 PM [facility] [address] [place], [place], [address] Phone: [phone]   Fax:  [phone]    Referring diagnosis? Lumbar stenosis Treatment diagnosis? (if different than referring diagnosis) same What was this (referring dx) caused by? []  Surgery []  Fall [x]  Ongoing issue []  Arthritis []  Other: ____________  Laterality: []  Rt []  Lt [x]  Both  Check all possible CPT codes:  *CHOOSE 10 OR LESS*    [x]  97110 (Therapeutic Exercise)  []  92507 (SLP Treatment)  [x]  97112 (Neuro Re-ed)   []  92526 (Swallowing Treatment)   [x]  97116 (Gait Training)   []  [id number] (Cognitive Training, 1st 15 minutes) [x]  97140 (Manual Therapy)   []  97130 (Cognitive Training, each add'l 15 minutes)  [x]  97164 (Re-evaluation)                              []  Other, List CPT Code ____________  [x]  97530 (Therapeutic Activities)     [x]  97535 (Self Care)   [x]  All codes above (97110 - 97535)  []  97012 (Mechanical Traction)  [x]  97014 (E-stim Unattended)  [x]  97032 (E-stim manual)  [x]  97033 (Ionto)  []  97035 (Ultrasound) [x]  97750 (Physical Performance Training) [x]  10272 (Aquatic Therapy) []   16109 (Vasopneumatic Device) []  [id number] (Paraffin) []  97034 (Contrast Bath) []  60454 (Wound Care 1st 20 sq cm) []  97598 (Wound Care each add'l 20 sq cm) []  97760 (Orthotic Fabrication, Fitting, Training Initial) []  [id number] (Prosthetic Management and Training Initial) [x]  [id number] (Orthotic or Prosthetic Training/ Modification Subsequent)

## 2023-01-22 ENCOUNTER — Ambulatory Visit (HOSPITAL_BASED_OUTPATIENT_CLINIC_OR_DEPARTMENT_OTHER): Payer: Medicare PPO | Attending: Family Medicine | Admitting: Physical Therapy

## 2023-01-22 ENCOUNTER — Encounter (HOSPITAL_BASED_OUTPATIENT_CLINIC_OR_DEPARTMENT_OTHER): Payer: Self-pay | Admitting: Physical Therapy

## 2023-01-22 DIAGNOSIS — R2689 Other abnormalities of gait and mobility: Secondary | ICD-10-CM | POA: Diagnosis not present

## 2023-01-22 DIAGNOSIS — R2681 Unsteadiness on feet: Secondary | ICD-10-CM | POA: Diagnosis not present

## 2023-01-22 DIAGNOSIS — M6281 Muscle weakness (generalized): Secondary | ICD-10-CM | POA: Diagnosis not present

## 2023-01-22 NOTE — Therapy (Signed)
OUTPATIENT PHYSICAL THERAPY THORACOLUMBAR TREATMENT   Patient Name: Carol Woods MRN: [id number] DOB:February 28, 1934, 89 y.o., female Today's Date: 01/22/2023  END OF SESSION:  PT End of Session - 01/22/23 1046     Visit Number 5    Number of Visits 16    Date for PT Re-Evaluation 02/24/23    Authorization Type human[employer] mcr    PT Start Time 1035    PT Stop Time 1115    PT Time Calculation (min) 40 min    Activity Tolerance Patient tolerated treatment well    Behavior During Therapy WFL for tasks assessed/performed               Past Medical History:  Diagnosis Date   Arthritis    "back; shoulders" (08/31/2014)   Atypical chest pain 11/21/2019   Cataracts, both eyes    Essential hypertension 07/13/2018   GERD (gastroesophageal reflux disease)    OTC, changed diet   High cholesterol    Hypertension    Hypothyroidism    Past Surgical History:  Procedure Laterality Date   ANTERIOR CERVICAL DECOMP/DISCECTOMY FUSION  02/2010   Dr Danielle Dess   BACK SURGERY     COLONOSCOPY W/ POLYPECTOMY     DILATION AND CURETTAGE OF UTERUS     POSTERIOR LUMBAR FUSION  04/2010   Dr Danielle Dess   TONSILLECTOMY     age 61   TOTAL SHOULDER ARTHROPLASTY Right 08/31/2014   TOTAL SHOULDER ARTHROPLASTY Right 08/31/2014   Procedure: TOTAL SHOULDER ARTHROPLASTY;  Surgeon: Mable Paris, MD;  Location: [facility] OR;  Service: Orthopedics;  Laterality: Right;  Right total shoulder arthroplasty   Patient Active Problem List   Diagnosis Date Noted   Left foot drop 10/31/2022   Pill dysphagia 07/11/2022   Change in bowel habits 07/11/2022   History of colonic polyps 07/11/2022   GERD (gastroesophageal reflux disease) 11/21/2019   Atypical chest pain 11/21/2019   Essential hypertension 07/13/2018   Thoracic spinal stenosis 03/18/2018   Spinal stenosis of lumbar region 03/18/2018   Gait abnormality 02/04/2018   Paresthesia 02/04/2018   MGUS (monoclonal gammopathy of unknown significance) 12/02/2017    Status post total shoulder arthroplasty 08/31/2014    PCP: [name], MD   REFERRING PROVIDER: Judi Saa, DO   REFERRING DIAG: [phone] (ICD-10-CM) - Spinal stenosis of lumbar region with neurogenic claudication   Rationale for Evaluation and Treatment: Rehabilitation  THERAPY DIAG:  Muscle weakness (generalized)  Unsteadiness on feet  Other abnormalities of gait and mobility  ONSET DATE: exacerbated last few months  SUBJECTIVE:  SUBJECTIVE STATEMENT: Pt reports she has been wearing her AFO some but is not pleased with the fit.  It may be causing right knee pain. Haven't had the time to bring it back to orthotist. Pt also relates stress regarding ailing sister   PERTINENT HISTORY:  As per Dr Katrinka Blazing 10/31/22: As well as notes that were available from care everywhere and other healthcare systems.  Most recent MRI that I have in the computer system is a thoracic wound in July 2022.  Found to have what appeared to be an T9-T10 disc extrusion with cord flattening severe right foraminal narrowing.  L1-L2 spinal stenosis   X-rays were taken today and do not show any significant concern for loosening of any of the hardware at the L3-L4 and L4-L5 posterior fusion.  Patient does have significant left foot drop. Difficult to tell how long this has been contributing at this time       PAIN:  Are you having pain? No: NPRS scale: current 0/10  worst 6/10 Pain location: legs and feet; arms and hand Pain description: sharp, burning/hot, cold intermittent Aggravating factors: unknown Relieving factors: unknown  PRECAUTIONS: Fall  WEIGHT BEARING RESTRICTIONS: No  FALLS:  Has patient fallen in last 6 months? No  LIVING ENVIRONMENT: Lives with: lives with their family Lives in:  House/apartment Stairs: Yes: External: 4 steps; on right going up Has following equipment at home: Single point cane  OCCUPATION: retired  PLOF: Independent  PATIENT GOALS: gain strength  NEXT MD VISIT: 12/22/22  OBJECTIVE:   DIAGNOSTIC FINDINGS:  DG Lumbar Spine 10/31/22 MPRESSION: Postsurgical changes of L2-L5 without evidence of hardware complication.  PATIENT SURVEYS:  FOTO risk adjusted 47% with goal of 57% in 11 visit  SCREENING FOR RED FLAGS: Bowel or bladder incontinence: No Spinal tumors: No Cauda equina syndrome: No Compression fracture: No Abdominal aneurysm: No  COGNITION: Overall cognitive status: Within functional limits for tasks assessed     SENSATION: Neuropathies hands and feet     POSTURE: No Significant postural limitations  PALPATION: wfl  LUMBAR ROM:   AROM eval  Flexion   Extension   Right lateral flexion   Left lateral flexion   Right rotation   Left rotation    unable to properly test due to pt inability to stand unsupported  LOWER EXTREMITY ROM:     wfl  LOWER EXTREMITY MMT:    MMT Right eval Left eval  Hip flexion 3+ 3+  Hip extension 3 3  Hip abduction 4 4  Hip adduction 5 5  Hip internal rotation    Hip external rotation    Knee flexion 3+ 3+  Knee extension 3+ 3+  Ankle dorsiflexion 3- 2  Ankle plantarflexion    Ankle inversion    Ankle eversion     (Blank rows = not tested)  LUMBAR SPECIAL TESTS:    FUNCTIONAL TESTS:  5 times sit to stand: 25.09 from bench with ue use Timed up and go (TUG): 17.32 Berg: TBA next session  GAIT: Distance walked: 469ft Assistive device utilized: Single point cane Level of assistance: Modified independence Comments: Pt amb with bilateral steppage pattern L>R  TODAY'S TREATMENT:  01/22/23  Pt seen for aquatic therapy today.  Treatment took place in  water 3.5-4.75 ft in depth at the [facility] pool. Temp of water was 91.  Pt entered/exited the pool via stairs using step to pattern with bilat hand rail.    *UE on barbell 3.6 ft: walking forward/ backward and side stepping multiple laps distant supervision *seated on lift: LAQ; hip add/bad 2 sets 10 slow then 10 quick *Ue support wall: df; pf; hip abd/abd hip extension; relaxed squats x10-12 *seated on lift:cycling * UE on barbell: return to walking forward x 6 widths  Pt requires the buoyancy and hydrostatic pressure of water for support, and to offload joints by unweighting joint load by at least 50 % in navel deep water and by at least 75-80% in chest to neck deep water.  Viscosity of the water is needed for resistance of strengthening. Water current perturbations provides challenge to standing balance requiring increased core activation.    PATIENT EDUCATION:  Education details: Discussed eval findings, rehab rationale, aquatic program progression/POC and pools in area. Patient is in agreement  Person educated: Patient Education method: Explanation Education comprehension: verbalized understanding  HOME EXERCISE PROGRAM: tba  ASSESSMENT:  CLINICAL IMPRESSION:  Pt completes session with therapist instructing from deck. Improvement from last session requiring PT assistance submerged  In 3.6 ft only.  She has some unsteadiness but self corrects with walking in all directions gaining excellent core engagement.  Goals ongoing     From Eval: Patient is a 89 y.o. f who was seen today for physical therapy evaluation and treatment for spinal stenosis of lumbar spine. She presents with general weakness throughout extremities and core as well as intermittent paresthesias and numbness in extremities.  She does have a PMHx which includes surgical intervention for cervical decompression/discectomy and laminectomy back in 2011 and post fusion L3-5 in past 10 yrs.  She report she has  been losing strength and becoming increasingly unsteady over the past 3 months.  She does live alone and using cane at all times.  Objective testing demonstrates significant decrease in LE strength with left foot drop, gait is unsteady and her fall risk high. She will be having a mohs procedure on her left hand 4/1 and will be unable to get into pool until released by MD.  Plan to further assess lumbar spine further after return to therapy.  She will benefit from skilled physical therapy intervention in aquatic setting to use the properties of water for safety with strengthening and balance retraining. Will transition to land when approp.  OBJECTIVE IMPAIRMENTS: Abnormal gait, decreased activity tolerance, decreased balance, decreased coordination, decreased endurance, decreased mobility, difficulty walking, decreased strength, decreased safety awareness, impaired perceived functional ability, impaired sensation, and pain.   ACTIVITY LIMITATIONS: carrying, lifting, bending, sitting, standing, squatting, sleeping, stairs, transfers, bed mobility, and locomotion level  PARTICIPATION LIMITATIONS: meal prep, cleaning, laundry, shopping, community activity, and yard work  PERSONAL FACTORS: Age and Time since onset of injury/illness/exacerbation are also affecting patient's functional outcome.   REHAB POTENTIAL: Fair    CLINICAL DECISION MAKING: Unstable/unpredictable  EVALUATION COMPLEXITY: High   GOALS: Goals reviewed with patient? Yes  SHORT TERM GOALS: Target date: 01/24/23  Pt will tolerate full aquatic sessions consistently without increase in pain and with improving function to demonstrate good toleration and effectiveness of intervention.   Baseline:TBA Goal status: INITIAL  2.  Pt will tolerate stair climbing using alternating pattern ascending and descending 6 steps with use of handrail to  demonstrate improving le strength Baseline: TBA  Goal status: INITIAL  3.  Pt will improve on  Tug test to <or= 13s to demonstrate improvement in lower extremity function, mobility and decreased fall risk.  Baseline: 17.32 Goal status: INITIAL  4.  Pt will be compliant with use of AFO to improve gait Baseline: being fabricated Goal status: INITIAL    LONG TERM GOALS: Target date: 6/54/24  Pt will be indep with final HEP's (land and aquatic as appropriate) for continued management of condition  Baseline:  Goal status: INITIAL  2.  Pt to meet stated Foto Goal of 57% Baseline:Risk adjusted 47% Goal status: INITIAL  3.  Pt will improve on 5 X STS test to <or= 15s to demonstrate improving functional lower extremity strength, transitional movements, and balance  Baseline: 25.09 from bench with ue use Goal status: INITIAL  4.  Pt will improve on Berg balance test to >/= 40/56 to demonstrate a decrease in fall risk.  Baseline: TBA  Goal status: INITIAL  5.  Pt will report no falls Baseline: High fall risk Goal status: INITIAL  6.  Pt will improve strength in all areas with deficits by at least 1 full grade to demonstrate improved overall physical function  Baseline: see chart Goal status: INITIAL  PLAN:  PT FREQUENCY: 1-2x/week  PT DURATION: 10 weeks 16 visits Pt to be delayed a few weeks for hand surgery  PLANNED INTERVENTIONS: Therapeutic exercises, Therapeutic activity, Neuromuscular re-education, Balance training, Gait training, Patient/Family education, Self Care, Joint mobilization, Stair training, Orthotic/Fit training, DME instructions, Aquatic Therapy, Dry Needling, Electrical stimulation, Cryotherapy, Moist heat, Taping, Ionotophoresis 4mg /ml Dexamethasone, Manual therapy, and Re-evaluation.  PLAN FOR NEXT SESSION: re-assess if >4 weeks between eval and return to therapy. Aquatics for strengthening LE and core, balance, proprioception and gait retraining.  Mayer Camel, PTA 01/22/23 10:47 AM [facility] [address] [place], [place], [address] Phone: [phone]   Fax:  [phone]    Referring diagnosis? Lumbar stenosis Treatment diagnosis? (if different than referring diagnosis) same What was this (referring dx) caused by? []  Surgery []  Fall [x]  Ongoing issue []  Arthritis []  Other: ____________  Laterality: []  Rt []  Lt [x]  Both  Check all possible CPT codes:  *CHOOSE 10 OR LESS*    [x]  97110 (Therapeutic Exercise)  []  92507 (SLP Treatment)  [x]  97112 (Neuro Re-ed)   []  92526 (Swallowing Treatment)   [x]  65784 (Gait Training)   []  [id number] (Cognitive Training, 1st 15 minutes) [x]  97140 (Manual Therapy)   []  97130 (Cognitive Training, each add'l 15 minutes)  [x]  97164 (Re-evaluation)                              []  Other, List CPT Code ____________  [x]  97530 (Therapeutic Activities)     [x]  97535 (Self Care)   [x]  All codes above (97110 - 97535)  []  97012 (Mechanical Traction)  [x]  97014 (E-stim Unattended)  [x]  97032 (E-stim manual)  [x]  97033 (Ionto)  []  97035 (Ultrasound) [x]  97750 (Physical Performance Training) [x]  [id number] (Aquatic Therapy) []  97016 (Vasopneumatic Device) []  69629 (Paraffin) []  97034 (Contrast Bath) []  97597 (Wound Care 1st 20 sq cm) []  97598 (Wound Care each add'l 20 sq cm) []  97760 (Orthotic Fabrication, Fitting, Training Initial) []  [id number] (Prosthetic Management and Training Initial) [x]  [id number] (Orthotic or Prosthetic Training/ Modification Subsequent)

## 2023-01-26 DIAGNOSIS — D225 Melanocytic nevi of trunk: Secondary | ICD-10-CM | POA: Diagnosis not present

## 2023-01-26 DIAGNOSIS — L821 Other seborrheic keratosis: Secondary | ICD-10-CM | POA: Diagnosis not present

## 2023-01-26 DIAGNOSIS — L719 Rosacea, unspecified: Secondary | ICD-10-CM | POA: Diagnosis not present

## 2023-01-26 DIAGNOSIS — Z85828 Personal history of other malignant neoplasm of skin: Secondary | ICD-10-CM | POA: Diagnosis not present

## 2023-01-26 DIAGNOSIS — Z5189 Encounter for other specified aftercare: Secondary | ICD-10-CM | POA: Diagnosis not present

## 2023-01-26 DIAGNOSIS — L814 Other melanin hyperpigmentation: Secondary | ICD-10-CM | POA: Diagnosis not present

## 2023-01-26 DIAGNOSIS — L570 Actinic keratosis: Secondary | ICD-10-CM | POA: Diagnosis not present

## 2023-01-27 ENCOUNTER — Ambulatory Visit (HOSPITAL_BASED_OUTPATIENT_CLINIC_OR_DEPARTMENT_OTHER): Payer: Medicare PPO | Admitting: Physical Therapy

## 2023-01-27 ENCOUNTER — Encounter (HOSPITAL_BASED_OUTPATIENT_CLINIC_OR_DEPARTMENT_OTHER): Payer: Self-pay | Admitting: Physical Therapy

## 2023-01-27 DIAGNOSIS — R2681 Unsteadiness on feet: Secondary | ICD-10-CM | POA: Diagnosis not present

## 2023-01-27 DIAGNOSIS — M6281 Muscle weakness (generalized): Secondary | ICD-10-CM

## 2023-01-27 DIAGNOSIS — R2689 Other abnormalities of gait and mobility: Secondary | ICD-10-CM

## 2023-01-27 NOTE — Therapy (Signed)
OUTPATIENT PHYSICAL THERAPY THORACOLUMBAR TREATMENT   Patient Name: Carol Woods MRN: [id number] DOB:Dec 07, 1933, 89 y.o., female Today's Date: 01/27/2023  END OF SESSION:  PT End of Session - 01/27/23 0952     Visit Number 6    Number of Visits 16    Date for PT Re-Evaluation 02/24/23    Authorization Type humana mcr    PT Start Time 0946    PT Stop Time 1030    PT Time Calculation (min) 44 min    Activity Tolerance Patient tolerated treatment well    Behavior During Therapy [facility] for tasks assessed/performed               Past Medical History:  Diagnosis Date   Arthritis    "back; shoulders" (08/31/2014)   Atypical chest pain 11/21/2019   Cataracts, both eyes    Essential hypertension 07/13/2018   GERD (gastroesophageal reflux disease)    OTC, changed diet   High cholesterol    Hypertension    Hypothyroidism    Past Surgical History:  Procedure Laterality Date   ANTERIOR CERVICAL DECOMP/DISCECTOMY FUSION  02/2010   Dr Danielle Dess   BACK SURGERY     COLONOSCOPY W/ POLYPECTOMY     DILATION AND CURETTAGE OF UTERUS     POSTERIOR LUMBAR FUSION  04/2010   Dr Danielle Dess   TONSILLECTOMY     age 48   TOTAL SHOULDER ARTHROPLASTY Right 08/31/2014   TOTAL SHOULDER ARTHROPLASTY Right 08/31/2014   Procedure: TOTAL SHOULDER ARTHROPLASTY;  Surgeon: Mable Paris, MD;  Location: [facility] OR;  Service: Orthopedics;  Laterality: Right;  Right total shoulder arthroplasty   Patient Active Problem List   Diagnosis Date Noted   Left foot drop 10/31/2022   Pill dysphagia 07/11/2022   Change in bowel habits 07/11/2022   History of colonic polyps 07/11/2022   GERD (gastroesophageal reflux disease) 11/21/2019   Atypical chest pain 11/21/2019   Essential hypertension 07/13/2018   Thoracic spinal stenosis 03/18/2018   Spinal stenosis of lumbar region 03/18/2018   Gait abnormality 02/04/2018   Paresthesia 02/04/2018   MGUS (monoclonal gammopathy of unknown significance) 12/02/2017    Status post total shoulder arthroplasty 08/31/2014    PCP: [name], MD   REFERRING PROVIDER: Judi Saa, DO   REFERRING DIAG: [id number] (ICD-10-CM) - Spinal stenosis of lumbar region with neurogenic claudication   Rationale for Evaluation and Treatment: Rehabilitation  THERAPY DIAG:  Muscle weakness (generalized)  Unsteadiness on feet  Other abnormalities of gait and mobility  ONSET DATE: exacerbated last few months  SUBJECTIVE:  SUBJECTIVE STATEMENT: Pt reports no knee pain.  No soreness after last session.   PERTINENT HISTORY:  As per Dr Katrinka Blazing 10/31/22: As well as notes that were available from care everywhere and other healthcare systems.  Most recent MRI that I have in the computer system is a thoracic wound in July 2022.  Found to have what appeared to be an T9-T10 disc extrusion with cord flattening severe right foraminal narrowing.  L1-L2 spinal stenosis   X-rays were taken today and do not show any significant concern for loosening of any of the hardware at the L3-L4 and L4-L5 posterior fusion.  Patient does have significant left foot drop. Difficult to tell how long this has been contributing at this time       PAIN:  Are you having pain? No: NPRS scale: current 0/10  worst 6/10 Pain location: legs and feet; arms and hand Pain description: sharp, burning/hot, cold intermittent Aggravating factors: unknown Relieving factors: unknown  PRECAUTIONS: Fall  WEIGHT BEARING RESTRICTIONS: No  FALLS:  Has patient fallen in last 6 months? No  LIVING ENVIRONMENT: Lives with: lives with their family Lives in: House/apartment Stairs: Yes: External: 4 steps; on right going up Has following equipment at home: Single point cane  OCCUPATION: retired  PLOF: Independent  PATIENT  GOALS: gain strength  NEXT MD VISIT: 12/22/22  OBJECTIVE:   DIAGNOSTIC FINDINGS:  DG Lumbar Spine 10/31/22 MPRESSION: Postsurgical changes of L2-L5 without evidence of hardware complication.  PATIENT SURVEYS:  FOTO risk adjusted 47% with goal of 57% in 11 visit 01/27/23 53%  SCREENING FOR RED FLAGS: Bowel or bladder incontinence: No Spinal tumors: No Cauda equina syndrome: No Compression fracture: No Abdominal aneurysm: No  COGNITION: Overall cognitive status: Within functional limits for tasks assessed     SENSATION: Neuropathies hands and feet     POSTURE: No Significant postural limitations  PALPATION: wfl  LUMBAR ROM:   AROM eval  Flexion   Extension   Right lateral flexion   Left lateral flexion   Right rotation   Left rotation    unable to properly test due to pt inability to stand unsupported  LOWER EXTREMITY ROM:     wfl  LOWER EXTREMITY MMT:    MMT Right eval Left eval  Hip flexion 3+ 3+  Hip extension 3 3  Hip abduction 4 4  Hip adduction 5 5  Hip internal rotation    Hip external rotation    Knee flexion 3+ 3+  Knee extension 3+ 3+  Ankle dorsiflexion 3- 2  Ankle plantarflexion    Ankle inversion    Ankle eversion     (Blank rows = not tested)  LUMBAR SPECIAL TESTS:    FUNCTIONAL TESTS:  5 times sit to stand: 25.09 from bench with ue use Timed up and go (TUG): 17.32 Berg: TBA next session  Completed 5/7 14/56  GAIT: Distance walked: 449ft Assistive device utilized: Single point cane Level of assistance: Modified independence Comments: Pt amb with bilateral steppage pattern L>R  TODAY'S TREATMENT:  01/27/23  Pt seen for aquatic therapy today.  Treatment took place in water 3.5-4.75 ft in depth at the [facility] pool. Temp of water was 91.  Pt entered/exited the pool via stairs using step to  pattern with bilat hand rail.    *UE on barbell 3.6 ft: walking forward/ backward and side stepping multiple laps  *seated on lift: LAQ; hip add/bad 2 sets 10 slow then 10 quick *Ue support wall: df; pf; hip abd/abd hip extension; relaxed squats x10-12 *Berg Balance skills submerged in 3.6 ft: standing feet apart VE; FT; hip hinge; looking behind back *Seated on lift: cycling; LAQ; hip add/abd 2 sets of 20  Pt requires the buoyancy and hydrostatic pressure of water for support, and to offload joints by unweighting joint load by at least 50 % in navel deep water and by at least 75-80% in chest to neck deep water.  Viscosity of the water is needed for resistance of strengthening. Water current perturbations provides challenge to standing balance requiring increased core activation.  Sharlene Motts completed    PATIENT EDUCATION:  Education details: Discussed eval findings, rehab rationale, aquatic program progression/POC and pools in area. Patient is in agreement  Person educated: Patient Education method: Explanation Education comprehension: verbalized understanding  HOME EXERCISE PROGRAM: tba  ASSESSMENT:  CLINICAL IMPRESSION:  Pt completes session with therapist instructing from deck. Completed Berg in water prior to land.  Established baseline as she tolerates well today.  She reports apprehension with most positions submerged but gains confidence with practice. Land based score low 14/56 indicating she is a high fall risk. She does use a cane at all times which improves her confidence and balance.  She is encouraged to look into getting a rollator for safety in and out of home when needing to carry items.  She vu. She continues to demonstrate improved indep and safety in setting. She has had no falls since onset of PT. Foto score improved  Goals ongoing      From Eval: Patient is a 89 y.o. f who was seen today for physical therapy evaluation and treatment for spinal stenosis of lumbar  spine. She presents with general weakness throughout extremities and core as well as intermittent paresthesias and numbness in extremities.  She does have a PMHx which includes surgical intervention for cervical decompression/discectomy and laminectomy back in 2011 and post fusion L3-5 in past 10 yrs.  She report she has been losing strength and becoming increasingly unsteady over the past 3 months.  She does live alone and using cane at all times.  Objective testing demonstrates significant decrease in LE strength with left foot drop, gait is unsteady and her fall risk high. She will be having a mohs procedure on her left hand 4/1 and will be unable to get into pool until released by MD.  Plan to further assess lumbar spine further after return to therapy.  She will benefit from skilled physical therapy intervention in aquatic setting to use the properties of water for safety with strengthening and balance retraining. Will transition to land when approp.  OBJECTIVE IMPAIRMENTS: Abnormal gait, decreased activity tolerance, decreased balance, decreased coordination, decreased endurance, decreased mobility, difficulty walking, decreased strength, decreased safety awareness, impaired perceived functional ability, impaired sensation, and pain.   ACTIVITY LIMITATIONS: carrying, lifting, bending, sitting, standing, squatting, sleeping, stairs, transfers, bed mobility, and locomotion level  PARTICIPATION LIMITATIONS: meal prep, cleaning, laundry, shopping, community activity, and yard work  PERSONAL FACTORS: Age and Time since onset of injury/illness/exacerbation  are also affecting patient's functional outcome.   REHAB POTENTIAL: Fair    CLINICAL DECISION MAKING: Unstable/unpredictable  EVALUATION COMPLEXITY: High   GOALS: Goals reviewed with patient? Yes  SHORT TERM GOALS: Target date: 01/24/23  Pt will tolerate full aquatic sessions consistently without increase in pain and with improving function to  demonstrate good toleration and effectiveness of intervention.   Baseline:TBA Goal status: INITIAL  2.  Pt will tolerate stair climbing using alternating pattern ascending and descending 6 steps with use of handrail to demonstrate improving le strength Baseline: TBA  Goal status: INITIAL  3.  Pt will improve on Tug test to <or= 13s to demonstrate improvement in lower extremity function, mobility and decreased fall risk.  Baseline: 17.32 Goal status: INITIAL  4.  Pt will be compliant with use of AFO to improve gait Baseline: being fabricated Goal status: INITIAL    LONG TERM GOALS: Target date: 6/54/24  Pt will be indep with final HEP's (land and aquatic as appropriate) for continued management of condition  Baseline:  Goal status: INITIAL  2.  Pt to meet stated Foto Goal of 57% Baseline:Risk adjusted 47% Goal status: INITIAL  3.  Pt will improve on 5 X STS test to <or= 15s to demonstrate improving functional lower extremity strength, transitional movements, and balance  Baseline: 25.09 from bench with ue use Goal status: INITIAL  4.  Pt will improve on Berg balance test to >/= 40/56 to demonstrate a decrease in fall risk.  Baseline: TBA 14/56 01/27/23 Goal status: INITIAL  5.  Pt will report no falls Baseline: High fall risk Goal status: INITIAL  6.  Pt will improve strength in all areas with deficits by at least 1 full grade to demonstrate improved overall physical function  Baseline: see chart Goal status: INITIAL  PLAN:  PT FREQUENCY: 1-2x/week  PT DURATION: 10 weeks 16 visits Pt to be delayed a few weeks for hand surgery  PLANNED INTERVENTIONS: Therapeutic exercises, Therapeutic activity, Neuromuscular re-education, Balance training, Gait training, Patient/Family education, Self Care, Joint mobilization, Stair training, Orthotic/Fit training, DME instructions, Aquatic Therapy, Dry Needling, Electrical stimulation, Cryotherapy, Moist heat, Taping, Ionotophoresis  4mg /ml Dexamethasone, Manual therapy, and Re-evaluation.  PLAN FOR NEXT SESSION: re-assess if >4 weeks between eval and return to therapy. Aquatics for strengthening LE and core, balance, proprioception and gait retraining.  Corrie Dandy [place]) Alioune Hodgkin MPT 01/27/23 10:45 AM [facility] [address] [place], [place], [address] Phone: [phone]   Fax:  [phone]    Referring diagnosis? Lumbar stenosis Treatment diagnosis? (if different than referring diagnosis) same What was this (referring dx) caused by? []  Surgery []  Fall [x]  Ongoing issue []  Arthritis []  Other: ____________  Laterality: []  Rt []  Lt [x]  Both  Check all possible CPT codes:  *CHOOSE 10 OR LESS*    [x]  97110 (Therapeutic Exercise)  []  92507 (SLP Treatment)  [x]  97112 (Neuro Re-ed)   []  92526 (Swallowing Treatment)   [x]  97116 (Gait Training)   []  [id number] (Cognitive Training, 1st 15 minutes) [x]  97140 (Manual Therapy)   []  97130 (Cognitive Training, each add'l 15 minutes)  [x]  97164 (Re-evaluation)                              []  Other, List CPT Code ____________  [x]  97530 (Therapeutic Activities)     [x]  97535 (Self Care)   [x]  All codes above (97110 - 97535)  []  97012 (Mechanical Traction)  [  x] 97014 (E-stim Unattended)  [x]  97032 (E-stim manual)  [x]  97033 (Ionto)  []  97035 (Ultrasound) [x]  97750 (Physical Performance Training) [x]  [id number] (Aquatic Therapy) []  97016 (Vasopneumatic Device) []  [id number] (Paraffin) []  97034 (Contrast Bath) []  97597 (Wound Care 1st 20 sq cm) []  97598 (Wound Care each add'l 20 sq cm) []  97760 (Orthotic Fabrication, Fitting, Training Initial) []  [id number] (Prosthetic Management and Training Initial) [x]  [address] (Orthotic or Prosthetic Training/ Modification Subsequent)

## 2023-01-28 DIAGNOSIS — H26491 Other secondary cataract, right eye: Secondary | ICD-10-CM | POA: Diagnosis not present

## 2023-01-29 ENCOUNTER — Encounter (HOSPITAL_BASED_OUTPATIENT_CLINIC_OR_DEPARTMENT_OTHER): Payer: Self-pay | Admitting: Physical Therapy

## 2023-01-29 ENCOUNTER — Ambulatory Visit (HOSPITAL_BASED_OUTPATIENT_CLINIC_OR_DEPARTMENT_OTHER): Payer: Medicare PPO | Admitting: Physical Therapy

## 2023-01-29 ENCOUNTER — Ambulatory Visit (HOSPITAL_BASED_OUTPATIENT_CLINIC_OR_DEPARTMENT_OTHER): Payer: Self-pay | Admitting: Physical Therapy

## 2023-01-29 DIAGNOSIS — R2689 Other abnormalities of gait and mobility: Secondary | ICD-10-CM

## 2023-01-29 DIAGNOSIS — R2681 Unsteadiness on feet: Secondary | ICD-10-CM

## 2023-01-29 DIAGNOSIS — M6281 Muscle weakness (generalized): Secondary | ICD-10-CM | POA: Diagnosis not present

## 2023-01-29 NOTE — Therapy (Signed)
OUTPATIENT PHYSICAL THERAPY THORACOLUMBAR TREATMENT   Patient Name: Carol Woods MRN: [id number] DOB:1934-08-20, 89 y.o., female Today's Date: 01/29/2023  END OF SESSION:  PT End of Session - 01/29/23 0958     Visit Number 7    Number of Visits 16    Date for PT Re-Evaluation 02/24/23    Authorization Type humana mcr    PT Start Time [phone]    PT Stop Time 1025    PT Time Calculation (min) 38 min    Behavior During Therapy [facility] for tasks assessed/performed               Past Medical History:  Diagnosis Date   Arthritis    "back; shoulders" (08/31/2014)   Atypical chest pain 11/21/2019   Cataracts, both eyes    Essential hypertension 07/13/2018   GERD (gastroesophageal reflux disease)    OTC, changed diet   High cholesterol    Hypertension    Hypothyroidism    Past Surgical History:  Procedure Laterality Date   ANTERIOR CERVICAL DECOMP/DISCECTOMY FUSION  02/2010   Dr Danielle Dess   BACK SURGERY     COLONOSCOPY W/ POLYPECTOMY     DILATION AND CURETTAGE OF UTERUS     POSTERIOR LUMBAR FUSION  04/2010   Dr Danielle Dess   TONSILLECTOMY     age 50   TOTAL SHOULDER ARTHROPLASTY Right 08/31/2014   TOTAL SHOULDER ARTHROPLASTY Right 08/31/2014   Procedure: TOTAL SHOULDER ARTHROPLASTY;  Surgeon: Mable Paris, MD;  Location: [facility] OR;  Service: Orthopedics;  Laterality: Right;  Right total shoulder arthroplasty   Patient Active Problem List   Diagnosis Date Noted   Left foot drop 10/31/2022   Pill dysphagia 07/11/2022   Change in bowel habits 07/11/2022   History of colonic polyps 07/11/2022   GERD (gastroesophageal reflux disease) 11/21/2019   Atypical chest pain 11/21/2019   Essential hypertension 07/13/2018   Thoracic spinal stenosis 03/18/2018   Spinal stenosis of lumbar region 03/18/2018   Gait abnormality 02/04/2018   Paresthesia 02/04/2018   MGUS (monoclonal gammopathy of unknown significance) 12/02/2017   Status post total shoulder arthroplasty 08/31/2014     PCP: [name], MD   REFERRING PROVIDER: Judi Saa, DO   REFERRING DIAG: [phone] (ICD-10-CM) - Spinal stenosis of lumbar region with neurogenic claudication   Rationale for Evaluation and Treatment: Rehabilitation  THERAPY DIAG:  Muscle weakness (generalized)  Unsteadiness on feet  Other abnormalities of gait and mobility  ONSET DATE: exacerbated last few months  SUBJECTIVE:  SUBJECTIVE STATEMENT: Pt voices concern that she will always be a fall risk.  She reports that she has been trying to wear her AFO a couple hours a day at home, but it still doesn't fit right.     PERTINENT HISTORY:  As per Dr Katrinka Blazing 10/31/22: As well as notes that were available from care everywhere and other healthcare systems.  Most recent MRI that I have in the computer system is a thoracic wound in July 2022.  Found to have what appeared to be an T9-T10 disc extrusion with cord flattening severe right foraminal narrowing.  L1-L2 spinal stenosis   X-rays were taken today and do not show any significant concern for loosening of any of the hardware at the L3-L4 and L4-L5 posterior fusion.  Patient does have significant left foot drop. Difficult to tell how long this has been contributing at this time       PAIN:  Are you having pain? No: NPRS scale: current 0/10   Pain location:  Pain description:  Aggravating factors: unknown Relieving factors: unknown  PRECAUTIONS: Fall  WEIGHT BEARING RESTRICTIONS: No  FALLS:  Has patient fallen in last 6 months? No  LIVING ENVIRONMENT: Lives with: lives with their family Lives in: House/apartment Stairs: Yes: External: 4 steps; on right going up Has following equipment at home: Single point cane  OCCUPATION: retired  PLOF: Independent  PATIENT GOALS: gain  strength  NEXT MD VISIT: 12/22/22  OBJECTIVE:   DIAGNOSTIC FINDINGS:  DG Lumbar Spine 10/31/22 MPRESSION: Postsurgical changes of L2-L5 without evidence of hardware complication.  PATIENT SURVEYS:  FOTO risk adjusted 47% with goal of 57% in 11 visit 01/27/23 53%  SCREENING FOR RED FLAGS: Bowel or bladder incontinence: No Spinal tumors: No Cauda equina syndrome: No Compression fracture: No Abdominal aneurysm: No  COGNITION: Overall cognitive status: Within functional limits for tasks assessed     SENSATION: Neuropathies hands and feet     POSTURE: No Significant postural limitations  PALPATION: wfl  LUMBAR ROM:   AROM eval  Flexion   Extension   Right lateral flexion   Left lateral flexion   Right rotation   Left rotation    unable to properly test due to pt inability to stand unsupported  LOWER EXTREMITY ROM:     wfl  LOWER EXTREMITY MMT:    MMT Right eval Left eval  Hip flexion 3+ 3+  Hip extension 3 3  Hip abduction 4 4  Hip adduction 5 5  Hip internal rotation    Hip external rotation    Knee flexion 3+ 3+  Knee extension 3+ 3+  Ankle dorsiflexion 3- 2  Ankle plantarflexion    Ankle inversion    Ankle eversion     (Blank rows = not tested)  LUMBAR SPECIAL TESTS:    FUNCTIONAL TESTS:  5 times sit to stand: 25.09 from bench with ue use Timed up and go (TUG): 17.32   5/7:  BERG 14/56  GAIT: Distance walked: 428ft Assistive device utilized: Single point cane Level of assistance: Modified independence Comments: Pt amb with bilateral steppage pattern L>R  TODAY'S TREATMENT:  01/29/23  Pt seen for aquatic therapy today.  Treatment took place in water 3.5-4 ft in depth at the [facility] pool. Temp of water was 91.  Pt entered/exited the pool via stairs using step-to pattern with bilat hand rail.   *UE on  barbell 3.6 ft: walking forward/ backward and side stepping multiple laps ; high knee marching forward * holding wall:  heel raises (almost clearing heels) x 10; hip abdct/ addct x 10; leg swings -hip flexion to toe touch in hip ext x 10 *Berg Balance skills submerged in 3.6 ft: standing feet together/ without support and with 1 finger at wall;  hip hinge with forward reach UE on barbell (buttocks bumps to wall); looking behind back with knees soft * at bench with feet on blue step, UE on barbell:  STS with standing balance when upright x 10   Pt requires the buoyancy and hydrostatic pressure of water for support, and to offload joints by unweighting joint load by at least 50 % in navel deep water and by at least 75-80% in chest to neck deep water.  Viscosity of the water is needed for resistance of strengthening. Water current perturbations provides challenge to standing balance requiring increased core activation.  PATIENT EDUCATION:  Education details: aquatic exercise rationale and modifications  Person educated: Patient Education method: Explanation Education comprehension: verbalized understanding  HOME EXERCISE PROGRAM: tba  ASSESSMENT:  CLINICAL IMPRESSION:  Pt tolerated session well without overt LOB and without increase in pain.  She participates well throughout session and remains motivated to progress towards established goals. Pt has met STG1      From Eval: Patient is a 89 y.o. f who was seen today for physical therapy evaluation and treatment for spinal stenosis of lumbar spine. She presents with general weakness throughout extremities and core as well as intermittent paresthesias and numbness in extremities.  She does have a PMHx which includes surgical intervention for cervical decompression/discectomy and laminectomy back in 2011 and post fusion L3-5 in past 10 yrs.  She report she has been losing strength and becoming increasingly unsteady over the past 3 months.  She  does live alone and using cane at all times.  Objective testing demonstrates significant decrease in LE strength with left foot drop, gait is unsteady and her fall risk high. She will be having a mohs procedure on her left hand 4/1 and will be unable to get into pool until released by MD.  Plan to further assess lumbar spine further after return to therapy.  She will benefit from skilled physical therapy intervention in aquatic setting to use the properties of water for safety with strengthening and balance retraining. Will transition to land when approp.  OBJECTIVE IMPAIRMENTS: Abnormal gait, decreased activity tolerance, decreased balance, decreased coordination, decreased endurance, decreased mobility, difficulty walking, decreased strength, decreased safety awareness, impaired perceived functional ability, impaired sensation, and pain.   ACTIVITY LIMITATIONS: carrying, lifting, bending, sitting, standing, squatting, sleeping, stairs, transfers, bed mobility, and locomotion level  PARTICIPATION LIMITATIONS: meal prep, cleaning, laundry, shopping, community activity, and yard work  PERSONAL FACTORS: Age and Time since onset of injury/illness/exacerbation are also affecting patient's functional outcome.   REHAB POTENTIAL: Fair    CLINICAL DECISION MAKING: Unstable/unpredictable  EVALUATION COMPLEXITY: High   GOALS: Goals reviewed with patient? Yes  SHORT TERM GOALS: Target date: 01/24/23  Pt will tolerate full aquatic sessions consistently without increase in pain and with improving function to demonstrate good toleration and effectiveness of intervention.  Baseline:TBA Goal status: MET - 01/29/23  2.  Pt will tolerate stair climbing using alternating pattern ascending and descending 6 steps with use of handrail to demonstrate improving le strength Baseline: TBA  Goal status: INITIAL  3.  Pt will improve on Tug test to <or= 13s to demonstrate improvement in lower extremity function,  mobility and decreased fall risk.  Baseline: 17.32 Goal status: INITIAL  4.  Pt will be compliant with use of AFO to improve gait Baseline: being fabricated Goal status:PARTIALLY MET - 01/29/23    LONG TERM GOALS: Target date: 6/54/24  Pt will be indep with final HEP's (land and aquatic as appropriate) for continued management of condition  Baseline:  Goal status: INITIAL  2.  Pt to meet stated Foto Goal of 57% Baseline:Risk adjusted 47% Goal status: INITIAL  3.  Pt will improve on 5 X STS test to <or= 15s to demonstrate improving functional lower extremity strength, transitional movements, and balance  Baseline: 25.09 from bench with ue use Goal status: INITIAL  4.  Pt will improve on Berg balance test to >/= 40/56 to demonstrate a decrease in fall risk.  Baseline: TBA 14/56 01/27/23 Goal status: INITIAL  5.  Pt will report no falls Baseline: High fall risk Goal status: INITIAL  6.  Pt will improve strength in all areas with deficits by at least 1 full grade to demonstrate improved overall physical function  Baseline: see chart Goal status: INITIAL  PLAN:  PT FREQUENCY: 1-2x/week  PT DURATION: 10 weeks 16 visits Pt to be delayed a few weeks for hand surgery  PLANNED INTERVENTIONS: Therapeutic exercises, Therapeutic activity, Neuromuscular re-education, Balance training, Gait training, Patient/Family education, Self Care, Joint mobilization, Stair training, Orthotic/Fit training, DME instructions, Aquatic Therapy, Dry Needling, Electrical stimulation, Cryotherapy, Moist heat, Taping, Ionotophoresis 4mg /ml Dexamethasone, Manual therapy, and Re-evaluation.  PLAN FOR NEXT SESSION: re-assess if >4 weeks between eval and return to therapy. Aquatics for strengthening LE and core, balance, proprioception and gait retraining.   Mayer Camel, PTA 01/29/23 1:55 PM [facility] [address] [place], [place],  [address] Phone: [phone]   Fax:  [phone]    Referring diagnosis? Lumbar stenosis Treatment diagnosis? (if different than referring diagnosis) same What was this (referring dx) caused by? []  Surgery []  Fall [x]  Ongoing issue []  Arthritis []  Other: ____________  Laterality: []  Rt []  Lt [x]  Both  Check all possible CPT codes:  *CHOOSE 10 OR LESS*    [x]  97110 (Therapeutic Exercise)  []  92507 (SLP Treatment)  [x]  97112 (Neuro Re-ed)   []  92526 (Swallowing Treatment)   [x]  97116 (Gait Training)   []  [id number] (Cognitive Training, 1st 15 minutes) [x]  97140 (Manual Therapy)   []  97130 (Cognitive Training, each add'l 15 minutes)  [x]  97164 (Re-evaluation)                              []  Other, List CPT Code ____________  [x]  97530 (Therapeutic Activities)     [x]  97535 (Self Care)   [x]  All codes above (97110 - 97535)  []  97012 (Mechanical Traction)  [x]  97014 (E-stim Unattended)  [x]  97032 (E-stim manual)  [x]  97033 (Ionto)  []  95284 (Ultrasound) [x]  97750 (Physical Performance Training) [x]  [id number] (Aquatic Therapy) []  97016 (Vasopneumatic Device) []  [id number] (Paraffin) []  97034 (Contrast Bath) []  97597 (Wound Care 1st 20 sq cm) []  97598 (Wound Care each add'l 20 sq cm) []  97760 (Orthotic Fabrication,  Fitting, Training Initial) []  [id number] (Prosthetic Management and Training Initial) [x]  [id number] (Orthotic or Prosthetic Training/ Modification Subsequent)

## 2023-01-31 ENCOUNTER — Emergency Department (HOSPITAL_COMMUNITY): Payer: Medicare PPO

## 2023-01-31 ENCOUNTER — Other Ambulatory Visit: Payer: Self-pay

## 2023-01-31 ENCOUNTER — Encounter (HOSPITAL_COMMUNITY): Payer: Self-pay

## 2023-01-31 ENCOUNTER — Emergency Department (HOSPITAL_COMMUNITY)
Admission: EM | Admit: 2023-01-31 | Discharge: 2023-01-31 | Disposition: A | Discharge status: Home / Self Care | Payer: Medicare PPO | Attending: Emergency Medicine | Admitting: Emergency Medicine

## 2023-01-31 DIAGNOSIS — R079 Chest pain, unspecified: Secondary | ICD-10-CM | POA: Diagnosis not present

## 2023-01-31 DIAGNOSIS — Y800 Diagnostic and monitoring physical medicine devices associated with adverse incidents: Secondary | ICD-10-CM | POA: Insufficient documentation

## 2023-01-31 DIAGNOSIS — T80818A Extravasation of other vesicant agent, initial encounter: Secondary | ICD-10-CM | POA: Insufficient documentation

## 2023-01-31 DIAGNOSIS — M542 Cervicalgia: Secondary | ICD-10-CM | POA: Insufficient documentation

## 2023-01-31 DIAGNOSIS — I714 Abdominal aortic aneurysm, without rupture, unspecified: Secondary | ICD-10-CM | POA: Insufficient documentation

## 2023-01-31 DIAGNOSIS — R519 Headache, unspecified: Secondary | ICD-10-CM | POA: Diagnosis not present

## 2023-01-31 DIAGNOSIS — R202 Paresthesia of skin: Secondary | ICD-10-CM | POA: Diagnosis not present

## 2023-01-31 DIAGNOSIS — I10 Essential (primary) hypertension: Secondary | ICD-10-CM | POA: Insufficient documentation

## 2023-01-31 DIAGNOSIS — R0789 Other chest pain: Secondary | ICD-10-CM | POA: Insufficient documentation

## 2023-01-31 DIAGNOSIS — Z79899 Other long term (current) drug therapy: Secondary | ICD-10-CM | POA: Insufficient documentation

## 2023-01-31 DIAGNOSIS — R109 Unspecified abdominal pain: Secondary | ICD-10-CM | POA: Diagnosis not present

## 2023-01-31 DIAGNOSIS — Y92410 Unspecified street and highway as the place of occurrence of the external cause: Secondary | ICD-10-CM | POA: Diagnosis not present

## 2023-01-31 LAB — CBC WITH DIFFERENTIAL/PLATELET
Abs Immature Granulocytes: 0.08 10*3/uL — ABNORMAL HIGH (ref 0.00–0.07)
Basophils Absolute: 0.1 10*3/uL (ref 0.0–0.1)
Basophils Relative: 1 %
Eosinophils Absolute: 0.1 10*3/uL (ref 0.0–0.5)
Eosinophils Relative: 1 %
HCT: 40.2 % (ref 36.0–46.0)
Hemoglobin: 13.8 g/dL (ref 12.0–15.0)
Immature Granulocytes: 1 %
Lymphocytes Relative: 12 %
Lymphs Abs: 1.1 10*3/uL (ref 0.7–4.0)
MCH: 29.6 pg (ref 26.0–34.0)
MCHC: 34.3 g/dL (ref 30.0–36.0)
MCV: 86.3 fL (ref 80.0–100.0)
Monocytes Absolute: 0.6 10*3/uL (ref 0.1–1.0)
Monocytes Relative: 7 %
Neutro Abs: 7 10*3/uL (ref 1.7–7.7)
Neutrophils Relative %: 78 %
Platelets: 197 10*3/uL (ref 150–400)
RBC: 4.66 MIL/uL (ref 3.87–5.11)
RDW: 12.9 % (ref 11.5–15.5)
WBC: 8.8 10*3/uL (ref 4.0–10.5)
nRBC: 0 % (ref 0.0–0.2)

## 2023-01-31 LAB — COMPREHENSIVE METABOLIC PANEL
ALT: 21 U/L (ref 0–44)
AST: 27 U/L (ref 15–41)
Albumin: 4.4 g/dL (ref 3.5–5.0)
Alkaline Phosphatase: 51 U/L (ref 38–126)
Anion gap: 9 (ref 5–15)
BUN: 12 mg/dL (ref 8–23)
CO2: 22 mmol/L (ref 22–32)
Calcium: 8.8 mg/dL — ABNORMAL LOW (ref 8.9–10.3)
Chloride: 101 mmol/L (ref 98–111)
Creatinine, Ser: 0.57 mg/dL (ref 0.44–1.00)
GFR, Estimated: >60 mL/min (ref >60)
Glucose, Bld: 102 mg/dL — ABNORMAL HIGH (ref 70–99)
Potassium: 3.2 mmol/L — ABNORMAL LOW (ref 3.5–5.1)
Sodium: 132 mmol/L — ABNORMAL LOW (ref 135–145)
Total Bilirubin: 1 mg/dL (ref 0.3–1.2)
Total Protein: 7.9 g/dL (ref 6.5–8.1)

## 2023-01-31 LAB — TROPONIN I (HIGH SENSITIVITY): Troponin I (High Sensitivity): 9 ng/L (ref <18)

## 2023-01-31 MED ORDER — LIDOCAINE 5 % EX PTCH: 1.0000 | MEDICATED_PATCH | CUTANEOUS | 0 refills | Status: DC

## 2023-01-31 MED ORDER — IOHEXOL 300 MG/ML  SOLN
75.0000 mL | Freq: Once | INTRAMUSCULAR | Status: AC | PRN
  Administered 2023-01-31: 75 mL via INTRAVENOUS

## 2023-01-31 MED ORDER — IOHEXOL 300 MG/ML  SOLN: 100.0000 mL | Freq: Once | INTRAMUSCULAR | Status: DC | PRN
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
## 2023-01-31 NOTE — Discharge Instructions (Signed)
It was a pleasure taking care of you here in the emergency department today  May take Tylenol or ibuprofen as needed for pain.  Also written for lidocaine patches.  This is the area where he had pain.  12 hours with patch on, 12 hours off before placing additional patch.  As discussed in the room the contrast dye extravasated which means it leaked into your skin.  Use ice then heat to the area.  Please keep close watch on the area.  If you develop severe redness, pain, black tinge to skin please seek reevaluation the emergency department

## 2023-01-31 NOTE — ED Notes (Signed)
Iv is in place for CT w/contrast
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
## 2023-02-03 ENCOUNTER — Ambulatory Visit (HOSPITAL_BASED_OUTPATIENT_CLINIC_OR_DEPARTMENT_OTHER): Payer: Medicare PPO | Admitting: Physical Therapy
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
## 2023-02-05 ENCOUNTER — Ambulatory Visit (HOSPITAL_BASED_OUTPATIENT_CLINIC_OR_DEPARTMENT_OTHER): Payer: Medicare PPO | Admitting: Physical Therapy

## 2023-02-05 ENCOUNTER — Ambulatory Visit (INDEPENDENT_AMBULATORY_CARE_PROVIDER_SITE_OTHER): Payer: Medicare PPO | Admitting: Family Medicine

## 2023-02-05 VITALS — BP 132/68 | HR 78 | Ht 60.0 in

## 2023-02-05 DIAGNOSIS — S134XXA Sprain of ligaments of cervical spine, initial encounter: Secondary | ICD-10-CM | POA: Diagnosis not present

## 2023-02-05 DIAGNOSIS — M4804 Spinal stenosis, thoracic region: Secondary | ICD-10-CM

## 2023-02-05 DIAGNOSIS — S20219A Contusion of unspecified front wall of thorax, initial encounter: Secondary | ICD-10-CM | POA: Diagnosis not present

## 2023-02-05 DIAGNOSIS — M25511 Pain in right shoulder: Secondary | ICD-10-CM

## 2023-02-05 MED ORDER — TIZANIDINE HCL 2 MG PO TABS
2.0000 mg | ORAL_TABLET | Freq: Every day | ORAL | 0 refills | Status: DC
Start: 2023-02-05 — End: 2023-04-23

## 2023-02-05 NOTE — Patient Instructions (Addendum)
Arnica lotion 10 deep breaths every hour while awake PT referral Zanaflex 2mg  start with half at night See you again when schedule

## 2023-02-05 NOTE — Assessment & Plan Note (Signed)
Significant chest contusion noted.  I do believe that there is a costochondral injury that is noted.  Discussed with patient about home exercises, bracing ribs with coughing and certain activities to potentially avoid.  We discussed topical anti-inflammatories such as Arnica.  We discussed taking deep breaths.  Will monitor but patient has good range of motion of the arms at the moment.  Neck does have limited sidebending but seems to be at patient's baseline.  Follow-up with me again in 2 to 3 weeks we will start formal physical therapy well
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
## 2023-02-10 ENCOUNTER — Ambulatory Visit (HOSPITAL_BASED_OUTPATIENT_CLINIC_OR_DEPARTMENT_OTHER): Payer: Medicare PPO | Admitting: Physical Therapy

## 2023-02-12 ENCOUNTER — Ambulatory Visit (HOSPITAL_BASED_OUTPATIENT_CLINIC_OR_DEPARTMENT_OTHER): Payer: Medicare PPO | Admitting: Physical Therapy

## 2023-02-13 DIAGNOSIS — H353132 Nonexudative age-related macular degeneration, bilateral, intermediate dry stage: Secondary | ICD-10-CM | POA: Diagnosis not present

## 2023-02-13 DIAGNOSIS — H5310 Unspecified subjective visual disturbances: Secondary | ICD-10-CM | POA: Diagnosis not present

## 2023-02-13 DIAGNOSIS — H26491 Other secondary cataract, right eye: Secondary | ICD-10-CM | POA: Diagnosis not present
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
## 2023-02-25 ENCOUNTER — Ambulatory Visit: Payer: Medicare PPO | Admitting: Family Medicine

## 2023-02-25 VITALS — BP 124/64 | HR 74 | Ht 60.0 in | Wt 128.0 lb

## 2023-02-25 DIAGNOSIS — R269 Unspecified abnormalities of gait and mobility: Secondary | ICD-10-CM | POA: Diagnosis not present

## 2023-02-25 DIAGNOSIS — M25511 Pain in right shoulder: Secondary | ICD-10-CM

## 2023-02-25 DIAGNOSIS — R0789 Other chest pain: Secondary | ICD-10-CM

## 2023-02-25 DIAGNOSIS — R2689 Other abnormalities of gait and mobility: Secondary | ICD-10-CM

## 2023-02-25 DIAGNOSIS — S20219A Contusion of unspecified front wall of thorax, initial encounter: Secondary | ICD-10-CM | POA: Diagnosis not present

## 2023-02-25 NOTE — Assessment & Plan Note (Signed)
Will send patient to vestibular and neuro in case this is potentially playing a role.  Think patient will do well with conservative therapy otherwise.  Follow-up again in 6 to 8 weeks.  Worsening symptoms to seek medical attention

## 2023-02-25 NOTE — Patient Instructions (Addendum)
[name]  Arnica lotion See me again in 7 weeks

## 2023-02-25 NOTE — Assessment & Plan Note (Signed)
Patient will start doing the exercises.  Discussed which activities to do and which ones to avoid.  Continue to work on home exercises and will start with formal physical therapy.  Patient is having some mild difficulty with balance and will send to vestibular neuro as well.  Follow-up again in 6 to 8 weeks

## 2023-03-02 ENCOUNTER — Telehealth: Payer: Self-pay | Admitting: Family Medicine

## 2023-03-02 NOTE — Telephone Encounter (Signed)
Patient called stating that she was referred to [facility]. They called her today to schedule but she missed the call. She called them right back and has left multiple messages with no call back. She was a little concerned about that and setting up therapy if they not too busy or not available.  She asked if there was another location that Dr Katrinka Blazing would recommend or if they would be the best? She is fine waiting but wanted to make sure this was the best fit for her.  Please advise.

## 2023-03-03 NOTE — Addendum Note (Signed)
Addended by: Ethlyn Daniels on: 03/03/2023 11:41 AM   Modules accepted: Orders

## 2023-03-06 ENCOUNTER — Ambulatory Visit: Payer: Medicare PPO | Admitting: Nurse Practitioner

## 2023-03-06 ENCOUNTER — Telehealth: Payer: Self-pay

## 2023-03-06 VITALS — BP 134/66 | HR 80 | Temp 98.4°F | Ht 60.0 in | Wt 126.4 lb

## 2023-03-06 DIAGNOSIS — I10 Essential (primary) hypertension: Secondary | ICD-10-CM

## 2023-03-06 DIAGNOSIS — E785 Hyperlipidemia, unspecified: Secondary | ICD-10-CM | POA: Diagnosis not present

## 2023-03-06 DIAGNOSIS — M5104 Intervertebral disc disorders with myelopathy, thoracic region: Secondary | ICD-10-CM

## 2023-03-06 DIAGNOSIS — Z9181 History of falling: Secondary | ICD-10-CM | POA: Insufficient documentation

## 2023-03-06 DIAGNOSIS — E876 Hypokalemia: Secondary | ICD-10-CM | POA: Diagnosis not present

## 2023-03-06 DIAGNOSIS — R5383 Other fatigue: Secondary | ICD-10-CM | POA: Insufficient documentation

## 2023-03-06 DIAGNOSIS — R197 Diarrhea, unspecified: Secondary | ICD-10-CM | POA: Insufficient documentation

## 2023-03-06 DIAGNOSIS — R296 Repeated falls: Secondary | ICD-10-CM | POA: Insufficient documentation

## 2023-03-06 DIAGNOSIS — E039 Hypothyroidism, unspecified: Secondary | ICD-10-CM | POA: Diagnosis not present

## 2023-03-06 DIAGNOSIS — R269 Unspecified abnormalities of gait and mobility: Secondary | ICD-10-CM

## 2023-03-06 DIAGNOSIS — M8588 Other specified disorders of bone density and structure, other site: Secondary | ICD-10-CM | POA: Insufficient documentation

## 2023-03-06 DIAGNOSIS — R42 Dizziness and giddiness: Secondary | ICD-10-CM | POA: Insufficient documentation

## 2023-03-06 DIAGNOSIS — R6889 Other general symptoms and signs: Secondary | ICD-10-CM | POA: Insufficient documentation

## 2023-03-06 LAB — VITAMIN D 25 HYDROXY (VIT D DEFICIENCY, FRACTURES): VITD: 35.77 ng/mL (ref 30.00–100.00)

## 2023-03-06 LAB — BASIC METABOLIC PANEL
BUN: 16 mg/dL (ref 6–23)
CO2: 28 mEq/L (ref 19–32)
Calcium: 9.7 mg/dL (ref 8.4–10.5)
Chloride: 102 mEq/L (ref 96–112)
Creatinine, Ser: 0.63 mg/dL (ref 0.40–1.20)
GFR: 80.06 mL/min (ref >60.00)
Glucose, Bld: 96 mg/dL (ref 70–99)
Potassium: 3.9 mEq/L (ref 3.5–5.1)
Sodium: 139 mEq/L (ref 135–145)

## 2023-03-06 LAB — TSH: TSH: 1.99 u[IU]/mL (ref 0.35–5.50)

## 2023-03-06 LAB — LDL CHOLESTEROL, DIRECT: Direct LDL: 84 mg/dL

## 2023-03-06 LAB — LIPID PANEL
Cholesterol: 175 mg/dL (ref 0–200)
HDL: 34.4 mg/dL — ABNORMAL LOW (ref >39.00)
Total CHOL/HDL Ratio: 5
Triglycerides: 513 mg/dL — ABNORMAL HIGH (ref 0.0–149.0)

## 2023-03-06 NOTE — Assessment & Plan Note (Signed)
Chronic recommend she follow-up with Dr. Katrinka Blazing

## 2023-03-06 NOTE — Progress Notes (Signed)
[email or web address]

## 2023-03-06 NOTE — Assessment & Plan Note (Signed)
Chronic, reasonably well-controlled Continue amlodipine 5 mg/day and lisinopril 20 mg daily. Check metabolic panel today Follow-up in 1 month

## 2023-03-06 NOTE — Assessment & Plan Note (Signed)
Referral to transportation services made today to assist with transportation to medical appointments.  Patient to continue following up with Dr. Katrinka Blazing and vestibular physical therapy as already referred.
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
## 2023-03-06 NOTE — Assessment & Plan Note (Signed)
Chronic Collect lipid panel today further recommendations may be made based upon these results

## 2023-03-06 NOTE — Telephone Encounter (Signed)
   Telephone encounter was:  Successful.  03/06/2023 Name: Carol Woods MRN: [id number] DOB: 03/26/1934  Carol Woods is a 89 y.o. year old female who is a primary care patient of Elenore Paddy, NP . The community resource team was consulted for assistance with Transportation Needs   Care guide performed the following interventions: Patient provided with information about care guide support team and interviewed to confirm resource needs.Pt is needing transportation resources and is going to call [employer] to verify her benefits and call me back   Follow Up Plan:  Care guide will follow up with patient by phone over the next day   [facility] Guide, [facility] [phone] [address] [place], [place], [place] [address] Phone: [phone] Email: [email or web address]

## 2023-03-06 NOTE — Assessment & Plan Note (Signed)
Labs ordered, further recommendations may be made based upon his results Consider liquid form as patient does report intermittent dysphagia at times which is the reason why she discontinued oral potassium in the past.

## 2023-03-06 NOTE — Assessment & Plan Note (Signed)
Chronic Labs ordered, further recommendations may be made based upon his results. 

## 2023-03-06 NOTE — Assessment & Plan Note (Signed)
Metabolic panel and vitamin D level ordered today, further recommendations may be made based upon the results

## 2023-03-06 NOTE — Assessment & Plan Note (Signed)
Etiology unclear, does appear to be consistent with solid mass based on visual inspection.  Referral to oral surgeon made for further evaluation.

## 2023-03-09 ENCOUNTER — Ambulatory Visit: Payer: Medicare PPO | Attending: Family Medicine | Admitting: Physical Therapy

## 2023-03-09 ENCOUNTER — Encounter: Payer: Self-pay | Admitting: Physical Therapy

## 2023-03-09 ENCOUNTER — Ambulatory Visit: Payer: Medicare PPO | Admitting: Physical Therapy

## 2023-03-09 VITALS — BP 162/75 | HR 75

## 2023-03-09 DIAGNOSIS — R2689 Other abnormalities of gait and mobility: Secondary | ICD-10-CM | POA: Insufficient documentation

## 2023-03-09 DIAGNOSIS — M6281 Muscle weakness (generalized): Secondary | ICD-10-CM | POA: Diagnosis not present

## 2023-03-09 DIAGNOSIS — R2681 Unsteadiness on feet: Secondary | ICD-10-CM | POA: Diagnosis not present
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
## 2023-03-12 ENCOUNTER — Telehealth: Payer: Self-pay

## 2023-03-12 NOTE — Telephone Encounter (Signed)
   Telephone encounter was:  Unsuccessful.  03/12/2023 Name: Carol Woods MRN: [id number] DOB: 1934-03-27  Unsuccessful outbound call made today to assist with:  Transportation Needs   Outreach Attempt:  3rd Attempt.  Referral closed unable to contact patient. Following up with insurance benefits   A HIPAA compliant voice message was left requesting a return call.  Instructed patient to call back    Lenard Forth [facility] Guide, [facility] [phone] [address] [place], [place], [place] [address] Phone: [phone] Email: [email or web address]

## 2023-03-18 ENCOUNTER — Ambulatory Visit: Payer: Medicare PPO | Admitting: Physical Therapy

## 2023-03-18 ENCOUNTER — Encounter: Payer: Self-pay | Admitting: Physical Therapy

## 2023-03-18 DIAGNOSIS — R2689 Other abnormalities of gait and mobility: Secondary | ICD-10-CM

## 2023-03-18 DIAGNOSIS — M6281 Muscle weakness (generalized): Secondary | ICD-10-CM

## 2023-03-18 DIAGNOSIS — R2681 Unsteadiness on feet: Secondary | ICD-10-CM
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
## 2023-03-20 ENCOUNTER — Ambulatory Visit: Payer: Medicare PPO | Admitting: Physical Therapy

## 2023-03-20 ENCOUNTER — Encounter: Payer: Self-pay | Admitting: Physical Therapy

## 2023-03-20 DIAGNOSIS — R2681 Unsteadiness on feet: Secondary | ICD-10-CM | POA: Diagnosis not present

## 2023-03-20 DIAGNOSIS — R2689 Other abnormalities of gait and mobility: Secondary | ICD-10-CM

## 2023-03-20 DIAGNOSIS — M6281 Muscle weakness (generalized): Secondary | ICD-10-CM
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
## 2023-03-24 ENCOUNTER — Encounter: Payer: Self-pay | Admitting: Physical Therapy

## 2023-03-24 ENCOUNTER — Ambulatory Visit: Payer: Medicare PPO | Attending: Family Medicine | Admitting: Physical Therapy

## 2023-03-24 DIAGNOSIS — M6281 Muscle weakness (generalized): Secondary | ICD-10-CM | POA: Diagnosis not present

## 2023-03-24 DIAGNOSIS — R2689 Other abnormalities of gait and mobility: Secondary | ICD-10-CM

## 2023-03-24 DIAGNOSIS — R2681 Unsteadiness on feet: Secondary | ICD-10-CM
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
## 2023-03-27 ENCOUNTER — Encounter: Payer: Self-pay | Admitting: Physical Therapy

## 2023-03-27 ENCOUNTER — Ambulatory Visit: Payer: Medicare PPO | Admitting: Physical Therapy

## 2023-03-27 VITALS — BP 161/82 | HR 75

## 2023-03-27 DIAGNOSIS — R2689 Other abnormalities of gait and mobility: Secondary | ICD-10-CM

## 2023-03-27 DIAGNOSIS — M6281 Muscle weakness (generalized): Secondary | ICD-10-CM | POA: Diagnosis not present

## 2023-03-27 DIAGNOSIS — R2681 Unsteadiness on feet: Secondary | ICD-10-CM | POA: Diagnosis not present
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
## 2023-03-31 ENCOUNTER — Ambulatory Visit: Payer: Medicare PPO | Admitting: Physical Therapy

## 2023-03-31 DIAGNOSIS — R2689 Other abnormalities of gait and mobility: Secondary | ICD-10-CM | POA: Diagnosis not present

## 2023-03-31 DIAGNOSIS — M6281 Muscle weakness (generalized): Secondary | ICD-10-CM | POA: Diagnosis not present

## 2023-03-31 DIAGNOSIS — R2681 Unsteadiness on feet: Secondary | ICD-10-CM | POA: Diagnosis not present
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
## 2023-04-01 ENCOUNTER — Encounter: Payer: Self-pay | Admitting: Physical Therapy

## 2023-04-03 ENCOUNTER — Ambulatory Visit: Payer: Medicare PPO | Admitting: Physical Therapy

## 2023-04-07 ENCOUNTER — Encounter: Payer: Self-pay | Admitting: Physical Therapy

## 2023-04-07 ENCOUNTER — Ambulatory Visit: Payer: Medicare PPO | Admitting: Physical Therapy

## 2023-04-07 DIAGNOSIS — R2681 Unsteadiness on feet: Secondary | ICD-10-CM | POA: Diagnosis not present

## 2023-04-07 DIAGNOSIS — R2689 Other abnormalities of gait and mobility: Secondary | ICD-10-CM

## 2023-04-07 DIAGNOSIS — M6281 Muscle weakness (generalized): Secondary | ICD-10-CM | POA: Diagnosis not present
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
## 2023-04-08 NOTE — Progress Notes (Signed)
   04/08/23 0001  Berg Balance Test  Sit to Stand 3  Standing Unsupported 0  Sitting with Back Unsupported but Feet Supported on Floor or Stool 4  Stand to Sit 4  Transfers 4  Standing Unsupported with Eyes Closed 0  Standing Unsupported with Feet Together 2  From Standing, Reach Forward with Outstretched Arm 1  From Standing Position, Pick up Object from Floor 1  From Standing Position, Turn to Look Behind Over each Shoulder 1  Turn 360 Degrees 1  Standing Unsupported, Alternately Place Feet on Step/Stool 1  Standing Unsupported, One Foot in Front 2  Standing on One Leg 0  Total Score 24

## 2023-04-10 ENCOUNTER — Encounter: Payer: Self-pay | Admitting: Physical Therapy

## 2023-04-10 ENCOUNTER — Ambulatory Visit: Payer: Medicare PPO | Admitting: Physical Therapy

## 2023-04-10 DIAGNOSIS — R2681 Unsteadiness on feet: Secondary | ICD-10-CM | POA: Diagnosis not present

## 2023-04-10 DIAGNOSIS — M6281 Muscle weakness (generalized): Secondary | ICD-10-CM | POA: Diagnosis not present

## 2023-04-10 DIAGNOSIS — R2689 Other abnormalities of gait and mobility: Secondary | ICD-10-CM
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
## 2023-04-14 ENCOUNTER — Ambulatory Visit: Payer: Medicare PPO | Admitting: Physical Therapy

## 2023-04-14 ENCOUNTER — Encounter: Payer: Self-pay | Admitting: Physical Therapy

## 2023-04-14 DIAGNOSIS — M6281 Muscle weakness (generalized): Secondary | ICD-10-CM | POA: Diagnosis not present

## 2023-04-14 DIAGNOSIS — R2681 Unsteadiness on feet: Secondary | ICD-10-CM | POA: Diagnosis not present

## 2023-04-14 DIAGNOSIS — R2689 Other abnormalities of gait and mobility: Secondary | ICD-10-CM | POA: Diagnosis not present

## 2023-04-14 NOTE — Progress Notes (Unsigned)
[name] [address] [place] [address] Phone: [phone] Subjective:   Bruce Donath, am serving as a scribe for Dr. Antoine Primas.Bruce Donath, am serving as a scribe for Dr. Antoine Primas.  I'm seeing this patient by the request  of:  Elenore Paddy, NP  CC: Neck pain follow-up  [id number]  02/25/2023 Will send patient to vestibular and neuro in case this is potentially playing a role.  Think patient will do well with conservative therapy otherwise.  Follow-up again in 6 to 8 weeks.  Worsening symptoms to seek medical attention     Patient will start doing the exercises.  Discussed which activities to do and which ones to avoid.  Continue to work on home exercises and will start with formal physical therapy.  Patient is having some mild difficulty with balance and will send to vestibular neuro as well.  Follow-up again in 6 to 8 weeks     Update 04/15/2023 Carol Woods is a 89 y.o. female coming in with complaint of R shoulder and sternum pain. Has been doing PT. Patient states that her chest pain has calmed down. R shoulder pain has improved.   Has had some pain in neck recently with rotation. Pain will shoot up into her head. Hx of cervical fusion.   Pain in arm is in the upper arm of L arm over bicep. Pain occurring at night.  Feels like she is dropping things on a more regular basis.   Did have a CT scan previously that did show the degenerative disc disease at multiple levels of the back    Past Medical History:  Diagnosis Date   Arthritis    "back; shoulders" (08/31/2014)   Atypical chest pain 11/21/2019   Cataracts, both eyes    Essential hypertension 07/13/2018   GERD (gastroesophageal reflux disease)    OTC, changed diet   High cholesterol    Hypertension    Hypothyroidism    Past Surgical History:  Procedure Laterality Date   ANTERIOR CERVICAL DECOMP/DISCECTOMY FUSION  02/2010   Dr Danielle Dess   BACK SURGERY      COLONOSCOPY W/ POLYPECTOMY     DILATION AND CURETTAGE OF UTERUS     POSTERIOR LUMBAR FUSION  04/2010   Dr Danielle Dess   TONSILLECTOMY     age 89   TOTAL SHOULDER ARTHROPLASTY Right 08/31/2014   TOTAL SHOULDER ARTHROPLASTY Right 08/31/2014   Procedure: TOTAL SHOULDER ARTHROPLASTY;  Surgeon: Mable Paris, MD;  Location: [facility] OR;  Service: Orthopedics;  Laterality: Right;  Right total shoulder arthroplasty   Social History   Socioeconomic History   Marital status: Divorced    Spouse name: Not on file   Number of children: Not on file   Years of education: Not on file   Highest education level: Not on file  Occupational History   Not on file  Tobacco Use   Smoking status: Former    Current packs/day: 0.00    Average packs/day: 1.5 packs/day for 25.0 years (37.5 ttl pk-yrs)    Types: Cigarettes    Start date: 08/23/1959    Quit date: 08/22/1984    Years since quitting: 38.6   Smokeless tobacco: Never  Vaping Use   Vaping status: Never Used  Substance and Sexual Activity   Alcohol use: Yes    Alcohol/week: 12.0 standard drinks of alcohol    Types: 12 Glasses of wine per week   Drug use: No  Sexual activity: Not on file  Other Topics Concern   Not on file  Social History Narrative   Not on file   Social Determinants of Health   Financial Resource Strain: Not on file  Food Insecurity: Not on file  Transportation Needs: Unmet Transportation Needs (03/06/2023)   PRAPARE - Administrator, Civil Service (Medical): Yes    Lack of Transportation (Non-Medical): Yes  Physical Activity: Not on file  Stress: Not on file  Social Connections: Not on file   No Known Allergies Family History  Problem Relation Age of Onset   Breast cancer Mother    Ovarian cancer Mother    Alzheimer's disease Mother    Parkinson's disease Father    Heart disease Father    Hypothyroidism Father    Hypertension Sister    Breast cancer Sister    Kidney disease Sister    Prostate  cancer Brother    Heart attack Cousin    Colon cancer Neg Hx    Esophageal cancer Neg Hx    Inflammatory bowel disease Neg Hx    Liver disease Neg Hx    Pancreatic cancer Neg Hx    Rectal cancer Neg Hx    Stomach cancer Neg Hx     Current Outpatient Medications (Endocrine & Metabolic):    alendronate (FOSAMAX) 70 MG tablet, Take 1 tablet po q weekly as directed Oral   levothyroxine (SYNTHROID, LEVOTHROID) 112 MCG tablet, Take 112 mcg by mouth daily before breakfast.  Current Outpatient Medications (Cardiovascular):    amLODipine (NORVASC) 2.5 MG tablet, TAKE 3 TABLETS IN THE EVENING FOR BLOOD PRESSURE. (Patient taking differently: TAKE 2 TABLETS IN THE EVENING FOR BLOOD PRESSURE.)   lisinopril (PRINIVIL,ZESTRIL) 20 MG tablet,     Current Outpatient Medications (Hematological):    cyanocobalamin 1000 MCG tablet, Take 1,000 mcg by mouth daily.  Current Outpatient Medications (Other):    b complex vitamins capsule, Take 1 capsule by mouth daily.   CALCIUM PO, Take 1 tablet by mouth daily.   Cholecalciferol (VITAMIN D PO), Take 1 tablet by mouth daily.   clindamycin (CLEOCIN T) 1 % external solution, Apply 1 application topically daily.    hydrocortisone 2.5 % cream, Apply 1 Application topically daily.   latanoprost (XALATAN) 0.005 % ophthalmic solution, Place 1 drop into both eyes at bedtime.   lidocaine (LIDODERM) 5 %, Place 1 patch onto the skin daily. Remove & Discard patch within 12 hours or as directed by MD   Magnesium 500 MG CAPS, Take 500 mg by mouth daily.   Omega-3 Fatty Acids (FISH OIL PO), Take 1 capsule by mouth daily.   tiZANidine (ZANAFLEX) 2 MG tablet, Take 1 tablet (2 mg total) by mouth at bedtime.   Reviewed prior external information including notes and imaging from  primary care provider As well as notes that were available from care everywhere and other healthcare systems.  Past medical history, social, surgical and family history all reviewed in electronic  medical record.  No pertanent information unless stated regarding to the chief complaint.   Review of Systems:  No headache, visual changes, nausea, vomiting, diarrhea, constipation, dizziness, abdominal pain, skin rash, fevers, chills, night sweats, weight loss, swollen lymph nodes, body aches, joint swelling, chest pain, shortness of breath, mood changes. POSITIVE muscle aches  Objective  Blood pressure 128/62, pulse 78, height 5' (1.524 m), weight 126 lb (57.2 kg), SpO2 98%.   General: No apparent distress alert and oriented x3 mood and affect  normal, dressed appropriately.  HEENT: Pupils equal, extraocular movements intact  Respiratory: Patient's speak in full sentences and does not appear short of breath  Cardiovascular: No lower extremity edema, non tender, no erythema  Neck exam does have some loss of lordosis.  Positive Spurling's noted with radicular symptoms on the left side.  Patient actually does have bilateral side.  Weakness noted in the C8 distribution.  No hypothenar eminence wasting at the moment.  Does have some weakness in grip strength    Impression and Recommendations:    The above documentation has been reviewed and is accurate and complete Judi Saa, DO
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
## 2023-04-15 ENCOUNTER — Ambulatory Visit: Payer: Medicare PPO | Admitting: Family Medicine

## 2023-04-15 ENCOUNTER — Encounter: Payer: Self-pay | Admitting: Family Medicine

## 2023-04-15 VITALS — BP 128/62 | HR 78 | Ht 60.0 in | Wt 126.0 lb

## 2023-04-15 DIAGNOSIS — E538 Deficiency of other specified B group vitamins: Secondary | ICD-10-CM

## 2023-04-15 DIAGNOSIS — M542 Cervicalgia: Secondary | ICD-10-CM | POA: Diagnosis not present

## 2023-04-15 DIAGNOSIS — S134XXA Sprain of ligaments of cervical spine, initial encounter: Secondary | ICD-10-CM | POA: Diagnosis not present

## 2023-04-15 DIAGNOSIS — M4802 Spinal stenosis, cervical region: Secondary | ICD-10-CM

## 2023-04-15 MED ORDER — CYANOCOBALAMIN 1000 MCG/ML IJ SOLN
1000.0000 ug | Freq: Once | INTRAMUSCULAR | Status: AC
Start: 2023-04-15 — End: 2023-04-15
  Administered 2023-04-15: 1000 ug via INTRAMUSCULAR
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
## 2023-04-15 NOTE — Patient Instructions (Signed)
Good to see you B12 injection  MRI of neck ordered call [phone] to schedule We will be in touch once we get the results

## 2023-04-17 ENCOUNTER — Encounter: Payer: Self-pay | Admitting: Physical Therapy

## 2023-04-17 ENCOUNTER — Ambulatory Visit: Payer: Medicare PPO | Admitting: Physical Therapy

## 2023-04-17 DIAGNOSIS — M6281 Muscle weakness (generalized): Secondary | ICD-10-CM

## 2023-04-17 DIAGNOSIS — R2681 Unsteadiness on feet: Secondary | ICD-10-CM

## 2023-04-17 DIAGNOSIS — R2689 Other abnormalities of gait and mobility: Secondary | ICD-10-CM | POA: Diagnosis not present
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
## 2023-04-21 ENCOUNTER — Ambulatory Visit: Payer: Medicare PPO | Admitting: Physical Therapy

## 2023-04-23 ENCOUNTER — Telehealth (INDEPENDENT_AMBULATORY_CARE_PROVIDER_SITE_OTHER): Payer: Medicare PPO | Admitting: Nurse Practitioner

## 2023-04-23 ENCOUNTER — Telehealth: Payer: Self-pay | Admitting: Nurse Practitioner

## 2023-04-23 ENCOUNTER — Encounter: Payer: Self-pay | Admitting: Family Medicine

## 2023-04-23 ENCOUNTER — Other Ambulatory Visit (HOSPITAL_COMMUNITY): Payer: Self-pay

## 2023-04-23 VITALS — Temp 100.5°F

## 2023-04-23 DIAGNOSIS — U071 COVID-19: Secondary | ICD-10-CM | POA: Diagnosis not present

## 2023-04-23 MED ORDER — BENZONATATE 100 MG PO CAPS: 100.0000 mg | ORAL_CAPSULE | Freq: Two times a day (BID) | ORAL | 0 refills | Status: DC | PRN

## 2023-04-23 MED ORDER — PAXLOVID (300/100) 20 X 150 MG & 10 X 100MG PO TBPK
3.0000 | ORAL_TABLET | Freq: Two times a day (BID) | ORAL | 0 refills | Status: DC
  Filled 2023-04-23: qty 30, 5d supply, fill #0

## 2023-04-23 MED ORDER — NIRMATRELVIR/RITONAVIR (PAXLOVID)TABLET
3.0000 | ORAL_TABLET | Freq: Two times a day (BID) | ORAL | 0 refills | Status: AC
Start: 2023-04-23 — End: 2023-04-28

## 2023-04-23 NOTE — Telephone Encounter (Signed)
Please call patient and schedule her for annual wellness visit within the next few months and a follow-up with me within the next 3 months.

## 2023-04-23 NOTE — Progress Notes (Signed)
   Established Patient Office Visit  An audio/visual tele-health visit was completed today for this patient. I connected with  Carol Woods on 04/23/23 utilizing audio/visual technology and verified that I am speaking with the correct person using two identifiers. The patient was located at their home, and I was located at the office of [facility] at [facility] during the encounter. I discussed the limitations of evaluation and management by telemedicine. The patient expressed understanding and agreed to proceed.    Subjective   Patient ID: Carol Woods, female    DOB: September 12, 1934  Age: 89 y.o. MRN: [id number]  Chief Complaint  Patient presents with   Covid Positive    Carol Woods arrives today for a virtual visit for the above. She has a past medical history significant for GERD, hyperlipidemia, hypertension, hypothyroidism, , Arthritis.  She reports that she took a COVID-19 test earlier this week and it was positive.  Symptoms started 2 days ago.  Overall she is feeling a bit better today, but still feels under the weather.     Review of Systems  Constitutional:  Positive for chills and malaise/fatigue.  Respiratory:  Positive for cough. Negative for shortness of breath.   Cardiovascular:  Negative for chest pain.  Gastrointestinal:  Negative for diarrhea, nausea and vomiting.  Musculoskeletal:  Negative for myalgias.  Neurological:  Negative for headaches.      Objective:     Temp (!) 100.5 F (38.1 C)    Physical Exam Comprehensive physical exam not completed today as office visit was conducted remotely.  No evidence of acute respiratory distress noted on video.  Patient was alert and oriented, and appeared to have appropriate judgment.   No results found for any visits on 04/23/23.    The ASCVD Risk score (Arnett DK, et al., 2019) failed to calculate for the following reasons:   The 2019 ASCVD risk score is only valid for ages 75 to 37    Assessment  & Plan:   Problem List Items Addressed This Visit       Other   COVID-19 - Primary    Acute Symptoms mild Patient is at higher risk for more severe disease based on age and chronic medical conditions, per shared says making she will take course of Paxlovid. Most recent GFR 80.06. Drug interaction check completed, she was told that if she experiences dizziness or hypotension to reduce amlodipine dose by 50%. Did discuss importance of rest, hydration, intermittent physical activity within the home.  Call 911 if symptoms worsen especially if she experiences high fever or difficulty breathing. Patient encouraged to call office with any questions or concerns.      Relevant Medications   nirmatrelvir/ritonavir (PAXLOVID) 20 x 150 MG & 10 x 100MG  TABS   benzonatate (TESSALON) 100 MG capsule    Return in about 3 months (around 07/24/2023) for F/U with Maralyn Sago.    Carol Paddy, NP

## 2023-04-23 NOTE — Assessment & Plan Note (Signed)
Acute Symptoms mild Patient is at higher risk for more severe disease based on age and chronic medical conditions, per shared says making she will take course of Paxlovid. Most recent GFR 80.06. Drug interaction check completed, she was told that if she experiences dizziness or hypotension to reduce amlodipine dose by 50%. Did discuss importance of rest, hydration, intermittent physical activity within the home.  Call 911 if symptoms worsen especially if she experiences high fever or difficulty breathing. Patient encouraged to call office with any questions or concerns.

## 2023-04-24 ENCOUNTER — Ambulatory Visit: Payer: Medicare PPO | Admitting: Physical Therapy

## 2023-04-24 NOTE — Telephone Encounter (Signed)
Will you call this per and schedule annual wellness and f/u w/Carol Woods in 3 months.Marland KitchenShearon Stalls

## 2023-04-27 ENCOUNTER — Other Ambulatory Visit: Payer: Medicare PPO

## 2023-04-28 ENCOUNTER — Ambulatory Visit: Payer: Medicare PPO | Admitting: Physical Therapy

## 2023-04-30 ENCOUNTER — Ambulatory Visit: Payer: Medicare PPO | Admitting: Physical Therapy

## 2023-05-01 ENCOUNTER — Ambulatory Visit: Payer: Medicare PPO | Admitting: Physical Therapy

## 2023-05-05 ENCOUNTER — Ambulatory Visit: Payer: Medicare PPO | Attending: Family Medicine | Admitting: Physical Therapy

## 2023-05-05 DIAGNOSIS — M6281 Muscle weakness (generalized): Secondary | ICD-10-CM | POA: Diagnosis not present

## 2023-05-05 DIAGNOSIS — R2689 Other abnormalities of gait and mobility: Secondary | ICD-10-CM | POA: Diagnosis not present

## 2023-05-05 DIAGNOSIS — R2681 Unsteadiness on feet: Secondary | ICD-10-CM | POA: Insufficient documentation
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
## 2023-05-06 ENCOUNTER — Encounter: Payer: Self-pay | Admitting: Physical Therapy

## 2023-05-06 NOTE — Progress Notes (Signed)
   05/05/23 0001  Berg Balance Test  Sit to Stand 4  Standing Unsupported 1 (36.65 secs)  Sitting with Back Unsupported but Feet Supported on Floor or Stool 4  Stand to Sit 4  Transfers 4  Standing Unsupported with Eyes Closed 0  Standing Unsupported with Feet Together 3  From Standing, Reach Forward with Outstretched Arm 1  From Standing Position, Pick up Object from Floor 3  From Standing Position, Turn to Look Behind Over each Shoulder 1  Turn 360 Degrees 1  Standing Unsupported, Alternately Place Feet on Step/Stool 1  Standing Unsupported, One Foot in Front 2  Standing on One Leg 0  Total Score 29

## 2023-05-07 ENCOUNTER — Ambulatory Visit: Payer: Medicare PPO | Admitting: Physical Therapy

## 2023-05-07 DIAGNOSIS — R2681 Unsteadiness on feet: Secondary | ICD-10-CM

## 2023-05-07 DIAGNOSIS — M6281 Muscle weakness (generalized): Secondary | ICD-10-CM

## 2023-05-07 DIAGNOSIS — R2689 Other abnormalities of gait and mobility: Secondary | ICD-10-CM

## 2023-05-07 NOTE — Addendum Note (Signed)
Addended by: Althea Charon on: 05/07/2023 08:20 AM   Modules accepted: Orders

## 2023-05-08 ENCOUNTER — Ambulatory Visit: Payer: Medicare PPO | Admitting: Physical Therapy

## 2023-05-10 ENCOUNTER — Encounter: Payer: Self-pay | Admitting: Physical Therapy
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
## 2023-05-12 ENCOUNTER — Ambulatory Visit: Payer: Medicare PPO | Admitting: Physical Therapy

## 2023-05-12 ENCOUNTER — Encounter: Payer: Self-pay | Admitting: Physical Therapy

## 2023-05-12 DIAGNOSIS — R2681 Unsteadiness on feet: Secondary | ICD-10-CM

## 2023-05-12 DIAGNOSIS — R2689 Other abnormalities of gait and mobility: Secondary | ICD-10-CM | POA: Diagnosis not present

## 2023-05-12 DIAGNOSIS — M6281 Muscle weakness (generalized): Secondary | ICD-10-CM | POA: Diagnosis not present
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
## 2023-05-14 ENCOUNTER — Encounter: Payer: Self-pay | Admitting: Physical Therapy

## 2023-05-14 ENCOUNTER — Ambulatory Visit: Payer: Medicare PPO | Admitting: Physical Therapy

## 2023-05-14 ENCOUNTER — Ambulatory Visit (INDEPENDENT_AMBULATORY_CARE_PROVIDER_SITE_OTHER): Payer: Medicare PPO

## 2023-05-14 VITALS — Ht 60.0 in | Wt 124.0 lb

## 2023-05-14 DIAGNOSIS — Z0000 Encounter for general adult medical examination without abnormal findings: Secondary | ICD-10-CM | POA: Diagnosis not present

## 2023-05-14 DIAGNOSIS — M6281 Muscle weakness (generalized): Secondary | ICD-10-CM | POA: Diagnosis not present

## 2023-05-14 DIAGNOSIS — R2681 Unsteadiness on feet: Secondary | ICD-10-CM | POA: Diagnosis not present

## 2023-05-14 DIAGNOSIS — R2689 Other abnormalities of gait and mobility: Secondary | ICD-10-CM | POA: Diagnosis not present

## 2023-05-14 NOTE — Patient Instructions (Signed)
Carol Woods , Thank you for taking time to come for your Medicare Wellness Visit. I appreciate your ongoing commitment to your health goals. Please review the following plan we discussed and let me know if I can assist you in the future.   Referrals/Orders/Follow-Ups/Clinician Recommendations: NO  This is a list of the screening recommended for you and due dates:  Health Maintenance  Topic Date Due   DEXA scan (bone density measurement)  Never done   COVID-19 Vaccine (4 - 2023-24 season) 05/23/2022   Flu Shot  04/23/2023   Medicare Annual Wellness Visit  05/13/2024   DTaP/Tdap/Td vaccine (2 - Td or Tdap) 11/16/2028   Pneumonia Vaccine  Completed   Zoster (Shingles) Vaccine  Completed   HPV Vaccine  Aged Out    Advanced directives: (Copy Requested) Please bring a copy of your health care power of attorney and living will to the office to be added to your chart at your convenience.  Next Medicare Annual Wellness Visit scheduled for next year: No  Preventive Care attachment FALL PREVENTION attachment if needed

## 2023-05-14 NOTE — Progress Notes (Signed)
Subjective:   Carol Woods is a 89 y.o. female who presents for Medicare Annual (Subsequent) preventive examination.  Visit Complete: Virtual  I connected with  Carol Woods on 05/14/23 by a audio enabled telemedicine application and verified that I am speaking with the correct person using two identifiers.  Patient Location: Home  Provider Location: Office/Clinic  I discussed the limitations of evaluation and management by telemedicine. The patient expressed understanding and agreed to proceed.  Vital Signs: Because this visit was a virtual/telehealth visit, some criteria may be missing or patient reported. Any vitals not documented were not able to be obtained and vitals that have been documented are patient reported.   Review of Systems     Cardiac Risk Factors include: advanced age (>8men, >60 women);dyslipidemia;hypertension;family history of premature cardiovascular disease     Objective:    Today's Vitals   05/14/23 1403  Weight: 124 lb (56.2 kg)  Height: 5' (1.524 m)  PainSc: 0-No pain   Body mass index is 24.22 kg/m.     05/14/2023    2:04 PM 01/31/2023   11:54 AM 12/16/2022    2:09 PM 11/23/2017    9:14 AM 08/31/2014    6:16 PM 08/22/2014    8:46 AM  Advanced Directives  Does Patient Have a Medical Advance Directive? Yes No Yes Yes Yes Yes  Type of Estate agent of [place];Living will  Healthcare Power of [place];Living will Healthcare Power of [place];Living will Healthcare Power of [place];Living will Healthcare Power of [place];Living will  Does patient want to make changes to medical advance directive?   No - Patient declined  No - Patient declined No - Patient declined  Copy of Healthcare Power of Attorney in Chart? No - copy requested  No - copy requested  Yes Yes  Would patient like information on creating a medical advance directive?  No - Patient declined        Current Medications (verified) Outpatient Encounter  Medications as of 05/14/2023  Medication Sig   alendronate (FOSAMAX) 70 MG tablet Take 1 tablet po q weekly as directed Oral   amLODipine (NORVASC) 2.5 MG tablet TAKE 3 TABLETS IN THE EVENING FOR BLOOD PRESSURE. (Patient taking differently: TAKE 2 TABLETS IN THE EVENING FOR BLOOD PRESSURE.)   b complex vitamins capsule Take 1 capsule by mouth daily.   benzonatate (TESSALON) 100 MG capsule Take 1 capsule (100 mg total) by mouth 2 (two) times daily as needed for cough.   CALCIUM PO Take 1 tablet by mouth daily. (Patient not taking: Reported on 04/23/2023)   Cholecalciferol (VITAMIN D PO) Take 1 tablet by mouth daily.   clindamycin (CLEOCIN T) 1 % external solution Apply 1 application topically daily.    cyanocobalamin 1000 MCG tablet Take 1,000 mcg by mouth daily.   hydrocortisone 2.5 % cream Apply 1 Application topically daily.   latanoprost (XALATAN) 0.005 % ophthalmic solution Place 1 drop into both eyes at bedtime.   levothyroxine (SYNTHROID, LEVOTHROID) 112 MCG tablet Take 112 mcg by mouth daily before breakfast.   lisinopril (PRINIVIL,ZESTRIL) 20 MG tablet    Magnesium 500 MG CAPS Take 500 mg by mouth daily. (Patient not taking: Reported on 04/23/2023)   nirmatrelvir & ritonavir (PAXLOVID, 300/100,) 20 x 150 MG & 10 x 100MG  TBPK Take 3 tablets by mouth 2 (two) times daily.   Omega-3 Fatty Acids (FISH OIL PO) Take 1 capsule by mouth daily. (Patient not taking: Reported on 04/23/2023)   No facility-administered encounter  medications on file as of 05/14/2023.    Allergies (verified) Patient has no known allergies.   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/2014   TOTAL SHOULDER ARTHROPLASTY Right 08/31/2014   Procedure: TOTAL SHOULDER ARTHROPLASTY;  Surgeon: Mable Paris, MD;  Location: [facility] OR;  Service: Orthopedics;  Laterality: Right;  Right total shoulder arthroplasty   Family History  Problem Relation Age of Onset   Breast cancer Mother    Ovarian cancer Mother    Alzheimer's disease Mother    Parkinson's disease Father    Heart disease Father    Hypothyroidism Father    Hypertension Sister    Breast cancer Sister    Kidney disease Sister    Prostate cancer Brother    Heart attack Cousin    Colon cancer Neg Hx    Esophageal cancer Neg Hx    Inflammatory bowel disease Neg Hx    Liver disease Neg Hx    Pancreatic cancer Neg Hx    Rectal cancer Neg Hx    Stomach cancer Neg Hx    Social History   Socioeconomic History   Marital status: Divorced    Spouse name: Not on file   Number of children: Not on file   Years of education: Not on file   Highest education level: Not on file  Occupational History   Not on file  Tobacco Use   Smoking status: Former    Current packs/day: 0.00    Average packs/day: 1.5 packs/day for 25.0 years (37.5 ttl pk-yrs)    Types: Cigarettes    Start date: 08/23/1959    Quit date: 08/22/1984    Years since quitting: 38.7   Smokeless tobacco: Never  Vaping Use   Vaping status: Never Used  Substance and Sexual Activity   Alcohol use: Yes    Alcohol/week: 12.0 standard drinks of alcohol    Types: 12 Glasses of wine per week   Drug use: No   Sexual activity: Not on file  Other Topics Concern   Not on file  Social History Narrative   Not on file   Social Determinants of Health   Financial Resource Strain: Low Risk  (05/14/2023)    Overall Financial Resource Strain (CARDIA)    Difficulty of Paying Living Expenses: Not hard at all  Food Insecurity: No Food Insecurity (05/14/2023)   Hunger Vital Sign    Worried About Running Out of Food in the Last Year: Never true    Ran Out of Food in the Last Year: Never true  Transportation Needs: No Transportation Needs (05/14/2023)   PRAPARE - Administrator, Civil Service (Medical): No    Lack of Transportation (Non-Medical): No  Recent Concern: Transportation Needs - Unmet Transportation Needs (03/06/2023)   PRAPARE - Transportation  Lack of Transportation (Medical): Yes    Lack of Transportation (Non-Medical): Yes  Physical Activity: Sufficiently Active (05/14/2023)   Exercise Vital Sign    Days of Exercise per Week: 5 days    Minutes of Exercise per Session: 30 min  Stress: No Stress Concern Present (05/14/2023)   [employer] of Occupational Health - Occupational Stress Questionnaire    Feeling of Stress : Not at all  Social Connections: Moderately Isolated (05/14/2023)   Social Connection and Isolation Panel [NHANES]    Frequency of Communication with Friends and Family: More than three times a week    Frequency of Social Gatherings with Friends and Family: More than three times a week    Attends Religious Services: Never    Database administrator or Organizations: Yes    Attends Banker Meetings: Never    Marital Status: Divorced    Tobacco Counseling Counseling given: Not Answered   Clinical Intake:  Pre-visit preparation completed: Yes  Pain : No/denies pain Pain Score: 0-No pain     BMI - recorded: 24.22 Nutritional Status: BMI of 19-24  Normal Nutritional Risks: None Diabetes: No  How often do you need to have someone help you when you read instructions, pamphlets, or other written materials from your doctor or pharmacy?: 1 - Never What is the last grade level you completed in school?: HSG  Interpreter Needed?:  No  Information entered by :: Sharyah Bostwick N. Taquila Leys, LPN.   Activities of Daily Living    05/14/2023    2:07 PM  In your present state of health, do you have any difficulty performing the following activities:  Hearing? 0  Vision? 0  Difficulty concentrating or making decisions? 0  Walking or climbing stairs? 0  Dressing or bathing? 0  Doing errands, shopping? 0  Preparing Food and eating ? N  Using the Toilet? N  In the past six months, have you accidently leaked urine? N  Do you have problems with loss of bowel control? N  Managing your Medications? N  Managing your Finances? N  Housekeeping or managing your Housekeeping? N    Patient Care Team: Elenore Paddy, NP as PCP - General (Nurse Practitioner) Chilton Si, MD as PCP - Cardiology (Cardiology) Magrinat, Valentino Hue, MD (Inactive) as Consulting Physician (Oncology) Levert Feinstein, MD as Consulting Physician (Neurology) Noland Fordyce, MD as Consulting Physician (Obstetrics and Gynecology) Maris Berger, MD as Consulting Physician (Ophthalmology)  Indicate any recent Medical Services you may have received from other than [facility] providers in the past year (date may be approximate).     Assessment:   This is a routine wellness examination for Carol Woods.  Hearing/Vision screen Hearing Screening - Comments:: Patient denied any hearing difficulty.   No hearing aids.   Vision Screening - Comments:: Patient does wear corrective lenses/contacts.  Annual eye exam done by: Maris Berger, MD.    Dietary issues and exercise activities discussed:     Goals Addressed             This Visit's Progress    Patient Stated: To stay independent.        Depression Screen    05/14/2023    2:07 PM 03/06/2023   10:33 AM  PHQ 2/9 Scores  PHQ - 2 Score 0 0  PHQ- 9 Score 0     Fall Risk    05/14/2023    2:05 PM 03/06/2023   10:32 AM 03/18/2018    1:08 PM  Fall Risk  Falls in the past year? 0 0 Yes  Number falls in  past yr: 0 0 2 or more  Injury with Fall? 0 0 No  Risk Factor Category    High Fall Risk  Risk for fall due to : No Fall Risks No Fall Risks   Risk for fall due to: Comment  cane   Follow up Falls prevention discussed Falls evaluation completed Education provided    MEDICARE RISK AT HOME: Medicare Risk at Home Any stairs in or around the home?: No If so, are there any without handrails?: No Home free of loose throw rugs in walkways, pet beds, electrical cords, etc?: Yes Adequate lighting in your home to reduce risk of falls?: Yes Life alert?: No Use of a cane, walker or w/c?: No Grab bars in the bathroom?: Yes Shower chair or bench in shower?: No Elevated toilet seat or a handicapped toilet?: Yes  TIMED UP AND GO:  Was the test performed?  No    Cognitive Function:        05/14/2023    2:06 PM  6CIT Screen  What Year? 0 points  What month? 0 points  What time? 0 points  Count back from 20 0 points  Months in reverse 0 points  Repeat phrase 0 points  Total Score 0 points    Immunizations Immunization History  Administered Date(s) Administered   Influenza, High Dose Seasonal PF 07/28/2019   Influenza-Unspecified 06/22/2014   PFIZER(Purple Top)SARS-COV-2 Vaccination 10/07/2019, 10/28/2019, 06/26/2020   Pneumococcal Conjugate-13 11/21/2013, 12/14/2014   Pneumococcal Polysaccharide-23 09/26/2001, 03/31/2017   Tdap 11/16/2018   Zoster Recombinant(Shingrix) 06/29/2018, 11/16/2018   Zoster, Live 03/19/2012    TDAP status: Up to date  Flu Vaccine status: Up to date  Pneumococcal vaccine status: Up to date  Covid-19 vaccine status: Completed vaccines  Qualifies for Shingles Vaccine? Yes   Zostavax completed Yes   Shingrix Completed?: Yes  Screening Tests Health Maintenance  Topic Date Due   DEXA SCAN  Never done   COVID-19 Vaccine (4 - 2023-24 season) 05/23/2022   INFLUENZA VACCINE  04/23/2023   Medicare Annual Wellness (AWV)  05/13/2024   DTaP/Tdap/Td (2  - Td or Tdap) 11/16/2028   Pneumonia Vaccine 53+ Years old  Completed   Zoster Vaccines- Shingrix  Completed   HPV VACCINES  Aged Out    Health Maintenance  Health Maintenance Due  Topic Date Due   DEXA SCAN  Never done   COVID-19 Vaccine (4 - 2023-24 season) 05/23/2022   INFLUENZA VACCINE  04/23/2023    Colorectal cancer screening: No longer required.   Mammogram status: Completed 01/12/2023. Repeat every year  Bone Density status: Completed 02/28/2020. Results reflect: Bone density results: OSTEOPENIA. Repeat every 2-3 years.  Lung Cancer Screening: (Low Dose CT Chest recommended if Age 72-80 years, 20 pack-year currently smoking OR have quit w/in 15years.) does not qualify.   Lung Cancer Screening Referral: no  Additional Screening:  Hepatitis C Screening: does not qualify; Completed: no  Vision Screening: Recommended annual ophthalmology exams for early detection of glaucoma and other disorders of the eye. Is the patient up to date with their annual eye exam?  Yes  Who is the provider or what is the name of the office in which the patient attends annual eye exams? Maris Berger, MD. If pt is not established with a provider, would they like to be referred to a provider to establish care? No .   Dental Screening: Recommended annual dental exams for proper  oral hygiene  Diabetic Foot Exam: N/A  Community Resource Referral / Chronic Care Management: CRR required this visit?  No   CCM required this visit?  No     Plan:     I have personally reviewed and noted the following in the patient's chart:   Medical and social history Use of alcohol, tobacco or illicit drugs  Current medications and supplements including opioid prescriptions. Patient is not currently taking opioid prescriptions. Functional ability and status Nutritional status Physical activity Advanced directives List of other physicians Hospitalizations, surgeries, and ER visits in previous 12  months Vitals Screenings to include cognitive, depression, and falls Referrals and appointments  In addition, I have reviewed and discussed with patient certain preventive protocols, quality metrics, and best practice recommendations. A written personalized care plan for preventive services as well as general preventive health recommendations were provided to patient.     Mickeal Needy, LPN   [id number]   After Visit Summary: (Mail) Due to this being a telephonic visit, the after visit summary with patients personalized plan was offered to patient via mail   Nurse Notes: Normal cognitive status assessed by direct observation via telephone conversation by this Nurse Health Advisor. No abnormalities found.
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
## 2023-05-15 DIAGNOSIS — K1370 Unspecified lesions of oral mucosa: Secondary | ICD-10-CM | POA: Diagnosis not present

## 2023-05-16 ENCOUNTER — Ambulatory Visit
Admission: RE | Admit: 2023-05-16 | Discharge: 2023-05-16 | Disposition: A | Discharge status: Home / Self Care | Payer: Medicare PPO | Source: Ambulatory Visit | Attending: Family Medicine | Admitting: Family Medicine

## 2023-05-16 DIAGNOSIS — S134XXA Sprain of ligaments of cervical spine, initial encounter: Secondary | ICD-10-CM

## 2023-05-16 DIAGNOSIS — Z981 Arthrodesis status: Secondary | ICD-10-CM | POA: Diagnosis not present

## 2023-05-16 DIAGNOSIS — M542 Cervicalgia: Secondary | ICD-10-CM

## 2023-05-16 DIAGNOSIS — M79603 Pain in arm, unspecified: Secondary | ICD-10-CM | POA: Diagnosis not present

## 2023-05-19 ENCOUNTER — Ambulatory Visit: Payer: Medicare PPO | Admitting: Physical Therapy

## 2023-05-19 ENCOUNTER — Encounter: Payer: Self-pay | Admitting: Physical Therapy

## 2023-05-19 DIAGNOSIS — M6281 Muscle weakness (generalized): Secondary | ICD-10-CM

## 2023-05-19 DIAGNOSIS — R2689 Other abnormalities of gait and mobility: Secondary | ICD-10-CM | POA: Diagnosis not present

## 2023-05-19 DIAGNOSIS — R2681 Unsteadiness on feet: Secondary | ICD-10-CM
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
## 2023-05-21 ENCOUNTER — Ambulatory Visit: Payer: Medicare PPO | Admitting: Physical Therapy

## 2023-05-21 DIAGNOSIS — M6281 Muscle weakness (generalized): Secondary | ICD-10-CM | POA: Diagnosis not present

## 2023-05-21 DIAGNOSIS — R2689 Other abnormalities of gait and mobility: Secondary | ICD-10-CM

## 2023-05-21 DIAGNOSIS — R2681 Unsteadiness on feet: Secondary | ICD-10-CM

## 2023-05-22 ENCOUNTER — Encounter: Payer: Self-pay | Admitting: Physical Therapy
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
## 2023-05-26 ENCOUNTER — Ambulatory Visit: Payer: Medicare PPO | Attending: Family Medicine | Admitting: Physical Therapy

## 2023-05-26 DIAGNOSIS — R2681 Unsteadiness on feet: Secondary | ICD-10-CM | POA: Diagnosis not present

## 2023-05-26 DIAGNOSIS — M6281 Muscle weakness (generalized): Secondary | ICD-10-CM | POA: Diagnosis not present

## 2023-05-26 DIAGNOSIS — R2689 Other abnormalities of gait and mobility: Secondary | ICD-10-CM | POA: Diagnosis not present
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
## 2023-05-27 ENCOUNTER — Encounter: Payer: Self-pay | Admitting: Physical Therapy

## 2023-05-28 ENCOUNTER — Ambulatory Visit: Payer: Medicare PPO | Admitting: Physical Therapy

## 2023-05-28 ENCOUNTER — Encounter: Payer: Self-pay | Admitting: Physical Therapy

## 2023-05-28 DIAGNOSIS — R2689 Other abnormalities of gait and mobility: Secondary | ICD-10-CM

## 2023-05-28 DIAGNOSIS — R2681 Unsteadiness on feet: Secondary | ICD-10-CM

## 2023-05-28 DIAGNOSIS — M6281 Muscle weakness (generalized): Secondary | ICD-10-CM
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
## 2023-05-29 ENCOUNTER — Other Ambulatory Visit: Payer: Self-pay | Admitting: Nurse Practitioner

## 2023-05-29 DIAGNOSIS — I10 Essential (primary) hypertension: Secondary | ICD-10-CM

## 2023-06-02 ENCOUNTER — Ambulatory Visit: Payer: Medicare PPO | Admitting: Physical Therapy

## 2023-06-02 DIAGNOSIS — R2689 Other abnormalities of gait and mobility: Secondary | ICD-10-CM

## 2023-06-02 DIAGNOSIS — R2681 Unsteadiness on feet: Secondary | ICD-10-CM

## 2023-06-02 DIAGNOSIS — M6281 Muscle weakness (generalized): Secondary | ICD-10-CM | POA: Diagnosis not present
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
## 2023-06-03 ENCOUNTER — Encounter: Payer: Self-pay | Admitting: Physical Therapy

## 2023-06-03 NOTE — Progress Notes (Signed)
   06/03/23 0001  Berg Balance Test  Sit to Stand 3  Standing Unsupported 0  Sitting with Back Unsupported but Feet Supported on Floor or Stool 4  Stand to Sit 4  Transfers 4  Standing Unsupported with Eyes Closed 2  Standing Unsupported with Feet Together 2  From Standing, Reach Forward with Outstretched Arm 1  From Standing Position, Pick up Object from Floor 3  From Standing Position, Turn to Look Behind Over each Shoulder 2  Turn 360 Degrees 1  Standing Unsupported, Alternately Place Feet on Step/Stool 1  Standing Unsupported, One Foot in Front 1  Standing on One Leg 1  Total Score 29

## 2023-06-08 ENCOUNTER — Ambulatory Visit: Payer: Medicare PPO | Admitting: Physical Therapy

## 2023-06-11 ENCOUNTER — Ambulatory Visit: Payer: Medicare PPO | Admitting: Physical Therapy

## 2023-06-11 ENCOUNTER — Encounter: Payer: Self-pay | Admitting: Physical Therapy

## 2023-06-11 DIAGNOSIS — M6281 Muscle weakness (generalized): Secondary | ICD-10-CM

## 2023-06-11 DIAGNOSIS — R2681 Unsteadiness on feet: Secondary | ICD-10-CM | POA: Diagnosis not present

## 2023-06-11 DIAGNOSIS — R2689 Other abnormalities of gait and mobility: Secondary | ICD-10-CM | POA: Diagnosis not present
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
## 2023-06-11 NOTE — Patient Instructions (Signed)
Access Code: [email or web address] URL: [email or web address] Date: 06/11/2023 Prepared by: [name]  Exercises - Forward and Backward Stepping at [employer]  - 1 x daily - 7 x weekly - 3 sets - 10 reps - Lateral Stepping at Pool Wall  - 1 x daily - 7 x weekly - 3 sets - 10 reps - Standing Hip Abduction Adduction at Pool Wall  - 1 x daily - 7 x weekly - 3 sets - 10 reps - Plank with Hip Extension at [employer]  - 1 x daily - 7 x weekly - 3 sets - 10 reps - Bilateral Shoulder Flexion Extension with Hand Floats at [employer]  - 1 x daily - 7 x weekly - 3 sets - 10 reps - Standing March at [facility]  - 1 x daily - 7 x weekly - 3 sets - 10 reps

## 2023-06-25 DIAGNOSIS — H353132 Nonexudative age-related macular degeneration, bilateral, intermediate dry stage: Secondary | ICD-10-CM | POA: Diagnosis not present

## 2023-06-25 DIAGNOSIS — H401132 Primary open-angle glaucoma, bilateral, moderate stage: Secondary | ICD-10-CM | POA: Diagnosis not present

## 2023-07-23 DIAGNOSIS — F32A Depression, unspecified: Secondary | ICD-10-CM | POA: Diagnosis not present

## 2023-07-23 DIAGNOSIS — F419 Anxiety disorder, unspecified: Secondary | ICD-10-CM | POA: Diagnosis not present

## 2023-08-10 ENCOUNTER — Other Ambulatory Visit: Payer: Self-pay | Admitting: Family Medicine

## 2023-08-11 ENCOUNTER — Telehealth: Payer: Self-pay | Admitting: Nurse Practitioner

## 2023-08-11 ENCOUNTER — Other Ambulatory Visit: Payer: Self-pay | Admitting: Nurse Practitioner

## 2023-08-11 NOTE — Telephone Encounter (Signed)
Prescription Request  08/11/2023  LOV: 03/06/2023  What is the name of the medication or equipment? levothyroxine  Have you contacted your pharmacy to request a refill? Yes   Which pharmacy would you like this sent to?  [facility] [place], [place] - [address] [place] [address] Phone: [phone] Fax: [phone]    Patient notified that their request is being sent to the clinical staff for review and that they should receive a response within 2 business days.   Please advise at Mobile [phone] (mobile)

## 2023-08-12 MED ORDER — LEVOTHYROXINE SODIUM 112 MCG PO TABS: 112.0000 ug | ORAL_TABLET | Freq: Every day | ORAL | 1 refills | Status: DC

## 2023-08-12 NOTE — Telephone Encounter (Signed)
Medication request send

## 2023-08-17 DIAGNOSIS — F32A Depression, unspecified: Secondary | ICD-10-CM | POA: Diagnosis not present

## 2023-08-17 DIAGNOSIS — F419 Anxiety disorder, unspecified: Secondary | ICD-10-CM | POA: Diagnosis not present

## 2023-08-27 ENCOUNTER — Encounter: Payer: Self-pay | Admitting: Family Medicine

## 2023-09-01 ENCOUNTER — Other Ambulatory Visit: Payer: Self-pay

## 2023-09-01 DIAGNOSIS — M48062 Spinal stenosis, lumbar region with neurogenic claudication: Secondary | ICD-10-CM

## 2023-09-01 DIAGNOSIS — M4802 Spinal stenosis, cervical region: Secondary | ICD-10-CM

## 2023-09-08 ENCOUNTER — Ambulatory Visit: Payer: Medicare PPO | Attending: Family Medicine | Admitting: Physical Therapy

## 2023-09-08 ENCOUNTER — Encounter: Payer: Self-pay | Admitting: Physical Therapy

## 2023-09-08 DIAGNOSIS — M4802 Spinal stenosis, cervical region: Secondary | ICD-10-CM | POA: Diagnosis not present

## 2023-09-08 DIAGNOSIS — R2681 Unsteadiness on feet: Secondary | ICD-10-CM | POA: Insufficient documentation

## 2023-09-08 DIAGNOSIS — R2689 Other abnormalities of gait and mobility: Secondary | ICD-10-CM | POA: Diagnosis not present

## 2023-09-08 DIAGNOSIS — M48062 Spinal stenosis, lumbar region with neurogenic claudication: Secondary | ICD-10-CM | POA: Insufficient documentation

## 2023-09-08 DIAGNOSIS — M6281 Muscle weakness (generalized): Secondary | ICD-10-CM | POA: Insufficient documentation

## 2023-09-08 NOTE — Therapy (Unsigned)
OUTPATIENT PHYSICAL THERAPY NEURO EVALUATION   Patient Name: Carol Woods MRN: [id number] DOB:06/19/1934, 89 y.o., female Today's Date: 09/09/2023   PCP: Elenore Paddy., NP REFERRING PROVIDER: Judi Saa, DO  END OF SESSION:  PT End of Session - 09/09/23 1011     Visit Number 1    Number of Visits 9    Date for PT Re-Evaluation 11/13/23   pushed out 1 week due to scheduling   Authorization Type [employer]    Authorization Time Period 09-08-23 - 11-20-23    PT Start Time 1148    PT Stop Time 1236    PT Time Calculation (min) 48 min    Activity Tolerance Patient tolerated treatment well    Behavior During Therapy [facility] for tasks assessed/performed             Past Medical History:  Diagnosis Date   Arthritis    "back; shoulders" (08/31/2014)   Atypical chest pain 11/21/2019   Cataracts, both eyes    Essential hypertension 07/13/2018   GERD (gastroesophageal reflux disease)    OTC, changed diet   High cholesterol    Hypertension    Hypothyroidism    Past Surgical History:  Procedure Laterality Date   ANTERIOR CERVICAL DECOMP/DISCECTOMY FUSION  02/2010   Dr Danielle Dess   BACK SURGERY     COLONOSCOPY W/ POLYPECTOMY     DILATION AND CURETTAGE OF UTERUS     POSTERIOR LUMBAR FUSION  04/2010   Dr Danielle Dess   TONSILLECTOMY     age 67   TOTAL SHOULDER ARTHROPLASTY Right 08/31/2014   TOTAL SHOULDER ARTHROPLASTY Right 08/31/2014   Procedure: TOTAL SHOULDER ARTHROPLASTY;  Surgeon: Mable Paris, MD;  Location: [facility] OR;  Service: Orthopedics;  Laterality: Right;  Right total shoulder arthroplasty   Patient Active Problem List   Diagnosis Date Noted   COVID-19 04/23/2023   Spinal stenosis in cervical region 04/15/2023   Diarrhea 03/06/2023   Falls infrequently 03/06/2023   Fatigue 03/06/2023   Forgetfulness 03/06/2023   Light headedness 03/06/2023   Mass of hard palate 03/06/2023   Hypokalemia 03/06/2023   Hypothyroidism 03/06/2023   Hyperlipidemia  03/06/2023   Hypocalcemia 03/06/2023   Contusion of chest 02/05/2023   Left foot drop 10/31/2022   Pill dysphagia 07/11/2022   Change in bowel habits 07/11/2022   History of colonic polyps 07/11/2022   GERD (gastroesophageal reflux disease) 11/21/2019   Atypical chest pain 11/21/2019   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   Essential hypertension 07/13/2018   Lumbar spondylosis 06/10/2018   Thoracic spinal stenosis 03/18/2018   Spinal stenosis of lumbar region 03/18/2018   Gait abnormality 02/04/2018   Paresthesia 02/04/2018   MGUS (monoclonal gammopathy of unknown significance) 12/02/2017   Status post total shoulder arthroplasty 08/31/2014    ONSET DATE: Referral date 09-01-23  REFERRING DIAG:  Diagnosis  M48.062 (ICD-10-CM) - Spinal stenosis of lumbar region with neurogenic claudication  M48.02 (ICD-10-CM) - Spinal stenosis in cervical region    THERAPY DIAG:  Unsteadiness on feet  Other abnormalities of gait and mobility  Muscle weakness (generalized)  Rationale for Evaluation and Treatment: Rehabilitation  SUBJECTIVE:  SUBJECTIVE STATEMENT: Pt reports she had MRI on 05-16-23 due to having increased pain;  pt was discharged from PT at this facility on 06-11-23.  Pt states she became depressed and anxious and had a decline in functional status due to depression.  Pt reports she usually does not use SPC in home - only uses it when she goes out.  Reports some near falls but no falls since D/C in Sept. 2024.  Almost fell at church yesterday when she forgot a step was there when she backed up.  Thinks LLE is getting weaker than it was - also reports arm muscles occasionally really hurts -  "a soreness".  Pt was previously seen at this facility for OP PT from  03-09-23 - 06-11-23 for 20 visits to address balance and gait deficits.  PERTINENT HISTORY: PMH: HTN, Hypothyroidism, arthritis,spinal stenosis, L foot drop, neuropathy    PAIN:  Are you having pain? No - "Not right now" - states it comes and goes  PRECAUTIONS: Fall  RED FLAGS: None    FALLS: Has patient fallen in last 6 months? No, pt denies almost falls, but has to be careful   LIVING ENVIRONMENT: Lives with: lives alone Lives in: House/apartment Stairs: 1 story, 4 steps to enter with a railing  Has following equipment at home: Single point cane, Grab bars, and SPC with 4 prong tip   PLOF: Independent with community mobility with device  PATIENT GOALS: Wants to work on building up strength and improve balance  OBJECTIVE:   COGNITION: Overall cognitive status: Within functional limits for tasks assessed   SENSATION: Light touch: WFL Neuropathies in hands and feet   POSTURE:  No Significant postural limitations    LOWER EXTREMITY MMT:   MMT Right eval Left eval  Hip flexion 4 4  Hip abduction    Hip adduction    Hip internal rotation    Hip external rotation    Knee flexion 4 4  Knee extension 5 5  Ankle dorsiflexion 4- 2+  Ankle plantarflexion 2 2-  Ankle inversion    Ankle eversion    (Blank rows = not tested)  All tested in sitting    TRANSFERS: Assistive device utilized: None  Sit to stand: CGA Stand to sit: CGA With no UE support, bracing against chair with BLE for balance and weight onto heels   GAIT: Gait pattern: step through pattern, decreased stride length, decreased ankle dorsiflexion- Left, Left steppage, and lateral hip instability, foot slap LLE  Distance walked: Clinic distances  Assistive device utilized: Single point cane and with rubber quad tip  Level of assistance: SBA Comments: Pt with L foot slap, pt has previously received a L AFO, but does not wear it due to it being uncomfortable   FUNCTIONAL TESTS:  5 times sit to  stand: 25.47 seconds with no UE support  Timed up and go (TUG): 13.69 seconds without SPC with ribber quad tip;  11.75 secs with SPC  10 meter walk test: 16.84, 14.16 (2nd trial) seconds = 1.95 ft/sec with SPC with quad tip    PATIENT EDUCATION: Education details: Clinical findings, POC - compared scores from previous admission with scores in today's eval Person educated: Patient Education method: Explanation Education comprehension: verbalized understanding  HOME EXERCISE PROGRAM: Will review previous HEP and issue new exercises as appropriate  GOALS: Goals reviewed with patient? Yes  SHORT TERM GOALS: Target date: 10-09-23  Pt will be independent with initial HEP for strength, gait, balance in order  to build upon functional gains made in therapy. Baseline: Goal status: INITIAL   2.  Pt will improve TUG time to </= 12 seconds without SPC to demo decreased fall risk. Baseline: 11.75 secs with SPC:  13.69 secs without SPC Goal status: INITIAL  3.  Pt will improve gait velocity to >/= 2.2 ft/sec with use of SPC for incr. gait efficiency.  Baseline: 16.84 seconds = 1.95 ft/sec with SPC with quad tip  Goal status: INITIAL  4.  Pt will improve 5x sit<>stand to less than or equal to 21 secs without UE support and no episodes of BLE bracing to demonstrate improved functional strength and balance. Baseline: 25.47 seconds with no UE support from chair Goal status: INITIAL   LONG TERM GOALS: Target date: 11-06-23   Pt will be independent with updated HEP for strength & balance exercises to maintain functional mobility.  Baseline:  Goal status: INITIAL  2.  Pt will improve 5x sit<>stand to less than or equal to 19 secs with no UE support and no episodes of BLE bracing to demonstrate improved functional strength and balance.  Baseline: 25.47 seconds with no UE support Goal status: INITIAL  3. Pt will improve gait velocity to >/= 2.4 ft/sec with use of SPC for incr. gait efficiency.   Baseline: 16.84 seconds = 1.95 ft/sec with SPC with quad tip  Goal status: INITIAL  5.  Pt will improve TUG time to </= 10 seconds without SPC to demo decreased fall risk. Baseline: 11.75 secs with SPC:  13.69 secs without SPC Goal status: INITIAL     ASSESSMENT:  CLINICAL IMPRESSION: Patient is a 89 year old female referred to Neuro OPPT for balance and gait disorder.   Pt's PMH is significant for: HTN, Hypothyroidism, arthritis,spinal stenosis, L foot drop, neuropathy . The following deficits were present during the exam: impaired balance, gait abnormalities, L foot drop (pt has AFO but does not wear it due to being uncomfortable), decr strength, impaired sensation due to neuropathy. Based on TUG and 5x sit <> stand, pt is an incr risk for falls.  Pt would benefit from skilled PT to address these impairments and functional limitations to maximize functional mobility independence and decr fall risk.     OBJECTIVE IMPAIRMENTS: Abnormal gait, decreased activity tolerance, decreased balance, decreased coordination, decreased mobility, difficulty walking, decreased ROM, decreased strength, and impaired sensation.   ACTIVITY LIMITATIONS: stairs, transfers, and locomotion level  PARTICIPATION LIMITATIONS: driving and community activity  PERSONAL FACTORS: Age, Behavior pattern, Past/current experiences, Time since onset of injury/illness/exacerbation, Transportation, and 3+ comorbidities: HTN, Hypothyroidism, arthritis,spinal stenosis, L foot drop, neuropathy   are also affecting patient's functional outcome.   REHAB POTENTIAL: Good  CLINICAL DECISION MAKING: Evolving/moderate complexity  EVALUATION COMPLEXITY: Moderate   PLAN:  PT FREQUENCY: 1x/week  PT DURATION: 8 weeks + eval  PLANNED INTERVENTIONS: Therapeutic exercises, Therapeutic activity, Neuromuscular re-education, Balance training, Gait training, Patient/Family education, Self Care, Joint mobilization, Stair training,  Vestibular training, Orthotic/Fit training, DME instructions, Aquatic Therapy, Manual therapy, and Re-evaluation  PLAN FOR NEXT SESSION: review balance HEP - bil. LE strengthening   Kary Kos, PT 09/09/2023, 10:13 AM

## 2023-09-11 ENCOUNTER — Other Ambulatory Visit: Payer: Self-pay | Admitting: Nurse Practitioner

## 2023-09-22 ENCOUNTER — Encounter: Payer: Self-pay | Admitting: Physical Therapy

## 2023-09-22 ENCOUNTER — Ambulatory Visit: Payer: Medicare PPO | Admitting: Physical Therapy

## 2023-09-22 DIAGNOSIS — R2689 Other abnormalities of gait and mobility: Secondary | ICD-10-CM | POA: Diagnosis not present

## 2023-09-22 DIAGNOSIS — R2681 Unsteadiness on feet: Secondary | ICD-10-CM

## 2023-09-22 DIAGNOSIS — M6281 Muscle weakness (generalized): Secondary | ICD-10-CM | POA: Diagnosis not present

## 2023-09-22 DIAGNOSIS — M4802 Spinal stenosis, cervical region: Secondary | ICD-10-CM | POA: Diagnosis not present

## 2023-09-22 DIAGNOSIS — M48062 Spinal stenosis, lumbar region with neurogenic claudication: Secondary | ICD-10-CM | POA: Diagnosis not present

## 2023-09-22 NOTE — Therapy (Addendum)
 OUTPATIENT PHYSICAL THERAPY NEURO TREATMENT NOTE   Patient Name: Carol Woods MRN: [id number] DOB:09-Mar-1934, 89 y.o., female Today's Date: 09/22/2023   PCP: Elnor Lauraine FORBES., NP REFERRING PROVIDER: Claudene Arthea HERO, DO  END OF SESSION:  PT End of Session - 09/22/23 1849     Visit Number 2    Number of Visits 9    Date for PT Re-Evaluation 11/13/23   pushed out 1 week due to scheduling   Authorization Type [employer]    Authorization Time Period 09-08-23 - 11-20-23    PT Start Time 1150    PT Stop Time 1233    PT Time Calculation (min) 43 min    Equipment Utilized During Treatment Gait belt    Activity Tolerance Patient tolerated treatment well    Behavior During Therapy WFL for tasks assessed/performed              Past Medical History:  Diagnosis Date   Arthritis    back; shoulders (08/31/2014)   Atypical chest pain 11/21/2019   Cataracts, both eyes    Essential hypertension 07/13/2018   GERD (gastroesophageal reflux disease)    OTC, changed diet   High cholesterol    Hypertension    Hypothyroidism    Past Surgical History:  Procedure Laterality Date   ANTERIOR CERVICAL DECOMP/DISCECTOMY FUSION  02/2010   Dr Colon   BACK SURGERY     COLONOSCOPY W/ POLYPECTOMY     DILATION AND CURETTAGE OF UTERUS     POSTERIOR LUMBAR FUSION  04/2010   Dr Colon   TONSILLECTOMY     age 26   TOTAL SHOULDER ARTHROPLASTY Right 08/31/2014   TOTAL SHOULDER ARTHROPLASTY Right 08/31/2014   Procedure: TOTAL SHOULDER ARTHROPLASTY;  Surgeon: Eva Elsie Herring, MD;  Location: [facility] OR;  Service: Orthopedics;  Laterality: Right;  Right total shoulder arthroplasty   Patient Active Problem List   Diagnosis Date Noted   COVID-19 04/23/2023   Spinal stenosis in cervical region 04/15/2023   Diarrhea 03/06/2023   Falls infrequently 03/06/2023   Fatigue 03/06/2023   Forgetfulness 03/06/2023   Light headedness 03/06/2023   Mass of hard palate 03/06/2023   Hypokalemia  03/06/2023   Hypothyroidism 03/06/2023   Hyperlipidemia 03/06/2023   Hypocalcemia 03/06/2023   Contusion of chest 02/05/2023   Left foot drop 10/31/2022   Pill dysphagia 07/11/2022   Change in bowel habits 07/11/2022   History of colonic polyps 07/11/2022   GERD (gastroesophageal reflux disease) 11/21/2019   Atypical chest pain 11/21/2019   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   Essential hypertension 07/13/2018   Lumbar spondylosis 06/10/2018   Thoracic spinal stenosis 03/18/2018   Spinal stenosis of lumbar region 03/18/2018   Gait abnormality 02/04/2018   Paresthesia 02/04/2018   MGUS (monoclonal gammopathy of unknown significance) 12/02/2017   Status post total shoulder arthroplasty 08/31/2014    ONSET DATE: Referral date 09-01-23  REFERRING DIAG:  Diagnosis  M48.062 (ICD-10-CM) - Spinal stenosis of lumbar region with neurogenic claudication  M48.02 (ICD-10-CM) - Spinal stenosis in cervical region    THERAPY DIAG:  Unsteadiness on feet  Other abnormalities of gait and mobility  Muscle weakness (generalized)  Rationale for Evaluation and Treatment: Rehabilitation  SUBJECTIVE:  SUBJECTIVE STATEMENT: Pt reports she was doing rather well at time of initial eval 2 weeks ago, but then received some bad news about the health of a family member and has not been doing as well - has not felt like doing her exercises very much  PERTINENT HISTORY: PMH: HTN, Hypothyroidism, arthritis,spinal stenosis, L foot drop, neuropathy    PAIN:  Are you having pain? No - Not right now - states it comes and goes  PRECAUTIONS: Fall  RED FLAGS: None    FALLS: Has patient fallen in last 6 months? No, pt denies almost falls, but has to be careful   LIVING  ENVIRONMENT: Lives with: lives alone Lives in: House/apartment Stairs: 1 story, 4 steps to enter with a railing  Has following equipment at home: Single point cane, Grab bars, and SPC with 4 prong tip   PLOF: Independent with community mobility with device  PATIENT GOALS: Wants to work on building up strength and improve balance  OBJECTIVE:   COGNITION: Overall cognitive status: Within functional limits for tasks assessed   SENSATION: Light touch: WFL Neuropathies in hands and feet   POSTURE:  No Significant postural limitations    LOWER EXTREMITY MMT:   MMT Right eval Left eval  Hip flexion 4 4  Hip abduction    Hip adduction    Hip internal rotation    Hip external rotation    Knee flexion 4 4  Knee extension 5 5  Ankle dorsiflexion 4- 2+  Ankle plantarflexion 2 2-  Ankle inversion    Ankle eversion    (Blank rows = not tested)  All tested in sitting    TRANSFERS: Assistive device utilized: None  Sit to stand: CGA Stand to sit: CGA With no UE support, bracing against chair with BLE for balance and weight onto heels   GAIT: Gait pattern: step through pattern, decreased stride length, decreased ankle dorsiflexion- Left, Left steppage, and lateral hip instability, foot slap LLE  Distance walked: Clinic distances  Assistive device utilized: Single point cane and with rubber quad tip  Level of assistance: SBA Comments: Pt with L foot slap, pt has previously received a L AFO, but does not wear it due to it being uncomfortable   FUNCTIONAL TESTS:  5 times sit to stand: 25.47 seconds with no UE support  Timed up and go (TUG): 13.69 seconds without SPC with ribber quad tip;  11.75 secs with SPC  10 meter walk test: 16.84, 14.16 (2nd trial) seconds = 1.95 ft/sec with SPC with quad tip    PATIENT EDUCATION: Education details: Clinical findings, POC - compared scores from previous admission with scores in today's eval Person educated: Patient Education method:  Explanation Education comprehension: verbalized understanding  HOME EXERCISE PROGRAM: Will review previous HEP and issue new exercises as appropriate  Today's Treatment:  09-22-23  TherEx:  Sit to stand from mat without UE support - 10 reps - pt performed incrementally with extension in LE's in order to assist in maintaining balance- pt also braced legs against mat table prn to assist with balance recovery  Step up exercise onto 6 step with bil. UE support on hand rails 10 reps each  GAIT: Gait pattern: step through pattern, decreased stride length, decreased ankle dorsiflexion- Left, Left steppage, and lateral hip instability, foot slap LLE  Distance walked: Clinic distances; 115' (1 lap around track)  Assistive device utilized: Single point cane and with rubber quad tip; pt did not use SPC during session Level of  assistance: CGA without use of SPC  NeuroRe-ed: SLS activity - tap ups to 1st step 5 reps each LE with UE support prn on rails; to 2nd step - alternate tap ups 5 reps each foot with bil. UE support Rockerboard anterior/posterior inside // bars 10 reps with min. Bil. UE support;  Marching in place 10 reps each leg with UE support prn on // bars   GOALS: Goals reviewed with patient? Yes  SHORT TERM GOALS: Target date: 10-09-23  Pt will be independent with initial HEP for strength, gait, balance in order to build upon functional gains made in therapy. Baseline: Goal status: INITIAL   2.  Pt will improve TUG time to </= 12 seconds without SPC to demo decreased fall risk. Baseline: 11.75 secs with SPC:  13.69 secs without SPC Goal status: INITIAL  3.  Pt will improve gait velocity to >/= 2.2 ft/sec with use of SPC for incr. gait efficiency.  Baseline: 16.84 seconds = 1.95 ft/sec with SPC with quad tip  Goal status: INITIAL  4.  Pt will improve 5x sit<>stand to less than or equal to 21 secs without UE support and no episodes of BLE bracing to demonstrate improved  functional strength and balance. Baseline: 25.47 seconds with no UE support from chair Goal status: INITIAL   LONG TERM GOALS: Target date: 11-06-23   Pt will be independent with updated HEP for strength & balance exercises to maintain functional mobility.  Baseline:  Goal status: INITIAL  2.  Pt will improve 5x sit<>stand to less than or equal to 19 secs with no UE support and no episodes of BLE bracing to demonstrate improved functional strength and balance.  Baseline: 25.47 seconds with no UE support Goal status: INITIAL  3. Pt will improve gait velocity to >/= 2.4 ft/sec with use of SPC for incr. gait efficiency.  Baseline: 16.84 seconds = 1.95 ft/sec with SPC with quad tip  Goal status: INITIAL  5.  Pt will improve TUG time to </= 10 seconds without SPC to demo decreased fall risk. Baseline: 11.75 secs with SPC:  13.69 secs without SPC Goal status: INITIAL     ASSESSMENT:  CLINICAL IMPRESSION: PT session focused on gait training without use of SPC (115') with CGA for safety due to balance deficits and on balance training for improved SLS and strengthening exercises for bil. LE's.  Pt unable to perform sit to stand transfer in fluid movement without LOB; performs LE extension incrementally to assist with maintaining balance in small ROM/movements.  Cont with POC.       OBJECTIVE IMPAIRMENTS: Abnormal gait, decreased activity tolerance, decreased balance, decreased coordination, decreased mobility, difficulty walking, decreased ROM, decreased strength, and impaired sensation.   ACTIVITY LIMITATIONS: stairs, transfers, and locomotion level  PARTICIPATION LIMITATIONS: driving and community activity  PERSONAL FACTORS: Age, Behavior pattern, Past/current experiences, Time since onset of injury/illness/exacerbation, Transportation, and 3+ comorbidities: HTN, Hypothyroidism, arthritis,spinal stenosis, L foot drop, neuropathy   are also affecting patient's functional outcome.    REHAB POTENTIAL: Good  CLINICAL DECISION MAKING: Evolving/moderate complexity  EVALUATION COMPLEXITY: Moderate   PLAN:  PT FREQUENCY: 1x/week  PT DURATION: 8 weeks + eval  PLANNED INTERVENTIONS: Therapeutic exercises, Therapeutic activity, Neuromuscular re-education, Balance training, Gait training, Patient/Family education, Self Care, Joint mobilization, Stair training, Vestibular training, Orthotic/Fit training, DME instructions, Aquatic Therapy, Manual therapy, and Re-evaluation  PLAN FOR NEXT SESSION: review balance HEP - bil. LE strengthening   Roxanna [place], PT 09/22/2023, 6:51 PM

## 2023-09-29 ENCOUNTER — Ambulatory Visit: Payer: Medicare PPO | Admitting: Physical Therapy

## 2023-10-08 ENCOUNTER — Ambulatory Visit: Payer: Medicare PPO | Admitting: Physical Therapy

## 2023-10-13 ENCOUNTER — Ambulatory Visit: Payer: Medicare PPO | Admitting: Physical Therapy

## 2023-10-22 ENCOUNTER — Ambulatory Visit: Payer: Medicare PPO | Admitting: Physical Therapy

## 2023-10-27 ENCOUNTER — Ambulatory Visit: Payer: Medicare PPO | Admitting: Physical Therapy

## 2023-11-05 ENCOUNTER — Ambulatory Visit: Payer: Medicare PPO | Admitting: Physical Therapy

## 2023-11-05 DIAGNOSIS — F4320 Adjustment disorder, unspecified: Secondary | ICD-10-CM | POA: Diagnosis not present

## 2023-11-10 ENCOUNTER — Ambulatory Visit: Payer: Medicare PPO | Admitting: Physical Therapy

## 2023-12-04 ENCOUNTER — Telehealth: Payer: Self-pay | Admitting: Family Medicine

## 2023-12-04 NOTE — Telephone Encounter (Signed)
 Patient would like to talk about MRI with Dr.Smith. Patient states her hands are numb and her upper arms hurt. She also says her wrists are sore and her legs don't feel right. She says she is  more unbalanced as well. Patient also spoke to the pharmacist that she picked her anxiety medication up from after she was noticing coming from [place] that she has lost a lot of hair. The pharmacist asked if she took a thyroid medication and suggested she have her thyroid checked. She wanted to see about getting that checked as well. She has scheduled an appointment for April and is on a call list if an earlier appointment becomes available. She had to stop doing PT because of a family situation and was out of PT for a while. I wanted to make you all aware of everything she told me on the phone call and let you know she is scheduled.

## 2023-12-04 NOTE — Telephone Encounter (Signed)
 Scheduled for March 19th at 3:45.
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
## 2023-12-09 ENCOUNTER — Ambulatory Visit: Admitting: Family Medicine

## 2023-12-09 VITALS — BP 128/60 | HR 68 | Ht 60.0 in

## 2023-12-09 DIAGNOSIS — R269 Unspecified abnormalities of gait and mobility: Secondary | ICD-10-CM

## 2023-12-09 DIAGNOSIS — E876 Hypokalemia: Secondary | ICD-10-CM

## 2023-12-09 DIAGNOSIS — M2550 Pain in unspecified joint: Secondary | ICD-10-CM

## 2023-12-09 DIAGNOSIS — M4802 Spinal stenosis, cervical region: Secondary | ICD-10-CM

## 2023-12-09 DIAGNOSIS — D472 Monoclonal gammopathy: Secondary | ICD-10-CM | POA: Diagnosis not present

## 2023-12-09 NOTE — Assessment & Plan Note (Signed)
 Depending on patient's nerve conduction study will discussed with patient again at great length about potentially starting formal physical therapy again but would like to know how she responds to the interventions first.

## 2023-12-09 NOTE — Patient Instructions (Addendum)
 [facility] [phone] Call Today  Labs today Referral Neurology and Neurosurgery See you again end of April

## 2023-12-09 NOTE — Assessment & Plan Note (Signed)
 Concern of worsening symptoms.  After much discussion patient has elected to try a C7-T1 epidural.  Hopeful that this will make some improvement.  Does have weakness noted and we do want to run the multiple myeloma panel as well to make sure nothing else is contributing.  Patient is grieving the loss of her daughter as well and has not been as active and so I do think deconditioning is playing a role.  Nerve conduction studies also added to further evaluate.  Patient will follow-up again in 6 weeks after the injection.  Patient is to be traveling to [place] again for the ceremony for her daughter in May. Did discuss also would like her to follow-up with neurosurgery to know her other possibility with the adjacent segment disease at the C3-C4 with the moderate to severe spinal stenosis

## 2023-12-10 LAB — COMPREHENSIVE METABOLIC PANEL
ALT: 13 U/L (ref 0–35)
AST: 16 U/L (ref 0–37)
Albumin: 4.3 g/dL (ref 3.5–5.2)
Alkaline Phosphatase: 57 U/L (ref 39–117)
BUN: 13 mg/dL (ref 6–23)
CO2: 33 meq/L — ABNORMAL HIGH (ref 19–32)
Calcium: 9.5 mg/dL (ref 8.4–10.5)
Chloride: 101 meq/L (ref 96–112)
Creatinine, Ser: 0.67 mg/dL (ref 0.40–1.20)
GFR: 78.46 mL/min (ref >60.00)
Glucose, Bld: 92 mg/dL (ref 70–99)
Potassium: 3 meq/L — ABNORMAL LOW (ref 3.5–5.1)
Sodium: 140 meq/L (ref 135–145)
Total Bilirubin: 0.5 mg/dL (ref 0.2–1.2)
Total Protein: 7.6 g/dL (ref 6.0–8.3)

## 2023-12-10 LAB — CBC WITH DIFFERENTIAL/PLATELET
Basophils Absolute: 0.1 10*3/uL (ref 0.0–0.1)
Basophils Relative: 1.3 % (ref 0.0–3.0)
Eosinophils Absolute: 0.2 10*3/uL (ref 0.0–0.7)
Eosinophils Relative: 2.6 % (ref 0.0–5.0)
HCT: 39.6 % (ref 36.0–46.0)
Hemoglobin: 13.5 g/dL (ref 12.0–15.0)
Lymphocytes Relative: 27.9 % (ref 12.0–46.0)
Lymphs Abs: 1.9 10*3/uL (ref 0.7–4.0)
MCHC: 34.1 g/dL (ref 30.0–36.0)
MCV: 87.6 fl (ref 78.0–100.0)
Monocytes Absolute: 0.7 10*3/uL (ref 0.1–1.0)
Monocytes Relative: 9.5 % (ref 3.0–12.0)
Neutro Abs: 4.1 10*3/uL (ref 1.4–7.7)
Neutrophils Relative %: 58.7 % (ref 43.0–77.0)
Platelets: 231 10*3/uL (ref 150.0–400.0)
RBC: 4.52 Mil/uL (ref 3.87–5.11)
RDW: 14 % (ref 11.5–15.5)
WBC: 7 10*3/uL (ref 4.0–10.5)

## 2023-12-10 LAB — IBC PANEL
Iron: 57 ug/dL (ref 42–145)
Saturation Ratios: 16.7 % — ABNORMAL LOW (ref 20.0–50.0)
TIBC: 341.6 ug/dL (ref 250.0–450.0)
Transferrin: 244 mg/dL (ref 212.0–360.0)

## 2023-12-10 LAB — SEDIMENTATION RATE: Sed Rate: 38 mm/h — ABNORMAL HIGH (ref 0–30)

## 2023-12-10 LAB — PTH, INTACT AND CALCIUM
Calcium: 9.2 mg/dL (ref 8.6–10.4)
PTH: 45 pg/mL (ref 16–77)

## 2023-12-10 LAB — MAGNESIUM: Magnesium: 2.2 mg/dL (ref 1.5–2.5)

## 2023-12-10 LAB — TSH: TSH: 2.55 u[IU]/mL (ref 0.35–5.50)

## 2023-12-10 LAB — T4, FREE: Free T4: 1.03 ng/dL (ref 0.60–1.60)

## 2023-12-10 LAB — T3, FREE: T3, Free: 3.6 pg/mL (ref 2.3–4.2)

## 2023-12-10 LAB — FERRITIN: Ferritin: 29 ng/mL (ref 10.0–291.0)

## 2023-12-11 ENCOUNTER — Encounter: Payer: Self-pay | Admitting: Nurse Practitioner

## 2023-12-14 ENCOUNTER — Encounter: Payer: Self-pay | Admitting: Family Medicine

## 2023-12-15 ENCOUNTER — Encounter: Payer: Self-pay | Admitting: Neurology

## 2023-12-15 ENCOUNTER — Encounter: Payer: Self-pay | Admitting: Family Medicine

## 2023-12-15 LAB — MULTIPLE MYELOMA PANEL, SERUM
Albumin SerPl Elph-Mcnc: 3.8 g/dL (ref 2.9–4.4)
Albumin/Glob SerPl: 1.1 (ref 0.7–1.7)
Alpha 1: 0.2 g/dL (ref 0.0–0.4)
Alpha2 Glob SerPl Elph-Mcnc: 1 g/dL (ref 0.4–1.0)
B-Globulin SerPl Elph-Mcnc: 0.8 g/dL (ref 0.7–1.3)
Gamma Glob SerPl Elph-Mcnc: 1.6 g/dL (ref 0.4–1.8)
Globulin, Total: 3.5 g/dL (ref 2.2–3.9)
IgA/Immunoglobulin A, Serum: 98 mg/dL (ref 64–422)
IgG (Immunoglobin G), Serum: 1670 mg/dL — ABNORMAL HIGH (ref 586–1602)
IgM (Immunoglobulin M), Srm: 63 mg/dL (ref 26–217)
M Protein SerPl Elph-Mcnc: 0.8 g/dL — ABNORMAL HIGH
Total Protein: 7.3 g/dL (ref 6.0–8.5)

## 2023-12-16 ENCOUNTER — Other Ambulatory Visit: Payer: Self-pay

## 2023-12-16 DIAGNOSIS — R202 Paresthesia of skin: Secondary | ICD-10-CM

## 2023-12-18 NOTE — Discharge Instructions (Signed)

## 2023-12-21 ENCOUNTER — Ambulatory Visit
Admission: RE | Admit: 2023-12-21 | Discharge: 2023-12-21 | Disposition: A | Discharge status: Home / Self Care | Source: Ambulatory Visit | Attending: Family Medicine | Admitting: Family Medicine

## 2023-12-21 DIAGNOSIS — M4802 Spinal stenosis, cervical region: Secondary | ICD-10-CM

## 2023-12-21 DIAGNOSIS — M4722 Other spondylosis with radiculopathy, cervical region: Secondary | ICD-10-CM | POA: Diagnosis not present

## 2023-12-21 DIAGNOSIS — Z981 Arthrodesis status: Secondary | ICD-10-CM | POA: Diagnosis not present

## 2023-12-21 MED ORDER — TRIAMCINOLONE ACETONIDE 40 MG/ML IJ SUSP (RADIOLOGY)
60.0000 mg | Freq: Once | INTRAMUSCULAR | Status: AC
  Administered 2023-12-21: 60 mg via EPIDURAL

## 2023-12-21 MED ORDER — IOPAMIDOL (ISOVUE-M 300) INJECTION 61%
1.0000 mL | Freq: Once | INTRAMUSCULAR | Status: AC
  Administered 2023-12-21: 1 mL via EPIDURAL

## 2023-12-25 ENCOUNTER — Ambulatory Visit: Admitting: Family Medicine

## 2023-12-28 ENCOUNTER — Encounter: Admitting: Neurology

## 2023-12-29 DIAGNOSIS — Z961 Presence of intraocular lens: Secondary | ICD-10-CM | POA: Diagnosis not present

## 2023-12-29 DIAGNOSIS — H401132 Primary open-angle glaucoma, bilateral, moderate stage: Secondary | ICD-10-CM | POA: Diagnosis not present

## 2023-12-29 DIAGNOSIS — H353132 Nonexudative age-related macular degeneration, bilateral, intermediate dry stage: Secondary | ICD-10-CM | POA: Diagnosis not present

## 2023-12-29 DIAGNOSIS — H52203 Unspecified astigmatism, bilateral: Secondary | ICD-10-CM | POA: Diagnosis not present

## 2024-01-07 ENCOUNTER — Ambulatory Visit (INDEPENDENT_AMBULATORY_CARE_PROVIDER_SITE_OTHER)

## 2024-01-07 ENCOUNTER — Ambulatory Visit: Admitting: Sports Medicine

## 2024-01-07 VITALS — BP 110/60 | HR 84 | Ht 60.0 in

## 2024-01-07 DIAGNOSIS — R0781 Pleurodynia: Secondary | ICD-10-CM

## 2024-01-07 DIAGNOSIS — M25561 Pain in right knee: Secondary | ICD-10-CM | POA: Diagnosis not present

## 2024-01-07 DIAGNOSIS — G8929 Other chronic pain: Secondary | ICD-10-CM

## 2024-01-07 DIAGNOSIS — M1712 Unilateral primary osteoarthritis, left knee: Secondary | ICD-10-CM | POA: Diagnosis not present

## 2024-01-07 DIAGNOSIS — R296 Repeated falls: Secondary | ICD-10-CM | POA: Diagnosis not present

## 2024-01-07 DIAGNOSIS — M25562 Pain in left knee: Secondary | ICD-10-CM

## 2024-01-07 DIAGNOSIS — M1711 Unilateral primary osteoarthritis, right knee: Secondary | ICD-10-CM | POA: Diagnosis not present

## 2024-01-07 NOTE — Progress Notes (Signed)
 Ale[facility] [address] [place] [address] Phone: [phone]   Assessment and Plan:     1. Chronic pain of both knees 2. Rib pain on left side 3. Multiple falls  -Chronic with exacerbation, initial visit - Patient presents for evaluation after multiple falls including 1 fall last month, and additional fall last week leading to flare of chronic pains in bilateral knees and left sided anterior rib cage.  Consistent with contusions of these areas - X-rays obtained in clinic.  My interpretation: No acute fracture or dislocation.  Large stool burden.  Mild to moderate bilateral knee degenerative changes.  History of lumbar fusion with hardware in place and lumbar degenerative changes superior to fusion - Start Tylenol 500 to 1000 mg tablets 2-3 times a day for day-to-day pain relief -Continue physical activity as tolerated  Patient accompanied by her friend throughout entirety of office visit  Pertinent previous records reviewed include none  Follow Up: As needed for musculoskeletal pain.  May follow-up with Dr. 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  Recommend follow-up with PCP to discuss decreased appetite possible nursing home recommendations   Subjective:   I, Jerene Canny, am serving as a Neurosurgeon for Doctor Richardean Sale  Chief Complaint: fall   HPI:   01/07/24 Patient is a 89 year old female with pain. Patient states walked out onto deck and going back inside and fell. Can't remember whether she tripped or if anything gave way. Knees hurt and L side abdominal pain. No pain at night and doesn't really take anything for the pain. Pain is more discomfort and soreness. Stomach pain and not wanting to eat.   Relevant Historical Information: Hypertension, GERD, hypothyroidism  Additional pertinent review of systems negative.   Current Outpatient Medications:    alendronate (FOSAMAX) 70 MG tablet, Take 1  tablet po q weekly as directed Oral, Disp: , Rfl:    amLODipine (NORVASC) 2.5 MG tablet, TAKE THREE TABLETS BY MOUTH IN THE EVENING FOR BLOOD PRESSURE 30 days, Disp: 90 tablet, Rfl: 5   b complex vitamins capsule, Take 1 capsule by mouth daily., Disp: , Rfl:    benzonatate (TESSALON) 100 MG capsule, Take 1 capsule (100 mg total) by mouth 2 (two) times daily as needed for cough., Disp: 20 capsule, Rfl: 0   CALCIUM PO, Take 1 tablet by mouth daily. (Patient not taking: Reported on 04/23/2023), Disp: , Rfl:    Cholecalciferol (VITAMIN D PO), Take 1 tablet by mouth daily., Disp: , Rfl:    clindamycin (CLEOCIN T) 1 % external solution, Apply 1 application topically daily. , Disp: , Rfl:    cyanocobalamin 1000 MCG tablet, Take 1,000 mcg by mouth daily., Disp: , Rfl:    hydrocortisone 2.5 % cream, Apply 1 Application topically daily., Disp: , Rfl:    latanoprost (XALATAN) 0.005 % ophthalmic solution, Place 1 drop into both eyes at bedtime., Disp: , Rfl:    levothyroxine (SYNTHROID) 112 MCG tablet, Take 1 tablet (112 mcg total) by mouth daily before breakfast., Disp: 90 tablet, Rfl: 1   lisinopril (ZESTRIL) 20 MG tablet, Take 1 tablet (20 mg total) by mouth daily., Disp: 90 tablet, Rfl: 3   Magnesium 500 MG CAPS, Take 500 mg by mouth daily. (Patient not taking: Reported on 04/23/2023), Disp: , Rfl:    nirmatrelvir & ritonavir (PAXLOVID, 300/100,) 20 x 150 MG & 10 x 100MG  TBPK, Take 3 tablets by mouth 2 (two) times daily., Disp:  30 each, Rfl: 0   Omega-3 Fatty Acids (FISH OIL PO), Take 1 capsule by mouth daily. (Patient not taking: Reported on 04/23/2023), Disp: , Rfl:    Objective:     Vitals:   01/07/24 1416  BP: 110/60  Pulse: 84  SpO2: 95%  Height: 5' (1.524 m)      Body mass index is 24.22 kg/m.    Physical Exam:    General:  awake, alert oriented, no acute distress nontoxic Skin: no suspicious lesions or rashes Neuro:sensation intact and strength 5/5 with no deficits, no atrophy, normal  muscle tone Psych: No signs of anxiety, depression or other mood disorder Respiratory: Normal WOB.  No flail chest.  Bilateral knee: Crepitus present No swelling No deformity Neg fluid wave, joint milking ROM Flex 110, Ext 0 NTTP over the quad tendon, medial fem condyle, lat fem condyle, patella, plica, patella tendon, tibial tuberostiy, fibular head, posterior fossa, pes anserine bursa, gerdy's tubercle, medial jt line, lateral jt line Neg anterior and posterior drawer Neg lachman Neg sag sign Negative varus stress Negative valgus stress Negative McMurray  Gait slow with shortened steps, using rollator for support   Electronically signed by:  Marshall Skeeter D.[facility] 3:18 PM 01/07/24

## 2024-01-07 NOTE — Patient Instructions (Signed)
 Tylenol [phone] mg 2-3 times per day as needed for pain relief   Recommend speaking with PCP about loss of appetite and taste Also about nursing home recommendations

## 2024-01-11 ENCOUNTER — Encounter: Payer: Self-pay | Admitting: Sports Medicine

## 2024-01-12 ENCOUNTER — Encounter: Payer: Self-pay | Admitting: Sports Medicine

## 2024-01-15 DIAGNOSIS — M47814 Spondylosis without myelopathy or radiculopathy, thoracic region: Secondary | ICD-10-CM | POA: Diagnosis not present

## 2024-01-21 NOTE — Progress Notes (Signed)
 [facility] [address] [place] [address] Phone: [phone] Subjective:   Carol Woods am a scribe for Dr. Felipe Horton.   I'm seeing this patient by the request  of:  Zorita Hiss, NP  CC: Neck and upper back pain  [id number]  12/09/2023 Depending on patient's nerve conduction study will discussed with patient again at great length about potentially starting formal physical therapy again but would like to know how she responds to the interventions first.     Concern of worsening symptoms.  After much discussion patient has elected to try a C7-T1 epidural.  Hopeful that this will make some improvement.  Does have weakness noted and we do want to run the multiple myeloma panel as well to make sure nothing else is contributing.  Patient is grieving the loss of her daughter as well and has not been as active and so I do think deconditioning is playing a role.  Nerve conduction studies also added to further evaluate.  Patient will follow-up again in 6 weeks after the injection.  Patient is to be traveling to [place]  again for the ceremony for her daughter in May. Did discuss also would like her to follow-up with neurosurgery to know her other possibility with the adjacent segment disease at the C3-C4 with the moderate to severe spinal stenosis     Updated 01/22/2024 Carol Woods is a 89 y.o. female coming in with complaint of neck pain patient was sent for another epidural.  Last epidural was December 21, 2023.  Patient states neck is pretty good. Yesterday moved it in the wrong direction and felt it but for the most part it is ok. It was perfect for about four days. Not having severe pain anymore.   Since we have seen patient was seen by another provider 2 weeks ago and was found to have a rib fracture noted of the left ninth rib.    Past Medical History:  Diagnosis Date   Arthritis    "back; shoulders" (08/31/2014)   Atypical chest pain 11/21/2019    Cataracts, both eyes    Essential hypertension 07/13/2018   GERD (gastroesophageal reflux disease)    OTC, changed diet   High cholesterol    Hypertension    Hypothyroidism    Past Surgical History:  Procedure Laterality Date   ANTERIOR CERVICAL DECOMP/DISCECTOMY FUSION  02/2010   Dr Ellery Guthrie   BACK SURGERY     COLONOSCOPY W/ POLYPECTOMY     DILATION AND CURETTAGE OF UTERUS     POSTERIOR LUMBAR FUSION  04/2010   Dr Ellery Guthrie   TONSILLECTOMY     age 21   TOTAL SHOULDER ARTHROPLASTY Right 08/31/2014   TOTAL SHOULDER ARTHROPLASTY Right 08/31/2014   Procedure: TOTAL SHOULDER ARTHROPLASTY;  Surgeon: Derald Flattery, MD;  Location: [facility] OR;  Service: Orthopedics;  Laterality: Right;  Right total shoulder arthroplasty   Social History   Socioeconomic History   Marital status: Divorced    Spouse name: Not on file   Number of children: Not on file   Years of education: Not on file   Highest education level: Not on file  Occupational History   Not on file  Tobacco Use   Smoking status: Former    Current packs/day: 0.00    Average packs/day: 1.5 packs/day for 25.0 years (37.5 ttl pk-yrs)    Types: Cigarettes    Start date: 08/23/1959    Quit date: 08/22/1984    Years since  quitting: 39.4   Smokeless tobacco: Never  Vaping Use   Vaping status: Never Used  Substance and Sexual Activity   Alcohol  use: Yes    Alcohol /week: 12.0 standard drinks of alcohol     Types: 12 Glasses of wine per week   Drug use: No   Sexual activity: Not on file  Other Topics Concern   Not on file  Social History Narrative   Not on file   Social Drivers of Health   Financial Resource Strain: Low Risk  (05/14/2023)   Overall Financial Resource Strain (CARDIA)    Difficulty of Paying Living Expenses: Not hard at all  Food Insecurity: No Food Insecurity (05/14/2023)   Hunger Vital Sign    Worried About Running Out of Food in the Last Year: Never true    Ran Out of Food in the Last Year: Never true   Transportation Needs: No Transportation Needs (05/14/2023)   PRAPARE - Transportation    Lack of Transportation (Medical): No    Lack of Transportation (Non-Medical): No  Recent Concern: Transportation Needs - Unmet Transportation Needs (03/06/2023)   PRAPARE - Transportation    Lack of Transportation (Medical): Yes    Lack of Transportation (Non-Medical): Yes  Physical Activity: Sufficiently Active (05/14/2023)   Exercise Vital Sign    Days of Exercise per Week: 5 days    Minutes of Exercise per Session: 30 min  Stress: No Stress Concern Present (05/14/2023)   [employer] of Occupational Health - Occupational Stress Questionnaire    Feeling of Stress : Not at all  Social Connections: Moderately Isolated (05/14/2023)   Social Connection and Isolation Panel [NHANES]    Frequency of Communication with Friends and Family: More than three times a week    Frequency of Social Gatherings with Friends and Family: More than three times a week    Attends Religious Services: Never    Database administrator or Organizations: Yes    Attends Banker Meetings: Never    Marital Status: Divorced   No Known Allergies Family History  Problem Relation Age of Onset   Breast cancer Mother    Ovarian cancer Mother    Alzheimer's disease Mother    Parkinson's disease Father    Heart disease Father    Hypothyroidism Father    Hypertension Sister    Breast cancer Sister    Kidney disease Sister    Prostate cancer Brother    Heart attack Cousin    Colon cancer Neg Hx    Esophageal cancer Neg Hx    Inflammatory bowel disease Neg Hx    Liver disease Neg Hx    Pancreatic cancer Neg Hx    Rectal cancer Neg Hx    Stomach cancer Neg Hx     Current Outpatient Medications (Endocrine & Metabolic):    alendronate (FOSAMAX) 70 MG tablet, Take 1 tablet po q weekly as directed Oral   levothyroxine  (SYNTHROID ) 112 MCG tablet, Take 1 tablet (112 mcg total) by mouth daily before  breakfast.  Current Outpatient Medications (Cardiovascular):    amLODipine  (NORVASC ) 2.5 MG tablet, TAKE THREE TABLETS BY MOUTH IN THE EVENING FOR BLOOD PRESSURE 30 days   lisinopril  (ZESTRIL ) 20 MG tablet, Take 1 tablet (20 mg total) by mouth daily.  Current Outpatient Medications (Respiratory):    benzonatate  (TESSALON ) 100 MG capsule, Take 1 capsule (100 mg total) by mouth 2 (two) times daily as needed for cough.   Current Outpatient Medications (Hematological):    cyanocobalamin   1000 MCG tablet, Take 1,000 mcg by mouth daily.  Current Outpatient Medications (Other):    b complex vitamins capsule, Take 1 capsule by mouth daily.   CALCIUM PO, Take 1 tablet by mouth daily.   Cholecalciferol (VITAMIN D  PO), Take 1 tablet by mouth daily.   clindamycin (CLEOCIN T) 1 % external solution, Apply 1 application topically daily.    hydrocortisone 2.5 % cream, Apply 1 Application topically daily.   latanoprost (XALATAN) 0.005 % ophthalmic solution, Place 1 drop into both eyes at bedtime.   Magnesium 500 MG CAPS, Take 500 mg by mouth daily.   nirmatrelvir  & ritonavir  (PAXLOVID , 300/100,) 20 x 150 MG & 10 x 100MG  TBPK, Take 3 tablets by mouth 2 (two) times daily.   Omega-3 Fatty Acids (FISH OIL PO), Take 1 capsule by mouth daily.   Reviewed prior external information including notes and imaging from  primary care provider As well as notes that were available from care everywhere and other healthcare systems.  Past medical history, social, surgical and family history all reviewed in electronic medical record.  No pertanent information unless stated regarding to the chief complaint.   Review of Systems:  No headache, visual changes, nausea, vomiting, diarrhea, constipation, dizziness, abdominal pain, skin rash, fevers, chills, night sweats, weight loss, swollen lymph nodes, body aches, joint swelling, chest pain, shortness of breath, mood changes. POSITIVE muscle aches  Objective  Blood  pressure (!) 110/50, pulse 79, height 5' (1.524 m), weight 118 lb (53.5 kg), SpO2 97%.   General: No apparent distress alert and oriented x3 mood and affect normal, dressed appropriately.  HEENT: Pupils equal, extraocular movements intact  Respiratory: Patient's speak in full sentences and does not appear short of breath  Cardiovascular: No lower extremity edema, non tender, no erythema  Neck exam shows patient does have a significant improvement in some range of motion noted.  Patient still has some difficulty with grip strength noted.  Patient ambulates with the aid of a cane.  Has difficulty in the feet with range of motion especially left greater than right but does have some foot drop on the right.  Peripheral neuropathy noted.    Impression and Recommendations:    The above documentation has been reviewed and is accurate and complete Lawrnce Reyez M Halimah Bewick, DO

## 2024-01-22 ENCOUNTER — Ambulatory Visit: Admitting: Family Medicine

## 2024-01-22 VITALS — BP 110/50 | HR 79 | Ht 60.0 in | Wt 118.0 lb

## 2024-01-22 DIAGNOSIS — M4802 Spinal stenosis, cervical region: Secondary | ICD-10-CM | POA: Diagnosis not present

## 2024-01-22 DIAGNOSIS — R269 Unspecified abnormalities of gait and mobility: Secondary | ICD-10-CM | POA: Diagnosis not present

## 2024-01-22 NOTE — Patient Instructions (Signed)
 Good to see you. Start 12 magnesium smooth cream. Foot HEP. Follow up in 10 weeks. Write us  if you need an injection sooner.

## 2024-01-22 NOTE — Assessment & Plan Note (Signed)
 Given exercises.  Discussed which activities to do and which ones to avoid.  Getting some foot exercises.  Do think PT for balance and coordination could be beneficial but patient has had a lot of interventions recently.  Follow-up again in 2 to 3 months

## 2024-01-22 NOTE — Assessment & Plan Note (Signed)
 Severe cervical stenosis.  Did respond extremely well though to the epidural.  Still feeling approximately 70% better.  Discussed potentially repeating if necessary.  Continue follow-up with neurosurgery.  Discussed icing regimen.  No change in medications.  Follow-up again in 2 to 3 months

## 2024-01-25 ENCOUNTER — Ambulatory Visit: Admitting: Neurology

## 2024-01-25 DIAGNOSIS — G5603 Carpal tunnel syndrome, bilateral upper limbs: Secondary | ICD-10-CM

## 2024-01-25 DIAGNOSIS — R202 Paresthesia of skin: Secondary | ICD-10-CM

## 2024-01-25 DIAGNOSIS — M5412 Radiculopathy, cervical region: Secondary | ICD-10-CM

## 2024-01-25 NOTE — Procedures (Signed)
 [facility]  [address], [address]  [place], [place] [address] Tel: [phone] Fax: [phone] Test Date:  01/25/2024  Patient: Carol Woods DOB: [birth year] Physician: Rommie Coats, MD  Sex: Female Height: 5\' 0"  Ref Phys: Ronnell Coins, DO  ID#: [id number]   Technician:    History: This is an 89 year old female with neck and hand pain.  NCV & EMG Findings: Extensive electrodiagnostic evaluation of bilateral upper limbs shows: Right median sensory response is absent. Left median sensory response shows prolonged distal peak latency (4.1 ms). Bilateral ulnar and radial sensory responses are within normal limits. Right median (APB) motor response shows prolonged distal onset latency (4.7 ms). Left ulnar (ADM) motor response shows reduced amplitude (5.5 mV). Left median (APB) and right ulnar (ADM) motor responses are within normal limits. Chronic motor axon loss changes without accompanying active denervation changes are seen in bilateral pronator teres, bilateral triceps, left first dorsal interosseous, and left extensor indicis proprius muscles. Cervical paraspinal muscles were deferred due to reported history of prior cervical spine surgery.  Impression: This is an abnormal study. The findings are most consistent with the following: Bilateral median mononeuropathy at or distal to the wrist, consistent with carpal tunnel syndrome. The findings are severe in degree electrically on the right and mild in degree electrically on the left. 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 The findings are mild in degree electrically at bilateral C7 roots and moderate in degree electrically at left C8 root.    ___________________________ Rommie Coats, MD    Nerve Conduction Studies Motor Nerve Results    Latency Amplitude F-Lat Segment Distance CV Comment  Site (ms) Norm (mV) Norm (ms)  (cm) (m/s) Norm    Left Median (APB) Motor  Wrist 3.9  < 4.0 7.9  > 5.0        Elbow 8.7 - 7.4 -  Elbow-Wrist 29 60  > 50   Right Median (APB) Motor  Wrist *4.7  < 4.0 5.0  > 5.0        Elbow 9.9 - 4.7 -  Elbow-Wrist 26 50  > 50   Left Ulnar (ADM) Motor  Wrist 2.9  < 3.1 *5.5  > 7.0        Bel elbow 6.3 - 3.6 -  Bel elbow-Wrist 19 56  > 50   Ab elbow 8.2 - 3.4 -  Ab elbow-Bel elbow 10 53 -   Right Ulnar (ADM) Motor  Wrist 2.3  < 3.1 8.4  > 7.0        Bel elbow 5.5 - 7.3 -  Bel elbow-Wrist 19 59  > 50   Ab elbow 7.5 - 6.4 -  Ab elbow-Bel elbow 10 50 -    Sensory Sites    Neg Peak Lat Amplitude (O-P) Segment Distance Velocity Comment  Site (ms) Norm (V) Norm  (cm) (ms)   Left Median Sensory  Wrist-Dig II *4.1  < 3.8 13  > 10 Wrist-Dig II 13    Right Median Sensory  Wrist-Dig II *NR  < 3.8 *NR  > 10 Wrist-Dig II 13    Left Radial Sensory  Forearm-Wrist 2.8  < 2.8 11  > 10 Forearm-Wrist 10    Right Radial Sensory  Forearm-Wrist 2.2  < 2.8 10  > 10 Forearm-Wrist 10    Left Ulnar Sensory  Wrist-Dig V 3.1  < 3.2 5  > 5 Wrist-Dig  V 11    Right Ulnar Sensory  Wrist-Dig V 2.7  < 3.2 5  > 5 Wrist-Dig V 11     Electromyography   Side Muscle Ins.Act Fibs Fasc Recrt Amp Dur Poly Activation Comment  Right FDI Nml Nml Nml Nml Nml Nml Nml Nml N/A  Right EIP Nml Nml Nml Nml Nml Nml Nml Nml N/A  Right Pronator teres Nml Nml Nml *1- *1+ *1+ Nml Nml N/A  Right Biceps Nml Nml Nml Nml Nml Nml Nml Nml N/A  Right Triceps Nml Nml Nml *1- *1+ *1+ Nml Nml N/A  Right Deltoid Nml Nml Nml Nml Nml Nml Nml Nml N/A  Left FDI Nml Nml Nml *3- *1+ *2+ Nml Nml N/A  Left EIP Nml Nml Nml *3- *1+ *2+ Nml Nml N/A  Left Pronator teres *CRD Nml Nml *1- *1+ *1+ Nml Nml N/A  Left Biceps Nml Nml Nml Nml Nml Nml Nml Nml N/A  Left Triceps Nml Nml Nml *2- *1+ *1+ Nml Nml N/A  Left Deltoid Nml Nml Nml Nml Nml Nml Nml Nml N/A      Waveforms:  Motor           Sensory

## 2024-01-26 ENCOUNTER — Encounter: Payer: Self-pay | Admitting: Family Medicine

## 2024-01-29 ENCOUNTER — Telehealth: Payer: Self-pay | Admitting: Neurology

## 2024-01-29 NOTE — Telephone Encounter (Signed)
 Patient called to let Dr.Hill know, Dr.Henry Elsner would like them to fax them a copy of the results of her EMG.

## 2024-01-29 NOTE — Telephone Encounter (Signed)
 Emg results sent to Dr. Ellery Guthrie

## 2024-02-01 DIAGNOSIS — N952 Postmenopausal atrophic vaginitis: Secondary | ICD-10-CM | POA: Diagnosis not present

## 2024-02-01 DIAGNOSIS — Z1331 Encounter for screening for depression: Secondary | ICD-10-CM | POA: Diagnosis not present

## 2024-02-01 DIAGNOSIS — F419 Anxiety disorder, unspecified: Secondary | ICD-10-CM | POA: Diagnosis not present

## 2024-02-01 DIAGNOSIS — Z124 Encounter for screening for malignant neoplasm of cervix: Secondary | ICD-10-CM | POA: Diagnosis not present

## 2024-02-01 DIAGNOSIS — M810 Age-related osteoporosis without current pathological fracture: Secondary | ICD-10-CM | POA: Diagnosis not present

## 2024-02-01 DIAGNOSIS — Z1231 Encounter for screening mammogram for malignant neoplasm of breast: Secondary | ICD-10-CM | POA: Diagnosis not present

## 2024-02-01 LAB — HM MAMMOGRAPHY

## 2024-02-10 ENCOUNTER — Other Ambulatory Visit: Payer: Self-pay | Admitting: Nurse Practitioner

## 2024-02-10 DIAGNOSIS — F4320 Adjustment disorder, unspecified: Secondary | ICD-10-CM | POA: Diagnosis not present

## 2024-02-16 DIAGNOSIS — L821 Other seborrheic keratosis: Secondary | ICD-10-CM | POA: Diagnosis not present

## 2024-02-16 DIAGNOSIS — Z85828 Personal history of other malignant neoplasm of skin: Secondary | ICD-10-CM | POA: Diagnosis not present

## 2024-02-16 DIAGNOSIS — D225 Melanocytic nevi of trunk: Secondary | ICD-10-CM | POA: Diagnosis not present

## 2024-02-16 DIAGNOSIS — G609 Hereditary and idiopathic neuropathy, unspecified: Secondary | ICD-10-CM | POA: Diagnosis not present

## 2024-02-16 DIAGNOSIS — L814 Other melanin hyperpigmentation: Secondary | ICD-10-CM | POA: Diagnosis not present

## 2024-03-15 ENCOUNTER — Telehealth: Payer: Self-pay | Admitting: Family Medicine

## 2024-03-15 NOTE — Telephone Encounter (Signed)
 Patient called and stated that she fell last Wednesday and she thought she was okay but her knee is buckling and she has a sore spot on her right heel. She does not know how she fell. She does not know if she broke anything. She does not know if she should come or what she should do. She feels some soreness in her ribs every now and then. She states what she remembers before she fell is that she put a bowl on her table and a cup on her table and reached to pull her chair out and she found herself on the floor she does not know how she fell or what happened. Patient is scheduled for 7:45 this Thursday. She states she can make that time work. Please confirm if this is the best time for her to come in to see Dr. Claudene. Please advise.

## 2024-03-16 NOTE — Telephone Encounter (Signed)
 Left message for patient to call back to see how she is doing. Recommend that she keep appt for 1:30pm 03/17/2024.

## 2024-03-17 ENCOUNTER — Encounter: Payer: Self-pay | Admitting: Family Medicine

## 2024-03-17 ENCOUNTER — Ambulatory Visit (INDEPENDENT_AMBULATORY_CARE_PROVIDER_SITE_OTHER)

## 2024-03-17 ENCOUNTER — Ambulatory Visit: Admitting: Family Medicine

## 2024-03-17 ENCOUNTER — Other Ambulatory Visit: Payer: Self-pay

## 2024-03-17 VITALS — BP 120/62 | HR 73 | Ht 60.0 in | Wt 119.0 lb

## 2024-03-17 DIAGNOSIS — M1712 Unilateral primary osteoarthritis, left knee: Secondary | ICD-10-CM

## 2024-03-17 DIAGNOSIS — M25562 Pain in left knee: Secondary | ICD-10-CM | POA: Diagnosis not present

## 2024-03-17 DIAGNOSIS — S2242XD Multiple fractures of ribs, left side, subsequent encounter for fracture with routine healing: Secondary | ICD-10-CM | POA: Diagnosis not present

## 2024-03-17 DIAGNOSIS — R0781 Pleurodynia: Secondary | ICD-10-CM

## 2024-03-17 DIAGNOSIS — M21372 Foot drop, left foot: Secondary | ICD-10-CM | POA: Diagnosis not present

## 2024-03-17 DIAGNOSIS — L853 Xerosis cutis: Secondary | ICD-10-CM

## 2024-03-17 DIAGNOSIS — M25561 Pain in right knee: Secondary | ICD-10-CM

## 2024-03-17 MED ORDER — TRIAMCINOLONE ACETONIDE 0.5 % EX CREA: 1.0000 | TOPICAL_CREAM | Freq: Two times a day (BID) | CUTANEOUS | 3 refills | Status: AC

## 2024-03-17 MED ORDER — AMBULATORY NON FORMULARY MEDICATION
1.0000 [IU] | Freq: Once | 0 refills | Status: AC
Start: 2024-03-17 — End: 2024-03-17

## 2024-03-17 NOTE — Assessment & Plan Note (Signed)
 Left foot drop, continuing to be her difficulty in causing the more difficulty with her walking and daily activities.  I do believe an AFO would be significantly beneficial for this individual.  Will try to get this ordered.  Have ordered previously and has never gotten it done.  Patient understands the importance of this because if she did fall and break a hip we would have significant difficulty.  Follow-up with me again in 2 months otherwise.

## 2024-03-17 NOTE — Assessment & Plan Note (Signed)
 Arthritis noted, discussed HEP  Discussed bracing and increase activity  RTC in 6-8 weeks and would consider injection

## 2024-03-17 NOTE — Assessment & Plan Note (Signed)
 Dry skin around the healing given a topical steroid which I think will be beneficial for this individual.  Discussed avoiding some abrasive with use or anything with smells in it.  Follow-up again in 6 to 8 weeks otherwise.

## 2024-03-17 NOTE — Progress Notes (Signed)
 [facility] [address] [place] [address] Phone: [phone] Subjective:   LILLETTE Berwyn Posey, am serving as a scribe for Dr. Arthea Claudene.  I'm seeing this patient by the request  of:  [name], NP  CC: Left knee pain, right heel pain  [name]  SHIRLEYMAE HAUTH is a 89 y.o. female coming in with complaint of L knee pain. Patient states she suffered a fall 2 days ago. Was pulling out chair and lost her balance. Unsure what caused her fall. After sitting for a while she notices catching in L knee.   Also having pain over lateral calcaneous of R foot. Covers lying on her ankle cause her pain.   R shoulder and neck also sore from fall but she does state that it was sore prior to fall.      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   TOTAL SHOULDER ARTHROPLASTY Right 08/31/2014   Procedure: TOTAL SHOULDER ARTHROPLASTY;  Surgeon: Eva Elsie Herring, MD;  Location: [facility] OR;  Service: Orthopedics;  Laterality: Right;  Right total shoulder arthroplasty   Social History   Socioeconomic History   Marital status: Divorced    Spouse name: Not on file   Number of children: Not on file   Years of education: Not on file   Highest education level: Not on file  Occupational History   Not on file  Tobacco Use   Smoking status: Former    Current packs/day: 0.00     Average packs/day: 1.5 packs/day for 25.0 years (37.5 ttl pk-yrs)    Types: Cigarettes    Start date: 08/23/1959    Quit date: 08/22/1984    Years since quitting: 39.5   Smokeless tobacco: Never  Vaping Use   Vaping status: Never Used  Substance and Sexual Activity   Alcohol  use: Yes    Alcohol /week: 12.0 standard drinks of alcohol     Types: 12 Glasses of wine per week   Drug use: No   Sexual activity: Not on file  Other Topics Concern   Not on file  Social History Narrative   Not on file   Social Drivers of Health   Financial Resource Strain: Low Risk  (05/14/2023)   Overall Financial Resource Strain (CARDIA)    Difficulty of Paying Living Expenses: Not hard at all  Food Insecurity: No Food Insecurity (05/14/2023)   Hunger Vital Sign    Worried About Running Out of Food in the Last Year: Never true    Ran Out of Food in the Last Year: Never true  Transportation Needs: No Transportation Needs (05/14/2023)   PRAPARE - Transportation    Lack of Transportation (Medical): No    Lack of Transportation (Non-Medical): No  Recent Concern: Transportation Needs - Unmet Transportation Needs (  03/06/2023)   PRAPARE - Transportation    Lack of Transportation (Medical): Yes    Lack of Transportation (Non-Medical): Yes  Physical Activity: Sufficiently Active (05/14/2023)   Exercise Vital Sign    Days of Exercise per Week: 5 days    Minutes of Exercise per Session: 30 min  Stress: No Stress Concern Present (05/14/2023)   [employer] of Occupational Health - Occupational Stress Questionnaire    Feeling of Stress : Not at all  Social Connections: Moderately Isolated (05/14/2023)   Social Connection and Isolation Panel    Frequency of Communication with Friends and Family: More than three times a week    Frequency of Social Gatherings with Friends and Family: More than three times a week    Attends Religious Services: Never    Database administrator or Organizations: Yes    Attends Tax inspector Meetings: Never    Marital Status: Divorced   No Known Allergies Family History  Problem Relation Age of Onset   Breast cancer Mother    Ovarian cancer Mother    Alzheimer's disease Mother    Parkinson's disease Father    Heart disease Father    Hypothyroidism Father    Hypertension Sister    Breast cancer Sister    Kidney disease Sister    Prostate cancer Brother    Heart attack Cousin    Colon cancer Neg Hx    Esophageal cancer Neg Hx    Inflammatory bowel disease Neg Hx    Liver disease Neg Hx    Pancreatic cancer Neg Hx    Rectal cancer Neg Hx    Stomach cancer Neg Hx     Current Outpatient Medications (Endocrine & Metabolic):    alendronate (FOSAMAX) 70 MG tablet, Take 1 tablet po q weekly as directed Oral   levothyroxine  (SYNTHROID ) 112 MCG tablet, Take 1 tablet (112 mcg total) by mouth daily before breakfast.  Current Outpatient Medications (Cardiovascular):    amLODipine  (NORVASC ) 2.5 MG tablet, TAKE THREE TABLETS BY MOUTH IN THE EVENING FOR BLOOD PRESSURE 30 days   lisinopril  (ZESTRIL ) 20 MG tablet, Take 1 tablet (20 mg total) by mouth daily.  Current Outpatient Medications (Respiratory):    benzonatate  (TESSALON ) 100 MG capsule, Take 1 capsule (100 mg total) by mouth 2 (two) times daily as needed for cough.   Current Outpatient Medications (Hematological):    cyanocobalamin  1000 MCG tablet, Take 1,000 mcg by mouth daily.  Current Outpatient Medications (Other):    AMBULATORY NON FORMULARY MEDICATION, 1 Units by Other route once for 1 dose.   b complex vitamins capsule, Take 1 capsule by mouth daily.   CALCIUM PO, Take 1 tablet by mouth daily.   Cholecalciferol (VITAMIN D  PO), Take 1 tablet by mouth daily.   clindamycin (CLEOCIN T) 1 % external solution, Apply 1 application topically daily.    hydrocortisone 2.5 % cream, Apply 1 Application topically daily.   latanoprost (XALATAN) 0.005 % ophthalmic solution, Place 1 drop into both eyes at  bedtime.   Magnesium 500 MG CAPS, Take 500 mg by mouth daily.   nirmatrelvir  & ritonavir  (PAXLOVID , 300/100,) 20 x 150 MG & 10 x 100MG  TBPK, Take 3 tablets by mouth 2 (two) times daily.   Omega-3 Fatty Acids (FISH OIL PO), Take 1 capsule by mouth daily.   triamcinolone  cream (KENALOG ) 0.5 %, Apply 1 Application topically 2 (two) times daily. To affected areas.   Reviewed prior external information including notes and imaging from  primary care provider As well as notes that were available from care everywhere and other healthcare systems.  Past medical history, social, surgical and family history all reviewed in electronic medical record.  No pertanent information unless stated regarding to the chief complaint.   Review of Systems:  No headache, visual changes, nausea, vomiting, diarrhea, constipation, dizziness, abdominal pain, skin rash, fevers, chills, night sweats, weight loss, swollen lymph nodes, body aches, joint swelling, chest pain, shortness of breath, mood changes. POSITIVE muscle aches  Objective  Blood pressure 120/62, pulse 73, height 5' (1.524 m), weight 119 lb (54 kg), SpO2 98%.   General: No apparent distress alert and oriented x3 mood and affect normal, dressed appropriately.  HEENT: Pupils equal, extraocular movements intact  Respiratory: Patient's speak in full sentences and does not appear short of breath  Cardiovascular: 1+ Of the left knee does have a trace effusion noted but nothing severe.  No specific findings of significant instability. Severely antalgic gait walking with the aid of a cane. Left foot drop noted fairly significant.  Limited muscular skeletal ultrasound was performed and interpreted by CLAUDENE HUSSAR, M  Limited ultrasound shows that there is some hypoechoic changes in the patellofemoral joint but more in the popliteal area and it seems to be consistent with a ruptured Baker's cyst.  Questionable overlying hamstring injury also noted.    Impression and Recommendations:     The above documentation has been reviewed and is accurate and complete Brennden Masten M Kienan Doublin, DO

## 2024-03-17 NOTE — Patient Instructions (Signed)
 AFO L foot Knee compression Hamstring exercises Kenalog  cream See me in 8 weeks

## 2024-03-21 ENCOUNTER — Telehealth: Payer: Self-pay | Admitting: Family Medicine

## 2024-03-21 NOTE — Telephone Encounter (Signed)
 Patient called and is having pain in her neck on the right side as well as her shoulder into her back. The pain started yesterday and it has not gone away. She would like to get an appointment to see him as soon as she can. Please advise.

## 2024-03-27 ENCOUNTER — Ambulatory Visit: Payer: Self-pay | Admitting: Family Medicine

## 2024-03-30 NOTE — Progress Notes (Unsigned)
 [facility] [address] [place] [address] Phone: [phone] Subjective:   [name], am serving as a scribe for Dr. Arthea Claudene.  I'm seeing this patient by the request  of:  [name], NP  CC: Back pain, leg pain  [name]  03/17/2024 Dry skin around the healing given a topical steroid which I think will be beneficial for this individual.  Discussed avoiding some abrasive with use or anything with smells in it.  Follow-up again in 6 to 8 weeks otherwise.     Arthritis noted, discussed HEP  Discussed bracing and increase activity  RTC in 6-8 weeks and would consider injection      Left foot drop, continuing to be her difficulty in causing the more difficulty with her walking and daily activities.  I do believe an AFO would be significantly beneficial for this individual.  Will try to get this ordered.  Have ordered previously and has never gotten it done.  Patient understands the importance of this because if she did fall and break a hip we would have significant difficulty.  Follow-up with me again in 2 months otherwise.     Updated 03/31/2024 Carol Woods is a 89 y.o. female coming in with complaint of L knee and foot drop. Thinks she's here to check ribs, and follow up on neck and foot pain. Neck is a little sore, but livable now. Hole in R foot check.        Past Medical History:  Diagnosis Date   Arthritis    back; shoulders (08/31/2014)   Atypical chest pain 11/21/2019   Cataracts, both eyes    Essential hypertension 07/13/2018   GERD (gastroesophageal reflux disease)    OTC, changed diet   High cholesterol    Hypertension    Hypothyroidism    Past Surgical History:  Procedure Laterality Date   ANTERIOR CERVICAL DECOMP/DISCECTOMY FUSION  02/2010   Dr Colon   BACK SURGERY     COLONOSCOPY W/ POLYPECTOMY     DILATION AND CURETTAGE OF UTERUS     POSTERIOR LUMBAR FUSION  04/2010   Dr Colon    TONSILLECTOMY     age 48   TOTAL SHOULDER ARTHROPLASTY Right 08/31/2014   TOTAL SHOULDER ARTHROPLASTY Right 08/31/2014   Procedure: TOTAL SHOULDER ARTHROPLASTY;  Surgeon: Eva Elsie Herring, MD;  Location: [facility] OR;  Service: Orthopedics;  Laterality: Right;  Right total shoulder arthroplasty   Social History   Socioeconomic History   Marital status: Divorced    Spouse name: Not on file   Number of children: Not on file   Years of education: Not on file   Highest education level: Not on file  Occupational History   Not on file  Tobacco Use   Smoking status: Former    Current packs/day: 0.00    Average packs/day: 1.5 packs/day for 25.0 years (37.5 ttl pk-yrs)    Types: Cigarettes    Start date: 08/23/1959    Quit date: 08/22/1984    Years since quitting: 39.6   Smokeless tobacco: Never  Vaping Use   Vaping status: Never Used  Substance and Sexual Activity   Alcohol  use: Yes    Alcohol /week: 12.0 standard drinks of alcohol     Types: 12 Glasses of wine per week   Drug use: No   Sexual activity: Not on file  Other Topics Concern   Not on file  Social History Narrative   Not on file  Social Drivers of Corporate investment banker Strain: Low Risk  (05/14/2023)   Overall Financial Resource Strain (CARDIA)    Difficulty of Paying Living Expenses: Not hard at all  Food Insecurity: No Food Insecurity (05/14/2023)   Hunger Vital Sign    Worried About Running Out of Food in the Last Year: Never true    Ran Out of Food in the Last Year: Never true  Transportation Needs: No Transportation Needs (05/14/2023)   PRAPARE - Administrator, Civil Service (Medical): No    Lack of Transportation (Non-Medical): No  Recent Concern: Transportation Needs - Unmet Transportation Needs (03/06/2023)   PRAPARE - Transportation    Lack of Transportation (Medical): Yes    Lack of Transportation (Non-Medical): Yes  Physical Activity: Sufficiently Active (05/14/2023)   Exercise Vital Sign     Days of Exercise per Week: 5 days    Minutes of Exercise per Session: 30 min  Stress: No Stress Concern Present (05/14/2023)   [employer] of Occupational Health - Occupational Stress Questionnaire    Feeling of Stress : Not at all  Social Connections: Moderately Isolated (05/14/2023)   Social Connection and Isolation Panel    Frequency of Communication with Friends and Family: More than three times a week    Frequency of Social Gatherings with Friends and Family: More than three times a week    Attends Religious Services: Never    Database administrator or Organizations: Yes    Attends Banker Meetings: Never    Marital Status: Divorced   No Known Allergies Family History  Problem Relation Age of Onset   Breast cancer Mother    Ovarian cancer Mother    Alzheimer's disease Mother    Parkinson's disease Father    Heart disease Father    Hypothyroidism Father    Hypertension Sister    Breast cancer Sister    Kidney disease Sister    Prostate cancer Brother    Heart attack Cousin    Colon cancer Neg Hx    Esophageal cancer Neg Hx    Inflammatory bowel disease Neg Hx    Liver disease Neg Hx    Pancreatic cancer Neg Hx    Rectal cancer Neg Hx    Stomach cancer Neg Hx     Current Outpatient Medications (Endocrine & Metabolic):    alendronate (FOSAMAX) 70 MG tablet, Take 1 tablet po q weekly as directed Oral   levothyroxine  (SYNTHROID ) 112 MCG tablet, Take 1 tablet (112 mcg total) by mouth daily before breakfast.  Current Outpatient Medications (Cardiovascular):    amLODipine  (NORVASC ) 2.5 MG tablet, TAKE THREE TABLETS BY MOUTH IN THE EVENING FOR BLOOD PRESSURE 30 days   lisinopril  (ZESTRIL ) 20 MG tablet, Take 1 tablet (20 mg total) by mouth daily.  Current Outpatient Medications (Respiratory):    benzonatate  (TESSALON ) 100 MG capsule, Take 1 capsule (100 mg total) by mouth 2 (two) times daily as needed for cough.   Current Outpatient Medications  (Hematological):    cyanocobalamin  1000 MCG tablet, Take 1,000 mcg by mouth daily.  Current Outpatient Medications (Other):    b complex vitamins capsule, Take 1 capsule by mouth daily.   CALCIUM PO, Take 1 tablet by mouth daily.   Cholecalciferol (VITAMIN D  PO), Take 1 tablet by mouth daily.   clindamycin (CLEOCIN T) 1 % external solution, Apply 1 application topically daily.    hydrocortisone 2.5 % cream, Apply 1 Application topically daily.   latanoprost (  XALATAN) 0.005 % ophthalmic solution, Place 1 drop into both eyes at bedtime.   Magnesium 500 MG CAPS, Take 500 mg by mouth daily.   nirmatrelvir  & ritonavir  (PAXLOVID , 300/100,) 20 x 150 MG & 10 x 100MG  TBPK, Take 3 tablets by mouth 2 (two) times daily.   Omega-3 Fatty Acids (FISH OIL PO), Take 1 capsule by mouth daily.   triamcinolone  cream (KENALOG ) 0.5 %, Apply 1 Application topically 2 (two) times daily. To affected areas.   Reviewed prior external information including notes and imaging from  primary care provider As well as notes that were available from care everywhere and other healthcare systems.  Objective  Blood pressure 126/64, pulse 72, height 5' (1.524 m), SpO2 99%.   General: No apparent distress alert and oriented x3 mood and affect normal, dressed appropriately.  HEENT: Pupils equal, extraocular movements intact  Respiratory: Patient's speak in full sentences and does not appear short of breath  Cardiovascular: No lower extremity edema, non tender, no erythema  But does have some loss lordosis.  Some tenderness to patient in the paraspinal musculature.  As noted on the right side of the parascapular area. Patient's right heel still has some dry skin noted that is still severely tender to even light palpation.  No erythema in the area.  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

## 2024-03-31 ENCOUNTER — Ambulatory Visit: Admitting: Family Medicine

## 2024-03-31 ENCOUNTER — Encounter: Payer: Self-pay | Admitting: Family Medicine

## 2024-03-31 VITALS — BP 126/64 | HR 72 | Ht 60.0 in

## 2024-03-31 DIAGNOSIS — M1712 Unilateral primary osteoarthritis, left knee: Secondary | ICD-10-CM | POA: Diagnosis not present

## 2024-03-31 DIAGNOSIS — M5104 Intervertebral disc disorders with myelopathy, thoracic region: Secondary | ICD-10-CM | POA: Diagnosis not present

## 2024-03-31 DIAGNOSIS — L853 Xerosis cutis: Secondary | ICD-10-CM

## 2024-03-31 NOTE — Patient Instructions (Signed)
 Good to see you! Lather the foot with the cream Then cover with band aid for next 2 weeks Keep appointment, can move it back a month if doing well

## 2024-03-31 NOTE — Assessment & Plan Note (Signed)
 On patient's heel, discussed with patient about the triamcinolone  and covering it with a Band-Aid for the next 2 weeks.  I do believe the patient does have some neuropathy that is contributing to this.  Patient is to monitor.  Does not wear socks with her sandals.  Follow-up with me as scheduled on the 21st otherwise.

## 2024-03-31 NOTE — Assessment & Plan Note (Signed)
 Continue to monitor.  Did have the broken ribs but is healing appropriately.  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  Seems to be improving and will start to increase activity as tolerated follow-up with me again if needed in 2 to 3 months if doing well otherwise has an appointment in 2 weeks

## 2024-03-31 NOTE — Assessment & Plan Note (Signed)
 Stable, will continue to monitor but seems to be doing relatively well.

## 2024-04-08 ENCOUNTER — Encounter: Payer: Self-pay | Admitting: Advanced Practice Midwife

## 2024-04-08 DIAGNOSIS — M4712 Other spondylosis with myelopathy, cervical region: Secondary | ICD-10-CM | POA: Diagnosis not present

## 2024-04-11 ENCOUNTER — Other Ambulatory Visit: Payer: Self-pay | Admitting: Neurological Surgery

## 2024-04-11 DIAGNOSIS — M4712 Other spondylosis with myelopathy, cervical region: Secondary | ICD-10-CM

## 2024-04-12 ENCOUNTER — Encounter: Payer: Self-pay | Admitting: Neurological Surgery

## 2024-04-24 ENCOUNTER — Ambulatory Visit
Admission: RE | Admit: 2024-04-24 | Discharge: 2024-04-24 | Disposition: A | Discharge status: Home / Self Care | Source: Ambulatory Visit | Attending: Neurological Surgery | Admitting: Neurological Surgery

## 2024-04-24 DIAGNOSIS — M4722 Other spondylosis with radiculopathy, cervical region: Secondary | ICD-10-CM | POA: Diagnosis not present

## 2024-04-24 DIAGNOSIS — M4712 Other spondylosis with myelopathy, cervical region: Secondary | ICD-10-CM

## 2024-04-24 DIAGNOSIS — M4802 Spinal stenosis, cervical region: Secondary | ICD-10-CM | POA: Diagnosis not present

## 2024-04-29 ENCOUNTER — Ambulatory Visit

## 2024-04-29 VITALS — BP 128/70 | HR 60 | Ht 61.0 in | Wt 121.2 lb

## 2024-04-29 DIAGNOSIS — Z0000 Encounter for general adult medical examination without abnormal findings: Secondary | ICD-10-CM

## 2024-04-29 NOTE — Patient Instructions (Addendum)
 Ms. Daniely , Thank you for taking time out of your busy schedule to complete your Annual Wellness Visit with me. I enjoyed our conversation and look forward to speaking with you again next year. I, as well as your care team,  appreciate your ongoing commitment to your health goals. Please review the following plan we discussed and let me know if I can assist you in the future. Your Game plan/ To Do List    Referrals: If you haven't heard from the office you've been referred to, please reach out to them at the phone provided.   Follow up Visits: We will see or speak with you next year for your Next Medicare AWV with our clinical staff Have you seen your provider in the last 6 months (3 months if uncontrolled diabetes)? No Last OV 03/06/2023.  Patient has a GYN that she sees every year for her screenings.  Clinician Recommendations:  Aim for 30 minutes of exercise or brisk walking, 6-8 glasses of water, and 5 servings of fruits and vegetables each day. Keep up the good work.      This is a list of the screenings recommended for you:  Health Maintenance  Topic Date Due   COVID-19 Vaccine (4 - 2024-25 season) 05/24/2023   Flu Shot  04/22/2024   Medicare Annual Wellness Visit  05/13/2024   DTaP/Tdap/Td vaccine (2 - Td or Tdap) 11/16/2028   Pneumococcal Vaccine for age over 2  Completed   DEXA scan (bone density measurement)  Completed   Zoster (Shingles) Vaccine  Completed   Hepatitis B Vaccine  Aged Out   HPV Vaccine  Aged Out   Meningitis B Vaccine  Aged Out    Advanced directives: (Copy Requested) Please bring a copy of your health care power of attorney and living will to the office to be added to your chart at your convenience. You can mail to [facility] [address] [place], [place] [address] or email to [email or web address] Advance Care Planning is important because it:  [x]  Makes sure you receive the medical care that is consistent with your values, goals,  and preferences  [x]  It provides guidance to your family and loved ones and reduces their decisional burden about whether or not they are making the right decisions based on your wishes.  Follow the link provided in your after visit summary or read over the paperwork we have mailed to you to help you started getting your Advance Directives in place. If you need assistance in completing these, please reach out to us  so that we can help you!  See attachments for Preventive Care and Fall Prevention Tips.

## 2024-04-29 NOTE — Progress Notes (Signed)
 Subjective:   Carol Woods is a 89 y.o. who presents for a Medicare Wellness preventive visit.  As a reminder, Annual Wellness Visits don't include a physical exam, and some assessments may be limited, especially if this visit is performed virtually. We may recommend an in-person follow-up visit with your provider if needed.  Visit Complete: In person  VideoDeclined- This patient declined Interactive audio and Acupuncturist. Therefore the visit was completed with audio only.  Persons Participating in Visit: Patient.  AWV Questionnaire: Yes: Patient Medicare AWV questionnaire was completed by the patient on 04/28/2024; I have confirmed that all information answered by patient is correct and no changes since this date.  Cardiac Risk Factors include: advanced age (>5men, >45 women);hypertension;dyslipidemia     Objective:    Today's Vitals   04/29/24 1505  BP: 128/70  Pulse: 60  SpO2: 97%  Weight: 121 lb 3.2 oz (55 kg)  Height: 5' 1 (1.549 m)   Body mass index is 22.9 kg/m.     04/29/2024    3:12 PM 09/08/2023   11:54 AM 05/14/2023    2:04 PM 01/31/2023   11:54 AM 12/16/2022    2:09 PM 11/23/2017    9:14 AM 08/31/2014    6:16 PM  Advanced Directives  Does Patient Have a Medical Advance Directive? Yes Yes Yes No Yes Yes  Yes   Type of Estate agent of [place];Living will Living will Healthcare Power of [place];Living will  Healthcare Power of [place];Living will Healthcare Power of [place];Living will Healthcare Power of [place];Living will   Does patient want to make changes to medical advance directive?     No - Patient declined  No - Patient declined   Copy of Healthcare Power of Attorney in Chart? No - copy requested  No - copy requested  No - copy requested  Yes   Would patient like information on creating a medical advance directive?    No - Patient declined        Data saved with a previous flowsheet row definition    Current  Medications (verified) Outpatient Encounter Medications as of 04/29/2024  Medication Sig   alendronate (FOSAMAX) 70 MG tablet Take 1 tablet po q weekly as directed Oral   amLODipine  (NORVASC ) 2.5 MG tablet TAKE THREE TABLETS BY MOUTH IN THE EVENING FOR BLOOD PRESSURE 30 days   b complex vitamins capsule Take 1 capsule by mouth daily.   benzonatate  (TESSALON ) 100 MG capsule Take 1 capsule (100 mg total) by mouth 2 (two) times daily as needed for cough.   CALCIUM PO Take 1 tablet by mouth daily.   Cholecalciferol (VITAMIN D  PO) Take 1 tablet by mouth daily.   clindamycin (CLEOCIN T) 1 % external solution Apply 1 application topically daily.    cyanocobalamin  1000 MCG tablet Take 1,000 mcg by mouth daily.   hydrocortisone 2.5 % cream Apply 1 Application topically daily.   latanoprost (XALATAN) 0.005 % ophthalmic solution Place 1 drop into both eyes at bedtime.   levothyroxine  (SYNTHROID ) 112 MCG tablet Take 1 tablet (112 mcg total) by mouth daily before breakfast.   lisinopril  (ZESTRIL ) 20 MG tablet Take 1 tablet (20 mg total) by mouth daily.   Magnesium 500 MG CAPS Take 500 mg by mouth daily.   nirmatrelvir  & ritonavir  (PAXLOVID , 300/100,) 20 x 150 MG & 10 x 100MG  TBPK Take 3 tablets by mouth 2 (two) times daily.   Omega-3 Fatty Acids (FISH OIL PO) Take 1 capsule by  mouth daily.   triamcinolone  cream (KENALOG ) 0.5 % Apply 1 Application topically 2 (two) times daily. To affected areas.   No facility-administered encounter medications on file as of 04/29/2024.    Allergies (verified) Patient has no known allergies.   History: Past Medical History:  Diagnosis Date   Arthritis    back; shoulders (08/31/2014)   Atypical chest pain 11/21/2019   Essential hypertension 07/13/2018   GERD (gastroesophageal reflux disease)    OTC, changed diet   High cholesterol    History of cataract surgery 02/05/2015   2nd eye done 07/23/2015   Hypertension    Hypothyroidism    Past Surgical History:   Procedure Laterality Date   ANTERIOR CERVICAL DECOMP/DISCECTOMY FUSION  02/2010   Dr Colon   BACK SURGERY     COLONOSCOPY W/ POLYPECTOMY     DILATION AND CURETTAGE OF UTERUS     POSTERIOR LUMBAR FUSION  04/2010   Dr Colon   TONSILLECTOMY     age 27   TOTAL SHOULDER ARTHROPLASTY Right 08/31/2014   TOTAL SHOULDER ARTHROPLASTY Right 08/31/2014   Procedure: TOTAL SHOULDER ARTHROPLASTY;  Surgeon: Eva Elsie Herring, MD;  Location: [facility] OR;  Service: Orthopedics;  Laterality: Right;  Right total shoulder arthroplasty   Family History  Problem Relation Age of Onset   Breast cancer Mother    Ovarian cancer Mother    Alzheimer's disease Mother    Parkinson's disease Father    Heart disease Father    Hypothyroidism Father    Hypertension Sister    Breast cancer Sister    Kidney disease Sister    Prostate cancer Brother    Heart attack Cousin    Colon cancer Neg Hx    Esophageal cancer Neg Hx    Inflammatory bowel disease Neg Hx    Liver disease Neg Hx    Pancreatic cancer Neg Hx    Rectal cancer Neg Hx    Stomach cancer Neg Hx    Social History   Socioeconomic History   Marital status: Divorced    Spouse name: Not on file   Number of children: Not on file   Years of education: Not on file   Highest education level: Master's degree (e.g., MA, MS, MEng, MEd, MSW, MBA)  Occupational History   Occupation: RETIRED  Tobacco Use   Smoking status: Former    Current packs/day: 0.00    Average packs/day: 1.5 packs/day for 25.0 years (37.5 ttl pk-yrs)    Types: Cigarettes    Start date: 08/23/1959    Quit date: 08/22/1984    Years since quitting: 39.7   Smokeless tobacco: Never  Vaping Use   Vaping status: Never Used  Substance and Sexual Activity   Alcohol  use: Not Currently    Comment: occasionally/ wine and mixed drinks   Drug use: No   Sexual activity: Not on file  Other Topics Concern   Not on file  Social History Narrative   Lives alone and has a cat/2025   Social  Drivers of Health   Financial Resource Strain: Low Risk  (04/28/2024)   Overall Financial Resource Strain (CARDIA)    Difficulty of Paying Living Expenses: Not hard at all  Food Insecurity: No Food Insecurity (04/28/2024)   Hunger Vital Sign    Worried About Running Out of Food in the Last Year: Never true    Ran Out of Food in the Last Year: Never true  Transportation Needs: No Transportation Needs (04/28/2024)   PRAPARE - Transportation  Lack of Transportation (Medical): No    Lack of Transportation (Non-Medical): No  Physical Activity: Inactive (04/28/2024)   Exercise Vital Sign    Days of Exercise per Week: 0 days    Minutes of Exercise per Session: Not on file  Stress: Stress Concern Present (04/29/2024)   [employer] of Occupational Health - Occupational Stress Questionnaire    Feeling of Stress: Rather much  Social Connections: Unknown (04/28/2024)   Social Connection and Isolation Panel    Frequency of Communication with Friends and Family: Not on file    Frequency of Social Gatherings with Friends and Family: Not on file    Attends Religious Services: More than 4 times per year    Active Member of [employer] or Organizations: Yes    Attends Engineer, structural: More than 4 times per year    Marital Status: Divorced    Tobacco Counseling Counseling given: Not Answered    Clinical Intake:  Pre-visit preparation completed: Yes  Pain : No/denies pain     BMI - recorded: 22.9 Nutritional Status: BMI of 19-24  Normal Nutritional Risks: None  No results found for: HGBA1C   How often do you need to have someone help you when you read instructions, pamphlets, or other written materials from your doctor or pharmacy?: 1 - Never  Interpreter Needed?: No  Information entered by :: Brazil Voytko, RMA   Activities of Daily Living     04/28/2024   12:04 PM 05/14/2023    2:07 PM  In your present state of health, do you have any difficulty performing the following  activities:  Hearing? 0 0  Vision?  0  Difficulty concentrating or making decisions? 0 0  Walking or climbing stairs?  0  Dressing or bathing? 0 0  Doing errands, shopping? 0 0  Preparing Food and eating ? N N  Using the Toilet? N N  In the past six months, have you accidently leaked urine? Y N  Do you have problems with loss of bowel control?  N  Managing your Medications? N N  Managing your Finances? N N  Housekeeping or managing your Housekeeping?  N    Patient Care Team: [name], NP as PCP - General (Nurse Practitioner) Raford Riggs, MD as PCP - Cardiology (Cardiology) Onita Duos, MD as Consulting Physician (Neurology) Kandyce Sor, MD as Consulting Physician (Obstetrics and Gynecology) Leslee Reusing, MD as Consulting Physician (Ophthalmology)  I have updated your Care Teams any recent Medical Services you may have received from other providers in the past year.     Assessment:   This is a routine wellness examination for Halimah.  Hearing/Vision screen Hearing Screening - Comments:: Denies hearing difficulties   Vision Screening - Comments:: Wears eyeglasses/ Dr. McCuen   Goals Addressed             This Visit's Progress    Patient Stated: To stay independent.   On track      Depression Screen     04/29/2024    3:14 PM 05/14/2023    2:07 PM 03/06/2023   10:33 AM  PHQ 2/9 Scores  PHQ - 2 Score 3 0 0  PHQ- 9 Score 5 0     Fall Risk     04/28/2024   12:04 PM 05/14/2023    2:05 PM 03/06/2023   10:32 AM 03/18/2018    1:08 PM  Fall Risk   Falls in the past year? 1 0 0 Yes  Number falls in past yr: 1 0 0 2 or more   Injury with Fall? 1 0 0 No   Comment 2 cracked ribs     Risk Factor Category     High Fall Risk   Risk for fall due to : Impaired balance/gait No Fall Risks No Fall Risks   Risk for fall due to: Comment   cane   Follow up Falls evaluation completed;Falls prevention discussed Falls prevention discussed Falls evaluation completed  Education provided      Data saved with a previous flowsheet row definition    MEDICARE RISK AT HOME:  Medicare Risk at Home Any stairs in or around the home?: (Patient-Rptd) Yes If so, are there any without handrails?: (Patient-Rptd) Yes Home free of loose throw rugs in walkways, pet beds, electrical cords, etc?: (Patient-Rptd) No Adequate lighting in your home to reduce risk of falls?: (Patient-Rptd) Yes Life alert?: (Patient-Rptd) Yes Use of a cane, walker or w/c?: (Patient-Rptd) Yes Grab bars in the bathroom?: (Patient-Rptd) Yes Shower chair or bench in shower?: (Patient-Rptd) No Elevated toilet seat or a handicapped toilet?: (Patient-Rptd) No  TIMED UP AND GO:  Was the test performed?  Yes  Length of time to ambulate 10 feet: 30 sec Gait slow and steady with assistive device  Cognitive Function: Declined/Normal: No cognitive concerns noted by patient or family. Patient alert, oriented, able to answer questions appropriately and recall recent events. No signs of memory loss or confusion.        05/14/2023    2:06 PM  6CIT Screen  What Year? 0 points  What month? 0 points  What time? 0 points  Count back from 20 0 points  Months in reverse 0 points  Repeat phrase 0 points  Total Score 0 points    Immunizations Immunization History  Administered Date(s) Administered   Influenza, High Dose Seasonal PF 07/28/2019   Influenza-Unspecified 06/22/2014   PFIZER(Purple Top)SARS-COV-2 Vaccination 10/07/2019, 10/28/2019, 06/26/2020   Pneumococcal Conjugate-13 11/21/2013, 12/14/2014   Pneumococcal Polysaccharide-23 09/26/2001, 03/31/2017   Tdap 11/16/2018   Zoster Recombinant(Shingrix) 06/29/2018, 11/16/2018   Zoster, Live 03/19/2012    Screening Tests Health Maintenance  Topic Date Due   COVID-19 Vaccine (4 - 2024-25 season) 05/24/2023   INFLUENZA VACCINE  04/22/2024   Medicare Annual Wellness (AWV)  04/29/2025   DTaP/Tdap/Td (2 - Td or Tdap) 11/16/2028    Pneumococcal Vaccine: 50+ Years  Completed   DEXA SCAN  Completed   Zoster Vaccines- Shingrix  Completed   Hepatitis B Vaccines  Aged Out   HPV VACCINES  Aged Out   Meningococcal B Vaccine  Aged Out    Health Maintenance  Health Maintenance Due  Topic Date Due   COVID-19 Vaccine (4 - 2024-25 season) 05/24/2023   INFLUENZA VACCINE  04/22/2024   Health Maintenance Items Addressed: See Nurse Notes at the end of this note  Additional Screening:  Vision Screening: Recommended annual ophthalmology exams for early detection of glaucoma and other disorders of the eye. Would you like a referral to an eye doctor? No    Dental Screening: Recommended annual dental exams for proper oral hygiene  Community Resource Referral / Chronic Care Management: CRR required this visit?  No   CCM required this visit?  No   Plan:    I have personally reviewed and noted the following in the patient's chart:   Medical and social history Use of alcohol , tobacco or illicit drugs  Current medications and supplements including opioid prescriptions. Patient  is not currently taking opioid prescriptions. Functional ability and status Nutritional status Physical activity Advanced directives List of other physicians Hospitalizations, surgeries, and ER visits in previous 12 months Vitals Screenings to include cognitive, depression, and falls Referrals and appointments  In addition, I have reviewed and discussed with patient certain preventive protocols, quality metrics, and best practice recommendations. A written personalized care plan for preventive services as well as general preventive health recommendations were provided to patient.   Beola Vasallo L Margean Korell, CMA   04/29/2024   After Visit Summary: (MyChart) Due to this being a telephonic visit, the after visit summary with patients personalized plan was offered to patient via MyChart   Notes: Nothing significant to report at this time.

## 2024-05-11 NOTE — Progress Notes (Deleted)
 [facility] [address] [place] [address] Phone: [phone] Subjective:    I'm seeing this patient by the request  of:  Carol Lauraine BRAVO, NP  CC: Left knee and thoracic spine pain follow-up  [name]  03/31/2024 Continue to monitor.  Did have the broken ribs but is healing appropriately.  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  Seems to be improving and will start to increase activity as tolerated follow-up with me again if needed in 2 to 3 months if doing well otherwise has an appointment in 2 weeks     On patient's heel, discussed with patient about the triamcinolone  and covering it with a Band-Aid for the next 2 weeks.  I do believe the patient does have some neuropathy that is contributing to this.  Patient is to monitor.  Does not wear socks with her sandals.  Follow-up with me as scheduled on the 21st otherwise.     Stable, will continue to monitor but seems to be doing relatively well.   Update 05/12/2024 Carol Woods is a 89 y.o. female coming in with complaint of L knee and T spine pain. Patient states    Patient was seen by neurosurgeon recently for cervical spine pain.  Did get a MRI of the cervical spine.  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    Past Medical History:  Diagnosis Date   Arthritis    back; shoulders (08/31/2014)   Atypical chest pain 11/21/2019   Essential hypertension 07/13/2018   GERD (gastroesophageal reflux disease)    OTC, changed diet   High cholesterol    History of cataract surgery 02/05/2015   2nd eye done 07/23/2015   Hypertension    Hypothyroidism    Past Surgical History:  Procedure Laterality Date   ANTERIOR CERVICAL DECOMP/DISCECTOMY FUSION  02/2010   Dr Colon    BACK SURGERY     COLONOSCOPY W/ POLYPECTOMY     DILATION AND CURETTAGE OF UTERUS     POSTERIOR LUMBAR FUSION  04/2010   Dr Colon   TONSILLECTOMY     age 65   TOTAL SHOULDER ARTHROPLASTY Right 08/31/2014   TOTAL SHOULDER ARTHROPLASTY Right 08/31/2014   Procedure: TOTAL SHOULDER ARTHROPLASTY;  Surgeon: Eva Elsie Herring, MD;  Location: [facility] OR;  Service: Orthopedics;  Laterality: Right;  Right total shoulder arthroplasty   Social History   Socioeconomic History   Marital status: Divorced    Spouse name: Not on file   Number of children: Not on file   Years of education: Not on file   Highest education level: Master's degree (e.g., MA, MS, MEng, MEd, MSW, MBA)  Occupational History   Occupation: RETIRED  Tobacco Use   Smoking status: Former    Current packs/day: 0.00    Average packs/day: 1.5 packs/day for 25.0 years (37.5 ttl pk-yrs)    Types: Cigarettes    Start date: 08/23/1959    Quit date: 08/22/1984    Years since quitting: 39.7   Smokeless tobacco: Never  Vaping Use   Vaping status: Never Used  Substance and Sexual Activity   Alcohol  use: Not Currently    Comment: occasionally/ wine and mixed drinks   Drug use: No   Sexual activity: Not on file  Other Topics  Concern   Not on file  Social History Narrative   Lives alone and has a cat/2025   Social Drivers of Health   Financial Resource Strain: Low Risk  (04/28/2024)   Overall Financial Resource Strain (CARDIA)    Difficulty of Paying Living Expenses: Not hard at all  Food Insecurity: No Food Insecurity (04/28/2024)   Hunger Vital Sign    Worried About Running Out of Food in the Last Year: Never true    Ran Out of Food in the Last Year: Never true  Transportation Needs: No Transportation Needs (04/28/2024)   PRAPARE - Administrator, Civil Service (Medical): No    Lack of Transportation (Non-Medical): No  Physical Activity: Inactive (04/28/2024)   Exercise Vital Sign    Days of Exercise per Week: 0 days     Minutes of Exercise per Session: Not on file  Stress: Stress Concern Present (04/29/2024)   [employer] of Occupational Health - Occupational Stress Questionnaire    Feeling of Stress: Rather much  Social Connections: Unknown (04/28/2024)   Social Connection and Isolation Panel    Frequency of Communication with Friends and Family: Not on file    Frequency of Social Gatherings with Friends and Family: Not on file    Attends Religious Services: More than 4 times per year    Active Member of [employer] or Organizations: Yes    Attends Engineer, structural: More than 4 times per year    Marital Status: Divorced   No Known Allergies Family History  Problem Relation Age of Onset   Breast cancer Mother    Ovarian cancer Mother    Alzheimer's disease Mother    Parkinson's disease Father    Heart disease Father    Hypothyroidism Father    Hypertension Sister    Breast cancer Sister    Kidney disease Sister    Prostate cancer Brother    Heart attack Cousin    Colon cancer Neg Hx    Esophageal cancer Neg Hx    Inflammatory bowel disease Neg Hx    Liver disease Neg Hx    Pancreatic cancer Neg Hx    Rectal cancer Neg Hx    Stomach cancer Neg Hx     Current Outpatient Medications (Endocrine & Metabolic):    alendronate (FOSAMAX) 70 MG tablet, Take 1 tablet po q weekly as directed Oral   levothyroxine  (SYNTHROID ) 112 MCG tablet, Take 1 tablet (112 mcg total) by mouth daily before breakfast.  Current Outpatient Medications (Cardiovascular):    amLODipine  (NORVASC ) 2.5 MG tablet, TAKE THREE TABLETS BY MOUTH IN THE EVENING FOR BLOOD PRESSURE 30 days   lisinopril  (ZESTRIL ) 20 MG tablet, Take 1 tablet (20 mg total) by mouth daily.  Current Outpatient Medications (Respiratory):    benzonatate  (TESSALON ) 100 MG capsule, Take 1 capsule (100 mg total) by mouth 2 (two) times daily as needed for cough.   Current Outpatient Medications (Hematological):    cyanocobalamin  1000 MCG  tablet, Take 1,000 mcg by mouth daily.  Current Outpatient Medications (Other):    b complex vitamins capsule, Take 1 capsule by mouth daily.   CALCIUM PO, Take 1 tablet by mouth daily.   Cholecalciferol (VITAMIN D  PO), Take 1 tablet by mouth daily.   clindamycin (CLEOCIN T) 1 % external solution, Apply 1 application topically daily.    hydrocortisone 2.5 % cream, Apply 1 Application topically daily.   latanoprost (XALATAN) 0.005 % ophthalmic solution, Place 1 drop into both eyes  at bedtime.   Magnesium 500 MG CAPS, Take 500 mg by mouth daily.   nirmatrelvir  & ritonavir  (PAXLOVID , 300/100,) 20 x 150 MG & 10 x 100MG  TBPK, Take 3 tablets by mouth 2 (two) times daily.   Omega-3 Fatty Acids (FISH OIL PO), Take 1 capsule by mouth daily.   triamcinolone  cream (KENALOG ) 0.5 %, Apply 1 Application topically 2 (two) times daily. To affected areas.   Reviewed prior external information including notes and imaging from  primary care provider As well as notes that were available from care everywhere and other healthcare systems.  Past medical history, social, surgical and family history all reviewed in electronic medical record.  No pertanent information unless stated regarding to the chief complaint.   Review of Systems:  No headache, visual changes, nausea, vomiting, diarrhea, constipation, dizziness, abdominal pain, skin rash, fevers, chills, night sweats, weight loss, swollen lymph nodes, body aches, joint swelling, chest pain, shortness of breath, mood changes. POSITIVE muscle aches  Objective  There were no vitals taken for this visit.   General: No apparent distress alert and oriented x3 mood and affect normal, dressed appropriately.  HEENT: Pupils equal, extraocular movements intact  Respiratory: Patient's speak in full sentences and does not appear short of breath  Cardiovascular: No lower extremity edema, non tender, no erythema      Impression and Recommendations:    The above  documentation has been reviewed and is accurate and complete Jini Horiuchi M Collins Dimaria, DO

## 2024-05-12 ENCOUNTER — Ambulatory Visit: Admitting: Family Medicine

## 2024-05-18 ENCOUNTER — Other Ambulatory Visit: Payer: Self-pay | Admitting: Nurse Practitioner

## 2024-05-18 DIAGNOSIS — I10 Essential (primary) hypertension: Secondary | ICD-10-CM

## 2024-05-27 NOTE — Progress Notes (Signed)
 [facility] [address] [place] [address] Phone: [phone] Subjective:   Carol Woods am a scribe for Dr. Claudene.   I'm seeing this patient by the request  of:  Carol Lauraine BRAVO, NP  CC: Knee pain and back pain  [name]  03/31/2024 Continue to monitor. Did have the broken ribs but is healing appropriately. 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 Seems to be improving and will start to increase activity as tolerated follow-up with me again if needed in 2 to 3 months if doing well otherwise has an appointment in 2 weeks   On patient's heel, discussed with patient about the triamcinolone  and covering it with a Band-Aid for the next 2 weeks.  I do believe the patient does have some neuropathy that is contributing to this.  Patient is to monitor.  Does not wear socks with her sandals.  Follow-up with me as scheduled on the 21st otherwise.      Update 05/30/2024 Carol Woods is a 89 y.o. female coming in with complaint of T spine pain. Patient states she has pain in the neck and shoulders some time. Has an appointment with another doctor soon to address spine. Pain moves around. It really depends on she does that day or what she did the day before. Feet get tired. Knees give out but not completely to the point of falling.     Since we have seen patient did get a MRI from another provider.  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  Severe spinal stenosis  Past Medical History:  Diagnosis Date   Arthritis    back; shoulders (08/31/2014)   Atypical chest pain 11/21/2019   Essential hypertension 07/13/2018   GERD (gastroesophageal reflux disease)    OTC, changed diet   High cholesterol    History of cataract surgery 02/05/2015   2nd eye done 07/23/2015   Hypertension    Hypothyroidism    Past Surgical History:   Procedure Laterality Date   ANTERIOR CERVICAL DECOMP/DISCECTOMY FUSION  02/2010   Dr Colon   BACK SURGERY     COLONOSCOPY W/ POLYPECTOMY     DILATION AND CURETTAGE OF UTERUS     POSTERIOR LUMBAR FUSION  04/2010   Dr Colon   TONSILLECTOMY     age 7   TOTAL SHOULDER ARTHROPLASTY Right 08/31/2014   TOTAL SHOULDER ARTHROPLASTY Right 08/31/2014   Procedure: TOTAL SHOULDER ARTHROPLASTY;  Surgeon: Eva Elsie Herring, MD;  Location: [facility] OR;  Service: Orthopedics;  Laterality: Right;  Right total shoulder arthroplasty   Social History   Socioeconomic History   Marital status: Divorced    Spouse name: Not on file   Number of children: Not on file   Years of education: Not on file   Highest education level: Master's degree (e.g., MA, MS, MEng, MEd, MSW, MBA)  Occupational History   Occupation: RETIRED  Tobacco Use   Smoking status: Former    Current packs/day: 0.00    Average packs/day: 1.5 packs/day for 25.0 years (37.5 ttl pk-yrs)    Types: Cigarettes    Start date: 08/23/1959    Quit date: 08/22/1984    Years since quitting: 39.7   Smokeless tobacco: Never  Vaping Use   Vaping status: Never Used  Substance and Sexual Activity   Alcohol  use: Not Currently  Comment: occasionally/ wine and mixed drinks   Drug use: No   Sexual activity: Not on file  Other Topics Concern   Not on file  Social History Narrative   Lives alone and has a cat/2025   Social Drivers of Health   Financial Resource Strain: Low Risk  (04/28/2024)   Overall Financial Resource Strain (CARDIA)    Difficulty of Paying Living Expenses: Not hard at all  Food Insecurity: No Food Insecurity (04/28/2024)   Hunger Vital Sign    Worried About Running Out of Food in the Last Year: Never true    Ran Out of Food in the Last Year: Never true  Transportation Needs: No Transportation Needs (04/28/2024)   PRAPARE - Administrator, Civil Service (Medical): No    Lack of Transportation (Non-Medical): No   Physical Activity: Inactive (04/28/2024)   Exercise Vital Sign    Days of Exercise per Week: 0 days    Minutes of Exercise per Session: Not on file  Stress: Stress Concern Present (04/29/2024)   [employer] of Occupational Health - Occupational Stress Questionnaire    Feeling of Stress: Rather much  Social Connections: Unknown (04/28/2024)   Social Connection and Isolation Panel    Frequency of Communication with Friends and Family: Not on file    Frequency of Social Gatherings with Friends and Family: Not on file    Attends Religious Services: More than 4 times per year    Active Member of [employer] or Organizations: Yes    Attends Engineer, structural: More than 4 times per year    Marital Status: Divorced   No Known Allergies Family History  Problem Relation Age of Onset   Breast cancer Mother    Ovarian cancer Mother    Alzheimer's disease Mother    Parkinson's disease Father    Heart disease Father    Hypothyroidism Father    Hypertension Sister    Breast cancer Sister    Kidney disease Sister    Prostate cancer Brother    Heart attack Cousin    Colon cancer Neg Hx    Esophageal cancer Neg Hx    Inflammatory bowel disease Neg Hx    Liver disease Neg Hx    Pancreatic cancer Neg Hx    Rectal cancer Neg Hx    Stomach cancer Neg Hx     Current Outpatient Medications (Endocrine & Metabolic):    alendronate (FOSAMAX) 70 MG tablet, Take 1 tablet po q weekly as directed Oral   levothyroxine  (SYNTHROID ) 112 MCG tablet, Take 1 tablet (112 mcg total) by mouth daily before breakfast.  Current Outpatient Medications (Cardiovascular):    amLODipine  (NORVASC ) 2.5 MG tablet, TAKE THREE TABLETS BY MOUTH IN THE EVENING FOR BLOOD PRESSURE 30 days   lisinopril  (ZESTRIL ) 20 MG tablet, Take 1 tablet (20 mg total) by mouth daily.  Current Outpatient Medications (Respiratory):    benzonatate  (TESSALON ) 100 MG capsule, Take 1 capsule (100 mg total) by mouth 2 (two) times daily as  needed for cough.   Current Outpatient Medications (Hematological):    cyanocobalamin  1000 MCG tablet, Take 1,000 mcg by mouth daily.  Current Outpatient Medications (Other):    b complex vitamins capsule, Take 1 capsule by mouth daily.   CALCIUM PO, Take 1 tablet by mouth daily.   Cholecalciferol (VITAMIN D  PO), Take 1 tablet by mouth daily.   clindamycin (CLEOCIN T) 1 % external solution, Apply 1 application topically daily.    hydrocortisone 2.5 %  cream, Apply 1 Application topically daily.   latanoprost (XALATAN) 0.005 % ophthalmic solution, Place 1 drop into both eyes at bedtime.   Magnesium 500 MG CAPS, Take 500 mg by mouth daily.   nirmatrelvir  & ritonavir  (PAXLOVID , 300/100,) 20 x 150 MG & 10 x 100MG  TBPK, Take 3 tablets by mouth 2 (two) times daily.   Omega-3 Fatty Acids (FISH OIL PO), Take 1 capsule by mouth daily.   triamcinolone  cream (KENALOG ) 0.5 %, Apply 1 Application topically 2 (two) times daily. To affected areas.   Reviewed prior external information including notes and imaging from  primary care provider As well as notes that were available from care everywhere and other healthcare systems.  Past medical history, social, surgical and family history all reviewed in electronic medical record.  No pertanent information unless stated regarding to the chief complaint.   Review of Systems:  No headache, visual changes, nausea, vomiting, diarrhea, constipation, dizziness, abdominal pain, skin rash, fevers, chills, night sweats, weight loss, swollen lymph nodes, body aches, joint swelling, chest pain, shortness of breath, mood changes. POSITIVE muscle aches  Objective  Blood pressure 132/70, pulse 75, height 5' 1 (1.549 m), weight 121 lb (54.9 kg), SpO2 95%.   General: No apparent distress alert and oriented x3 mood and affect normal, dressed appropriately.  HEENT: Pupils equal, extraocular movements intact  Respiratory: Patient's speak in full sentences and does not  appear short of breath  Ambulates with the aid of a cane.  Patient does have weakness of the upper extremities as well as the lower extremities.  Deep tendon reflexes of the upper extremities 1+ in the brachial radialis and the tricep.  Neck exam has significant loss of lordosis.     Impression and Recommendations:     The above documentation has been reviewed and is accurate and complete Nicklas Mcsweeney M Glendel Jaggers, DO

## 2024-05-30 ENCOUNTER — Ambulatory Visit: Payer: Self-pay | Admitting: Family Medicine

## 2024-05-30 ENCOUNTER — Ambulatory Visit: Admitting: Family Medicine

## 2024-05-30 VITALS — BP 132/70 | HR 75 | Ht 61.0 in | Wt 121.0 lb

## 2024-05-30 DIAGNOSIS — E039 Hypothyroidism, unspecified: Secondary | ICD-10-CM | POA: Diagnosis not present

## 2024-05-30 DIAGNOSIS — M4802 Spinal stenosis, cervical region: Secondary | ICD-10-CM | POA: Diagnosis not present

## 2024-05-30 DIAGNOSIS — R5383 Other fatigue: Secondary | ICD-10-CM | POA: Diagnosis not present

## 2024-05-30 DIAGNOSIS — R6889 Other general symptoms and signs: Secondary | ICD-10-CM

## 2024-05-30 DIAGNOSIS — D472 Monoclonal gammopathy: Secondary | ICD-10-CM

## 2024-05-30 DIAGNOSIS — E785 Hyperlipidemia, unspecified: Secondary | ICD-10-CM | POA: Diagnosis not present

## 2024-05-30 LAB — IBC PANEL
Iron: 76 ug/dL (ref 42–145)
Saturation Ratios: 23.4 % (ref 20.0–50.0)
TIBC: 324.8 ug/dL (ref 250.0–450.0)
Transferrin: 232 mg/dL (ref 212.0–360.0)

## 2024-05-30 LAB — T3, FREE: T3, Free: 3.1 pg/mL (ref 2.3–4.2)

## 2024-05-30 LAB — CBC WITH DIFFERENTIAL/PLATELET
Basophils Absolute: 0 K/uL (ref 0.0–0.1)
Basophils Relative: 0.5 % (ref 0.0–3.0)
Eosinophils Absolute: 0.1 K/uL (ref 0.0–0.7)
Eosinophils Relative: 1.9 % (ref 0.0–5.0)
HCT: 37.4 % (ref 36.0–46.0)
Hemoglobin: 12.8 g/dL (ref 12.0–15.0)
Lymphocytes Relative: 26.3 % (ref 12.0–46.0)
Lymphs Abs: 1.9 K/uL (ref 0.7–4.0)
MCHC: 34.2 g/dL (ref 30.0–36.0)
MCV: 87.9 fl (ref 78.0–100.0)
Monocytes Absolute: 0.6 K/uL (ref 0.1–1.0)
Monocytes Relative: 8.6 % (ref 3.0–12.0)
Neutro Abs: 4.6 K/uL (ref 1.4–7.7)
Neutrophils Relative %: 62.7 % (ref 43.0–77.0)
Platelets: 221 K/uL (ref 150.0–400.0)
RBC: 4.25 Mil/uL (ref 3.87–5.11)
RDW: 13.3 % (ref 11.5–15.5)
WBC: 7.3 K/uL (ref 4.0–10.5)

## 2024-05-30 LAB — COMPREHENSIVE METABOLIC PANEL WITH GFR
ALT: 9 U/L (ref 0–35)
AST: 13 U/L (ref 0–37)
Albumin: 4.2 g/dL (ref 3.5–5.2)
Alkaline Phosphatase: 50 U/L (ref 39–117)
BUN: 20 mg/dL (ref 6–23)
CO2: 31 meq/L (ref 19–32)
Calcium: 10 mg/dL (ref 8.4–10.5)
Chloride: 101 meq/L (ref 96–112)
Creatinine, Ser: 0.59 mg/dL (ref 0.40–1.20)
GFR: 80.63 mL/min (ref >60.00)
Glucose, Bld: 101 mg/dL — ABNORMAL HIGH (ref 70–99)
Potassium: 3 meq/L — ABNORMAL LOW (ref 3.5–5.1)
Sodium: 140 meq/L (ref 135–145)
Total Bilirubin: 0.5 mg/dL (ref 0.2–1.2)
Total Protein: 7.4 g/dL (ref 6.0–8.3)

## 2024-05-30 LAB — TSH: TSH: 4.52 u[IU]/mL (ref 0.35–5.50)

## 2024-05-30 LAB — T4, FREE: Free T4: 0.88 ng/dL (ref 0.60–1.60)

## 2024-05-30 LAB — VITAMIN D 25 HYDROXY (VIT D DEFICIENCY, FRACTURES): VITD: 39.18 ng/mL (ref 30.00–100.00)

## 2024-05-30 LAB — VITAMIN B12: Vitamin B-12: 1029 pg/mL — ABNORMAL HIGH (ref 211–911)

## 2024-05-30 LAB — FERRITIN: Ferritin: 29.2 ng/mL (ref 10.0–291.0)

## 2024-05-30 NOTE — Patient Instructions (Signed)
 Let us  know when surgery date is Labs today

## 2024-05-30 NOTE — Assessment & Plan Note (Addendum)
 Beyond severe at this time.  Do feel that surgical intervention is the only way that will help her.  Discussed which activities to do and which ones to avoid.  Increase activity slowly.  Discussed icing regimen and home exercises, we will check some labs to see if anything we can do to help her optimize healing after surgery.  Follow-up with me as needed

## 2024-05-30 NOTE — Addendum Note (Signed)
 Addended by: GALA BERWYN CROME on: 05/30/2024 11:58 AM   Modules accepted: Orders

## 2024-05-30 NOTE — Assessment & Plan Note (Signed)
 Will recheck  F/u with specialist

## 2024-05-30 NOTE — Assessment & Plan Note (Signed)
 Recheck thyroid  labs and calcium labs to see if anything is contributing.

## 2024-06-02 LAB — LIPID PANEL
Cholesterol: 200 mg/dL — ABNORMAL HIGH (ref <200)
HDL: 34 mg/dL — ABNORMAL LOW (ref >=50)
Non-HDL Cholesterol (Calc): 166 mg/dL — ABNORMAL HIGH (ref <130)
Total CHOL/HDL Ratio: 5.9 (calc) — ABNORMAL HIGH (ref <5.0)
Triglycerides: 401 mg/dL — ABNORMAL HIGH (ref <150)

## 2024-06-02 LAB — PTH, INTACT AND CALCIUM
Calcium: 10.2 mg/dL (ref 8.6–10.4)
PTH: 17 pg/mL (ref 16–77)

## 2024-06-07 LAB — MULTIPLE MYELOMA PANEL, SERUM
Albumin SerPl Elph-Mcnc: 3.3 g/dL (ref 2.9–4.4)
Albumin/Glob SerPl: 0.9 (ref 0.7–1.7)
Alpha 1: 0.3 g/dL (ref 0.0–0.4)
Alpha2 Glob SerPl Elph-Mcnc: 0.9 g/dL (ref 0.4–1.0)
B-Globulin SerPl Elph-Mcnc: 1.2 g/dL (ref 0.7–1.3)
Gamma Glob SerPl Elph-Mcnc: 1.7 g/dL (ref 0.4–1.8)
Globulin, Total: 4 g/dL — ABNORMAL HIGH (ref 2.2–3.9)
IgA/Immunoglobulin A, Serum: 109 mg/dL (ref 64–422)
IgG (Immunoglobin G), Serum: 1992 mg/dL — ABNORMAL HIGH (ref 586–1602)
IgM (Immunoglobulin M), Srm: 77 mg/dL (ref 26–217)
M Protein SerPl Elph-Mcnc: 1.3 g/dL — ABNORMAL HIGH
Total Protein: 7.3 g/dL (ref 6.0–8.5)

## 2024-06-10 DIAGNOSIS — M47816 Spondylosis without myelopathy or radiculopathy, lumbar region: Secondary | ICD-10-CM | POA: Diagnosis not present

## 2024-06-10 DIAGNOSIS — M4712 Other spondylosis with myelopathy, cervical region: Secondary | ICD-10-CM | POA: Diagnosis not present

## 2024-06-13 ENCOUNTER — Ambulatory Visit: Admitting: Family Medicine

## 2024-06-13 VITALS — BP 128/64 | HR 76 | Ht 61.0 in | Wt 122.0 lb

## 2024-06-13 DIAGNOSIS — M21372 Foot drop, left foot: Secondary | ICD-10-CM | POA: Diagnosis not present

## 2024-06-13 DIAGNOSIS — R2689 Other abnormalities of gait and mobility: Secondary | ICD-10-CM

## 2024-06-13 DIAGNOSIS — R269 Unspecified abnormalities of gait and mobility: Secondary | ICD-10-CM | POA: Diagnosis not present

## 2024-06-13 DIAGNOSIS — M4802 Spinal stenosis, cervical region: Secondary | ICD-10-CM | POA: Diagnosis not present

## 2024-06-13 DIAGNOSIS — D472 Monoclonal gammopathy: Secondary | ICD-10-CM

## 2024-06-13 NOTE — Patient Instructions (Addendum)
 PT [address] Heel lift added Referral Dr. Clois in [place] See you again in 6 weeks

## 2024-06-13 NOTE — Progress Notes (Signed)
 [facility] [address] [place] [address] Phone: [phone] Subjective:   [name], am serving as a scribe for Dr. Arthea Claudene.  I'm seeing this patient by the request  of:  [name], NP  CC: Exacerbation of worsening balance.  [name]  05/30/2024 Recheck thyroid  labs and calcium labs to see if anything is contributing.     Beyond severe at this time.  Do feel that surgical intervention is the only way that will help her.  Discussed which activities to do and which ones to avoid.  Increase activity slowly.  Discussed icing regimen and home exercises, we will check some labs to see if anything we can do to help her optimize healing after surgery.  Follow-up with me as needed     Updated 06/13/2024 Carol Woods is a 89 y.o. female coming in with complaint of has fallen recently about 7-10 days ago. Hurt her R foot but was checked by nurse. L lower leg pain now. Pain also in the R hip and had xray this past Friday, Dr. Colon. Dr. Colon not doing surgery this year. Balance is off and would like PT.   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    Past Medical History:  Diagnosis Date   Arthritis    back; shoulders (08/31/2014)   Atypical chest pain 11/21/2019   Essential hypertension 07/13/2018   GERD (gastroesophageal reflux disease)    OTC, changed diet   High cholesterol    History of cataract surgery 02/05/2015   2nd eye done 07/23/2015   Hypertension    Hypothyroidism    Past Surgical History:  Procedure Laterality Date   ANTERIOR CERVICAL DECOMP/DISCECTOMY FUSION  02/2010   Dr Colon   BACK SURGERY     COLONOSCOPY W/ POLYPECTOMY     DILATION AND CURETTAGE OF UTERUS     POSTERIOR LUMBAR FUSION  04/2010   Dr Colon   TONSILLECTOMY     age 59   TOTAL SHOULDER ARTHROPLASTY Right 08/31/2014   TOTAL  SHOULDER ARTHROPLASTY Right 08/31/2014   Procedure: TOTAL SHOULDER ARTHROPLASTY;  Surgeon: Eva Elsie Herring, MD;  Location: [facility] OR;  Service: Orthopedics;  Laterality: Right;  Right total shoulder arthroplasty   Social History   Socioeconomic History   Marital status: Divorced    Spouse name: Not on file   Number of children: Not on file   Years of education: Not on file   Highest education level: Master's degree (e.g., MA, MS, MEng, MEd, MSW, MBA)  Occupational History   Occupation: RETIRED  Tobacco Use   Smoking status: Former    Current packs/day: 0.00    Average packs/day: 1.5 packs/day for 25.0 years (37.5 ttl pk-yrs)    Types: Cigarettes    Start date: 08/23/1959    Quit date: 08/22/1984    Years since quitting: 39.8   Smokeless tobacco: Never  Vaping Use   Vaping status: Never Used  Substance and Sexual Activity   Alcohol  use: Not Currently    Comment: occasionally/ wine and mixed drinks   Drug use: No   Sexual activity: Not on file  Other Topics Concern   Not on file  Social History Narrative   Lives alone and has a cat/2025   Social Drivers of Health   Financial Resource Strain: Low Risk  (04/28/2024)   Overall Financial Resource  Strain (CARDIA)    Difficulty of Paying Living Expenses: Not hard at all  Food Insecurity: No Food Insecurity (04/28/2024)   Hunger Vital Sign    Worried About Running Out of Food in the Last Year: Never true    Ran Out of Food in the Last Year: Never true  Transportation Needs: No Transportation Needs (04/28/2024)   PRAPARE - Administrator, Civil Service (Medical): No    Lack of Transportation (Non-Medical): No  Physical Activity: Inactive (04/28/2024)   Exercise Vital Sign    Days of Exercise per Week: 0 days    Minutes of Exercise per Session: Not on file  Stress: Stress Concern Present (04/29/2024)   [employer] of Occupational Health - Occupational Stress Questionnaire    Feeling of Stress: Rather much  Social  Connections: Unknown (04/28/2024)   Social Connection and Isolation Panel    Frequency of Communication with Friends and Family: Not on file    Frequency of Social Gatherings with Friends and Family: Not on file    Attends Religious Services: More than 4 times per year    Active Member of [employer] or Organizations: Yes    Attends Engineer, structural: More than 4 times per year    Marital Status: Divorced   No Known Allergies Family History  Problem Relation Age of Onset   Breast cancer Mother    Ovarian cancer Mother    Alzheimer's disease Mother    Parkinson's disease Father    Heart disease Father    Hypothyroidism Father    Hypertension Sister    Breast cancer Sister    Kidney disease Sister    Prostate cancer Brother    Heart attack Cousin    Colon cancer Neg Hx    Esophageal cancer Neg Hx    Inflammatory bowel disease Neg Hx    Liver disease Neg Hx    Pancreatic cancer Neg Hx    Rectal cancer Neg Hx    Stomach cancer Neg Hx     Current Outpatient Medications (Endocrine & Metabolic):    alendronate (FOSAMAX) 70 MG tablet, Take 1 tablet po q weekly as directed Oral   levothyroxine  (SYNTHROID ) 112 MCG tablet, Take 1 tablet (112 mcg total) by mouth daily before breakfast.  Current Outpatient Medications (Cardiovascular):    amLODipine  (NORVASC ) 2.5 MG tablet, TAKE THREE TABLETS BY MOUTH IN THE EVENING FOR BLOOD PRESSURE 30 days   lisinopril  (ZESTRIL ) 20 MG tablet, Take 1 tablet (20 mg total) by mouth daily.  Current Outpatient Medications (Respiratory):    benzonatate  (TESSALON ) 100 MG capsule, Take 1 capsule (100 mg total) by mouth 2 (two) times daily as needed for cough.   Current Outpatient Medications (Hematological):    cyanocobalamin  1000 MCG tablet, Take 1,000 mcg by mouth daily.  Current Outpatient Medications (Other):    b complex vitamins capsule, Take 1 capsule by mouth daily.   CALCIUM PO, Take 1 tablet by mouth daily.   Cholecalciferol (VITAMIN D   PO), Take 1 tablet by mouth daily.   clindamycin (CLEOCIN T) 1 % external solution, Apply 1 application topically daily.    hydrocortisone 2.5 % cream, Apply 1 Application topically daily.   latanoprost (XALATAN) 0.005 % ophthalmic solution, Place 1 drop into both eyes at bedtime.   Magnesium 500 MG CAPS, Take 500 mg by mouth daily.   nirmatrelvir  & ritonavir  (PAXLOVID , 300/100,) 20 x 150 MG & 10 x 100MG  TBPK, Take 3 tablets by mouth 2 (two) times  daily.   Omega-3 Fatty Acids (FISH OIL PO), Take 1 capsule by mouth daily.   triamcinolone  cream (KENALOG ) 0.5 %, Apply 1 Application topically 2 (two) times daily. To affected areas.   Reviewed prior external information including notes and imaging from  primary care provider As well as notes that were available from care everywhere and other healthcare systems.  Past medical history, social, surgical and family history all reviewed in electronic medical record.  No pertanent information unless stated regarding to the chief complaint.   Review of Systems:  No headache, visual changes, nausea, vomiting, diarrhea, constipation, dizziness, abdominal pain, skin rash, fevers, chills, night sweats, weight loss, swollen lymph nodes, body aches, joint swelling, chest pain, shortness of breath, mood changes. POSITIVE muscle aches  Objective  Blood pressure 128/64, pulse 76, height 5' 1 (1.549 m), weight 122 lb (55.3 kg), SpO2 96%.   General: No apparent distress alert and oriented x3 mood and affect normal, dressed appropriately.  HEENT: Pupils equal, extraocular movements intact  Respiratory: Patient's speak in full sentences and does not appear short of breath  Weakness of the hands noted bilaterally.  Some thenar eminence wasting.  Patient does have is significantly limited range of motion of the neck.  Patient uses a cane for walking.  Mild atrophy noted of the lower extremities. Patient does have some swelling over the posterior tibialis tendon.   This is on the left side.  Mild foot drop noted.    Impression and Recommendations:      The above documentation has been reviewed and is accurate and complete Hoang Reich M Dariana Garbett, DO

## 2024-06-13 NOTE — Assessment & Plan Note (Signed)
 Multifactorial.  Does have significant degenerative disc disease of the cervical spine that makes him nervous.  Also has MGUS.  We discussed with patient about what to do next.  Patient is concerned this is worsening at this time and did have a recent fall.  Will start with formal physical therapy to help but also get another referral to neurosurgeon to discuss for patient's recommendations.

## 2024-06-13 NOTE — Assessment & Plan Note (Signed)
 With patient also having more posterior tibialis tendinitis.  This is difficult with patient having some mild weakness noted.  Discussed a very small 1/16 inch of a heel lift.  Do not want to make it significant with patient having weakness already of picking at the toe.  Discussed icing regimen and home exercises otherwise.  Follow-up again in 6 to 8 weeks

## 2024-06-13 NOTE — Assessment & Plan Note (Signed)
 Severe overall.  Most recent MRI showed significant progression.  Patient feels like today she may not be in good enough health to do surgery at a later date and social determinants of health includes patient's decreasing physical activity and is making patient significantly more stress.  Will refer patient for a second opinion with neurosurgery to discuss if surgical intervention or conservative therapy is better.

## 2024-06-29 DIAGNOSIS — F330 Major depressive disorder, recurrent, mild: Secondary | ICD-10-CM | POA: Diagnosis not present

## 2024-07-04 DIAGNOSIS — M810 Age-related osteoporosis without current pathological fracture: Secondary | ICD-10-CM | POA: Diagnosis not present

## 2024-07-14 ENCOUNTER — Ambulatory Visit: Attending: Family Medicine | Admitting: Physical Therapy

## 2024-07-14 DIAGNOSIS — R269 Unspecified abnormalities of gait and mobility: Secondary | ICD-10-CM | POA: Diagnosis not present

## 2024-07-14 DIAGNOSIS — R2681 Unsteadiness on feet: Secondary | ICD-10-CM | POA: Insufficient documentation

## 2024-07-14 DIAGNOSIS — R2689 Other abnormalities of gait and mobility: Secondary | ICD-10-CM | POA: Diagnosis not present

## 2024-07-14 DIAGNOSIS — M6281 Muscle weakness (generalized): Secondary | ICD-10-CM | POA: Insufficient documentation

## 2024-07-14 NOTE — Therapy (Signed)
 OUTPATIENT PHYSICAL THERAPY NEURO EVALUATION   Patient Name: Carol Woods MRN: [id number] DOB:1934/01/16, 89 y.o., female Today's Date: 07/14/2024   PCP: Elnor Lauraine FORBES, NP REFERRING PROVIDER: Claudene Arthea HERO, DO  END OF SESSION:  PT End of Session - 07/14/24 1152     Visit Number 1    Number of Visits 17   with eval   Date for Recertification  09/22/24    Authorization Type [employer]    PT Start Time 1145    PT Stop Time 1230    PT Time Calculation (min) 45 min    Equipment Utilized During Treatment Gait belt    Activity Tolerance Patient tolerated treatment well    Behavior During Therapy [facility] for tasks assessed/performed          Past Medical History:  Diagnosis Date   Arthritis    back; shoulders (08/31/2014)   Atypical chest pain 11/21/2019   Essential hypertension 07/13/2018   GERD (gastroesophageal reflux disease)    OTC, changed diet   High cholesterol    History of cataract surgery 02/05/2015   2nd eye done 07/23/2015   Hypertension    Hypothyroidism    Past Surgical History:  Procedure Laterality Date   ANTERIOR CERVICAL DECOMP/DISCECTOMY FUSION  02/2010   Dr Colon   BACK SURGERY     COLONOSCOPY W/ POLYPECTOMY     DILATION AND CURETTAGE OF UTERUS     POSTERIOR LUMBAR FUSION  04/2010   Dr Colon   TONSILLECTOMY     age 71   TOTAL SHOULDER ARTHROPLASTY Right 08/31/2014   TOTAL SHOULDER ARTHROPLASTY Right 08/31/2014   Procedure: TOTAL SHOULDER ARTHROPLASTY;  Surgeon: Eva Elsie Herring, MD;  Location: [facility] OR;  Service: Orthopedics;  Laterality: Right;  Right total shoulder arthroplasty   Patient Active Problem List   Diagnosis Date Noted   Degenerative arthritis of left knee 03/17/2024   Dry skin 03/17/2024   COVID-19 04/23/2023   Spinal stenosis in cervical region 04/15/2023   Diarrhea 03/06/2023   Falls infrequently 03/06/2023   Fatigue 03/06/2023   Forgetfulness 03/06/2023   Light headedness 03/06/2023   Mass of hard palate  03/06/2023   Hypokalemia 03/06/2023   Hypothyroidism 03/06/2023   Hyperlipidemia 03/06/2023   Hypocalcemia 03/06/2023   Contusion of chest 02/05/2023   Left foot drop 10/31/2022   Pill dysphagia 07/11/2022   Change in bowel habits 07/11/2022   History of colonic polyps 07/11/2022   GERD (gastroesophageal reflux disease) 11/21/2019   Atypical chest pain 11/21/2019   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   Essential hypertension 07/13/2018   Lumbar spondylosis 06/10/2018   Thoracic spinal stenosis 03/18/2018   Spinal stenosis of lumbar region 03/18/2018   Gait abnormality 02/04/2018   Paresthesia 02/04/2018   MGUS (monoclonal gammopathy of unknown significance) 12/02/2017   Status post total shoulder arthroplasty 08/31/2014    ONSET DATE: 06/13/2024 (referral date)  REFERRING DIAG: R26.9 (ICD-10-CM) - Gait abnormality R26.89 (ICD-10-CM) - Balance problem  THERAPY DIAG:  Unsteadiness on feet  Other abnormalities of gait and mobility  Muscle weakness (generalized)  Rationale for Evaluation and Treatment: Rehabilitation  SUBJECTIVE:  SUBJECTIVE STATEMENT:  Pt received seated alone in lobby with her stand alone cane.  Pt familiar to this clinic, last seen Dec 2024 for similar balance issues. Pt had to d/c early from PT at that time as her daughter was sick, her daughter ended up passing away in January 2025. Pt reports that her memory has been off since her daughter's passing and she has not been as mobile. She also reports that she had a rough start to her day. She was determined to make it to PT on time/early and was rushing around. She was carrying something in both hands coming down her stairs and ended up dropping a cup of water all over her pants and her  shoes.  Pt reports that she continues to have balance issues but she does feel like they have gotten worse over the past year. She recounts several falls since last being here: her cat tripped her, she fell on her deck and broke 2 ribs, she fell in [place] stepping up onto a bocce court and strained a tendon in her R ankle, etc. She felt like before she was partially functional but she does not feel very functional right now.  She sees Dr. Claudene again first week of November   Pt does have B AFOs but can't get them to fit in her shoes so has left them in a closet somewhere. She has noticed a lot more weakness in her LLE and tends to rely on her RLE, feels like her L glutes have wasted away.     Pt accompanied by: self  PERTINENT HISTORY: PMH: HTN, Hypothyroidism, arthritis,spinal stenosis, L foot drop, neuropathy  PAIN:  Are you having pain? Yes: NPRS scale: not rated Pain location: chronic pain - pain in shoulders, arms, lower back all sometimes Pain description: not described Aggravating factors: not described Relieving factors: not described  PRECAUTIONS: Fall  RED FLAGS: None   WEIGHT BEARING RESTRICTIONS: No  FALLS: Has patient fallen in last 6 months? Yes. Number of falls see subjective from eval  LIVING ENVIRONMENT: Lives with: lives alone Lives in: House/apartment Stairs: Yes: External: 4 steps; on right going up Has following equipment at home: stand alone cane  PLOF: Independent with gait, Independent with transfers, and Requires assistive device for independence  PATIENT GOALS: be able to walk better, toss my cane, but not sure that will ever happen stay out of a wheelchair or walker - does use walker occasionally - in the yard I would like to not have surgery - Dr. Claudene thinks she needs to have it  OBJECTIVE:  Note: Objective measures were completed at Evaluation unless otherwise noted.  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  COGNITION: Overall cognitive status: reports memory impairments for about a year   SENSATION: Neuropathy in BLE (L>R)  COORDINATION: Impaired in BLE  EDEMA:  Feet and ankles get swollen sometimes   POSTURE: rounded shoulders, forward head, increased thoracic kyphosis, and posterior pelvic tilt  LOWER EXTREMITY ROM:     Active  Right Eval Left Eval  Hip flexion    Hip extension    Hip abduction    Hip adduction    Hip internal rotation    Hip external rotation    Knee flexion    Knee extension    Ankle dorsiflexion Tight gastroc Tight gastroc  Ankle plantarflexion  Ankle inversion    Ankle eversion     (Blank rows = not tested)  LOWER EXTREMITY MMT:    MMT Right Eval Left Eval  Hip flexion 5 5  Hip extension    Hip abduction    Hip adduction    Hip internal rotation    Hip external rotation    Knee flexion 3 3  Knee extension 4 4  Ankle dorsiflexion 3- 2-  Ankle plantarflexion    Ankle inversion    Ankle eversion    (Blank rows = not tested)  BED MOBILITY:  Mod I via logroll per patient report  TRANSFERS: Sit to stand: SBA  Assistive device utilized: None     Stand to sit: SBA  Assistive device utilized: None     Chair to chair: SBA  Assistive device utilized: None       RAMP:  Not tested  CURB:  Not tested  STAIRS: Not tested GAIT: Findings:  Gait pattern: decreased hip/knee flexion- Left, decreased ankle dorsiflexion- Right, decreased ankle dorsiflexion- Left, poor foot clearance- Right, and poor foot clearance- Left Distance walked: various clinic distances Assistive device utilized: stand alone cane Level of assistance: Min A Comments: to be further assessed   FUNCTIONAL TESTS:    [facility] PT Assessment - 07/14/24 1218       Ambulation/Gait   Gait velocity 32.8 ft over 19.81 sec = 1.66 ft/sec   with stand alone cane  and min A     Standardized Balance Assessment   Standardized Balance Assessment Timed Up and Go Test;Five Times Sit to Stand    Five times sit to stand comments  29.4 sec   no UE but severe posterior LOB - needs min A to prevent a fall     Timed Up and Go Test   TUG Normal TUG    Normal TUG (seconds) 19.35   with stand alone cane and min A                                                                                                                                     TREATMENT DATE:  PT Evaluation   PATIENT EDUCATION: Education details: Eval findings, PT POC, recommended she use a RW instead of her stand alone cane for safety - she is not happy about this Person educated: Patient Education method: Medical illustrator Education comprehension: verbalized understanding, returned demonstration, and needs further education  HOME EXERCISE PROGRAM: To be initiated and/or reviewed from previous POC  GOALS: Goals reviewed with patient? Yes  SHORT TERM GOALS: Target date: 08/11/2024  Pt will be independent with initial HEP for improved strength, balance, transfers and gait. Baseline: Goal status: INITIAL  2.  Pt will improve gait velocity to at least 1.75 ft/sec for improved gait efficiency and performance at CGA level  Baseline: 1.66 ft/sec with stand alone cane min A (10/23) Goal status: INITIAL  3.  Pt will improve 5 x STS to less than or equal to 25 seconds to demonstrate improved functional strength and transfer efficiency.  Baseline: 29.4 sec with no UE but posterior LOB and min A (10/23) Goal status: INITIAL  4.  Pt will improve normal TUG to less than or equal to 16 seconds for improved functional mobility and decreased fall risk. Baseline: 19.35 sec with stand alone cane and min A (10/23) Goal status: INITIAL  5.  Berg to be assessed and STG set Baseline:  Goal status: INITIAL    LONG TERM GOALS: Target date: 09/08/2024   Pt will be independent with  final HEP for improved strength, balance, transfers and gait. Baseline:  Goal status: INITIAL  2.  Pt will improve gait velocity to at least 2.0 ft/sec for improved gait efficiency and performance at SBA level  Baseline: 1.66 ft/sec with stand alone cane min A (10/23) Goal status: INITIAL  3.  Pt will improve 5 x STS to less than or equal to 21 seconds to demonstrate improved functional strength and transfer efficiency.  Baseline:  29.4 sec with no UE but posterior LOB and min A (10/23) Goal status: INITIAL  4.  Pt will improve normal TUG to less than or equal to 13 seconds for improved functional mobility and decreased fall risk. Baseline: 19.35 sec with stand alone cane and min A (10/23) Goal status: INITIAL  5.  Berg to be assessed and LTG set Baseline:  Goal status: INITIAL    ASSESSMENT:  CLINICAL IMPRESSION: Patient is a 89 year old female referred to Neuro OPPT for ongoing gait and balance impairments.   Pt's PMH is significant for: HTN, Hypothyroidism, arthritis,spinal stenosis, L foot drop, neuropathy. The following deficits were present during the exam: decreased BLE strength, LE sensory impairments, gait impairments, L foot drop, and impaired balance. Based on her significant fall history, 5xSTS score of 29.4 sec, gait speed of 1.66 ft/sec, TUG score of 19.35 sec, and her observed gait this visit, pt is an increased risk for falls. Pt would benefit from skilled PT to address these impairments and functional limitations to maximize functional mobility independence.   OBJECTIVE IMPAIRMENTS: Abnormal gait, decreased balance, decreased cognition, decreased coordination, difficulty walking, decreased ROM, decreased strength, impaired perceived functional ability, impaired sensation, improper body mechanics, postural dysfunction, and pain.   ACTIVITY LIMITATIONS: carrying, lifting, bending, standing, stairs, transfers, bed mobility, and locomotion level  PARTICIPATION  LIMITATIONS: meal prep, cleaning, laundry, driving, community activity, and yard work  PERSONAL FACTORS: Age, Time since onset of injury/illness/exacerbation, and 3+ comorbidities:   PMH: HTN, Hypothyroidism, arthritis,spinal stenosis, L foot drop, neuropathyare also affecting patient's functional outcome.   REHAB POTENTIAL: Fair has tried PT in the past, buy-in regarding AD recommendations, age, cervical spinal stenosis, neuropathy, foot drop  CLINICAL DECISION MAKING: Unstable/unpredictable  EVALUATION COMPLEXITY: High  PLAN:  PT FREQUENCY: 2x/week  PT DURATION: 8 weeks  PLANNED INTERVENTIONS: [id number]- PT Re-evaluation, 97750- Physical Performance Testing, 97110-Therapeutic exercises, 97530- Therapeutic activity, [id number]- Neuromuscular re-education, 97535- Self Care, [id number]- Manual therapy, [id number]- Gait training, [phone]- Orthotic Initial, [phone]- Orthotic/Prosthetic subsequent, [phone]- Aquatic Therapy, [phone]- Electrical stimulation (manual), [phone] (1-2 muscles), [id number] (3+ muscles)- Dry Needling, Patient/Family education, Balance training, Stair training, Taping, Joint mobilization, Spinal mobilization, Cognitive remediation, DME instructions, Cryotherapy, and Moist heat  PLAN FOR NEXT SESSION: assess Berg and set STG/LTG, assess B hip abd strength and address wasting of L glutes, work on LE placement and anterior weight shift with sit to stands  Waddell Southgate, PT Waddell Southgate, PT, DPT, CSRS  07/14/2024, 12:44 PM

## 2024-07-15 ENCOUNTER — Ambulatory Visit: Payer: Self-pay | Admitting: Nurse Practitioner

## 2024-07-15 ENCOUNTER — Ambulatory Visit: Admitting: Nurse Practitioner

## 2024-07-15 VITALS — BP 176/70 | HR 65 | Temp 98.0°F | Ht 61.0 in | Wt 120.0 lb

## 2024-07-15 DIAGNOSIS — E039 Hypothyroidism, unspecified: Secondary | ICD-10-CM

## 2024-07-15 DIAGNOSIS — E876 Hypokalemia: Secondary | ICD-10-CM

## 2024-07-15 DIAGNOSIS — R296 Repeated falls: Secondary | ICD-10-CM | POA: Diagnosis not present

## 2024-07-15 DIAGNOSIS — M810 Age-related osteoporosis without current pathological fracture: Secondary | ICD-10-CM

## 2024-07-15 DIAGNOSIS — D472 Monoclonal gammopathy: Secondary | ICD-10-CM | POA: Diagnosis not present

## 2024-07-15 DIAGNOSIS — I10 Essential (primary) hypertension: Secondary | ICD-10-CM | POA: Diagnosis not present

## 2024-07-15 LAB — COMPREHENSIVE METABOLIC PANEL WITH GFR
ALT: 11 U/L (ref 0–35)
AST: 15 U/L (ref 0–37)
Albumin: 4.4 g/dL (ref 3.5–5.2)
Alkaline Phosphatase: 57 U/L (ref 39–117)
BUN: 14 mg/dL (ref 6–23)
CO2: 33 meq/L — ABNORMAL HIGH (ref 19–32)
Calcium: 9.6 mg/dL (ref 8.4–10.5)
Chloride: 100 meq/L (ref 96–112)
Creatinine, Ser: 0.51 mg/dL (ref 0.40–1.20)
GFR: 83.44 mL/min (ref >60.00)
Glucose, Bld: 97 mg/dL (ref 70–99)
Potassium: 3.1 meq/L — ABNORMAL LOW (ref 3.5–5.1)
Sodium: 142 meq/L (ref 135–145)
Total Bilirubin: 0.7 mg/dL (ref 0.2–1.2)
Total Protein: 7.9 g/dL (ref 6.0–8.3)

## 2024-07-15 LAB — CBC
HCT: 37.8 % (ref 36.0–46.0)
Hemoglobin: 12.8 g/dL (ref 12.0–15.0)
MCHC: 33.7 g/dL (ref 30.0–36.0)
MCV: 86.4 fl (ref 78.0–100.0)
Platelets: 213 K/uL (ref 150.0–400.0)
RBC: 4.38 Mil/uL (ref 3.87–5.11)
RDW: 13.8 % (ref 11.5–15.5)
WBC: 9 K/uL (ref 4.0–10.5)

## 2024-07-15 LAB — LIPID PANEL
Cholesterol: 184 mg/dL (ref 0–200)
HDL: 36.1 mg/dL — ABNORMAL LOW (ref >39.00)
LDL Cholesterol: 109 mg/dL — ABNORMAL HIGH (ref 0–99)
NonHDL: 147.99
Total CHOL/HDL Ratio: 5
Triglycerides: 193 mg/dL — ABNORMAL HIGH (ref 0.0–149.0)
VLDL: 38.6 mg/dL (ref 0.0–40.0)

## 2024-07-15 LAB — TSH: TSH: 7.62 u[IU]/mL — ABNORMAL HIGH (ref 0.35–5.50)

## 2024-07-15 LAB — HEMOGLOBIN A1C: Hgb A1c MFr Bld: 6 % (ref 4.6–6.5)

## 2024-07-15 MED ORDER — LEVOTHYROXINE SODIUM 125 MCG PO TABS
125.0000 ug | ORAL_TABLET | Freq: Every day | ORAL | 3 refills | Status: AC
Start: 2024-07-15 — End: ?

## 2024-07-15 MED ORDER — AMLODIPINE BESYLATE 2.5 MG PO TABS
7.5000 mg | ORAL_TABLET | Freq: Every day | ORAL | 6 refills | Status: DC
Start: 2024-07-15 — End: 2024-07-15

## 2024-07-15 MED ORDER — AMLODIPINE BESYLATE 2.5 MG PO TABS: 5.0000 mg | ORAL_TABLET | Freq: Every day | ORAL | 6 refills | Status: DC

## 2024-07-15 MED ORDER — POTASSIUM CHLORIDE CRYS ER 20 MEQ PO TBCR: 20.0000 meq | EXTENDED_RELEASE_TABLET | Freq: Every day | ORAL | 3 refills | Status: AC

## 2024-07-15 NOTE — Assessment & Plan Note (Signed)
  Falls Reports falls at home, no recent fractures. Starting physical therapy for balance and strength. Uses life alert system. - Continue physical therapy. - Ensure use of life alert system.

## 2024-07-15 NOTE — Assessment & Plan Note (Addendum)
 Hypertension Blood pressure elevated, likely due to white coat syndrome and stress. Current regimen includes amlodipine  and lisinopril .  - Repeat in-office BP identified systolic pressure >160. Per shared decision making continue amlodipine5 mg at night and continue lisinopril  20 mg in the morning. - Provide prescription for new blood pressure cuff. - Instruct to monitor blood pressure at home for a week. - Bring BP logs to office visit in 1 week - Continue using life alert, reach out if dizziness, lightheadedness occurs

## 2024-07-15 NOTE — Progress Notes (Signed)
 Complete physical exam  Patient: Carol Woods   DOB: [birth year]   89 y.o. Female  MRN: [id number]  Subjective:    No chief complaint on file.   Carol Woods is a 89 y.o. female who presents today for a complete physical exam.   Discussed the use of AI scribe software for clinical note transcription with the patient, who gave verbal consent to proceed.  History of Present Illness Carol Woods is an 89 year old female who presents for an annual physical exam and to discuss chronic medical conditions.  Hypertension - Essential hypertension managed with amlodipine  5 mg in the evening and lisinopril  20 mg in the morning - Occasional blood pressure spikes during stressful times, attributed to 'white coat syndrome' - No routine home blood pressure monitoring  Gait instability and falls - Recurrent falls at home - Initiated physical therapy for balance and fall prevention - Under care of sports medicine for falls and balance issues - Lives alone (she does have a cat), but has access and reports regular use of life alert  Monoclonal gammopathy of undetermined significance (mgus) - Diagnosis of monoclonal gammopathy of unknown significance - Recent changes in follow-up laboratory results noted by sports medicine, details unknown  Osteoporosis management - Osteoporosis managed by OB/GYN - Takes alendronate 70 mg weekly  Cancer screening - Last mammogram completed approximately six months ago, results normal   Most recent fall risk assessment:    04/28/2024   12:04 PM  Fall Risk   Falls in the past year? 1  Number falls in past yr: 1  Injury with Fall? 1  Comment 2 cracked ribs  Risk for fall due to : Impaired balance/gait  Follow up Falls evaluation completed;Falls prevention discussed     Most recent depression screenings:    04/29/2024    3:14 PM 05/14/2023    2:07 PM  PHQ 2/9 Scores  PHQ - 2 Score 3 0  PHQ- 9 Score 5 0      Past Medical History:   Diagnosis Date   Arthritis    back; shoulders (08/31/2014)   Atypical chest pain 11/21/2019   Essential hypertension 07/13/2018   GERD (gastroesophageal reflux disease)    OTC, changed diet   High cholesterol    History of cataract surgery 02/05/2015   2nd eye done 07/23/2015   Hypertension    Hypothyroidism    Past Surgical History:  Procedure Laterality Date   ANTERIOR CERVICAL DECOMP/DISCECTOMY FUSION  02/2010   Dr Colon   BACK SURGERY     COLONOSCOPY W/ POLYPECTOMY     DILATION AND CURETTAGE OF UTERUS     POSTERIOR LUMBAR FUSION  04/2010   Dr Colon   TONSILLECTOMY     age 46   TOTAL SHOULDER ARTHROPLASTY Right 08/31/2014   TOTAL SHOULDER ARTHROPLASTY Right 08/31/2014   Procedure: TOTAL SHOULDER ARTHROPLASTY;  Surgeon: Eva Elsie Herring, MD;  Location: [facility] OR;  Service: Orthopedics;  Laterality: Right;  Right total shoulder arthroplasty   Social History   Tobacco Use   Smoking status: Former    Current packs/day: 0.00    Average packs/day: 1.5 packs/day for 25.0 years (37.5 ttl pk-yrs)    Types: Cigarettes    Start date: 08/23/1959    Quit date: 08/22/1984    Years since quitting: 39.9   Smokeless tobacco: Never  Vaping Use   Vaping status: Never Used  Substance Use Topics   Alcohol  use: Not Currently    Comment: occasionally/  wine and mixed drinks   Drug use: No   Family History  Problem Relation Age of Onset   Breast cancer Mother    Ovarian cancer Mother    Alzheimer's disease Mother    Parkinson's disease Father    Heart disease Father    Hypothyroidism Father    Hypertension Sister    Breast cancer Sister    Kidney disease Sister    Prostate cancer Brother    Heart attack Cousin    Colon cancer Neg Hx    Esophageal cancer Neg Hx    Inflammatory bowel disease Neg Hx    Liver disease Neg Hx    Pancreatic cancer Neg Hx    Rectal cancer Neg Hx    Stomach cancer Neg Hx    No Known Allergies    Patient Care Team: [name], NP as PCP  - General (Nurse Practitioner) Raford Riggs, MD as PCP - Cardiology (Cardiology) Onita Duos, MD as Consulting Physician (Neurology) Kandyce Sor, MD as Consulting Physician (Obstetrics and Gynecology) Leslee Reusing, MD as Consulting Physician (Ophthalmology)   Outpatient Medications Prior to Visit  Medication Sig   alendronate (FOSAMAX) 70 MG tablet Take 1 tablet po q weekly as directed Oral   b complex vitamins capsule Take 1 capsule by mouth daily.   benzonatate  (TESSALON ) 100 MG capsule Take 1 capsule (100 mg total) by mouth 2 (two) times daily as needed for cough.   CALCIUM PO Take 1 tablet by mouth daily.   Cholecalciferol (VITAMIN D  PO) Take 1 tablet by mouth daily.   clindamycin (CLEOCIN T) 1 % external solution Apply 1 application topically daily.    cyanocobalamin  1000 MCG tablet Take 1,000 mcg by mouth daily.   hydrocortisone 2.5 % cream Apply 1 Application topically daily.   latanoprost (XALATAN) 0.005 % ophthalmic solution Place 1 drop into both eyes at bedtime.   levothyroxine  (SYNTHROID ) 112 MCG tablet Take 1 tablet (112 mcg total) by mouth daily before breakfast.   lisinopril  (ZESTRIL ) 20 MG tablet Take 1 tablet (20 mg total) by mouth daily.   Magnesium 500 MG CAPS Take 500 mg by mouth daily.   nirmatrelvir  & ritonavir  (PAXLOVID , 300/100,) 20 x 150 MG & 10 x 100MG  TBPK Take 3 tablets by mouth 2 (two) times daily.   Omega-3 Fatty Acids (FISH OIL PO) Take 1 capsule by mouth daily.   triamcinolone  cream (KENALOG ) 0.5 % Apply 1 Application topically 2 (two) times daily. To affected areas.   [DISCONTINUED] amLODipine  (NORVASC ) 2.5 MG tablet TAKE THREE TABLETS BY MOUTH IN THE EVENING FOR BLOOD PRESSURE 30 days   No facility-administered medications prior to visit.    Review of Systems  Cardiovascular:  Negative for chest pain and palpitations.  Musculoskeletal:  Positive for falls.  Neurological:  Positive for dizziness.          Objective:     BP (!)  176/70   Pulse 65   Temp 98 F (36.7 C) (Temporal)   Ht 5' 1 (1.549 m)   Wt 120 lb (54.4 kg)   SpO2 98%   BMI 22.67 kg/m  BP Readings from Last 3 Encounters:  07/15/24 (!) 176/70  06/13/24 128/64  05/30/24 132/70   Wt Readings from Last 3 Encounters:  07/15/24 120 lb (54.4 kg)  06/13/24 122 lb (55.3 kg)  05/30/24 121 lb (54.9 kg)      Physical Exam Vitals reviewed.  Constitutional:      Appearance: Normal appearance.  HENT:  Head: Normocephalic and atraumatic.     Right Ear: Tympanic membrane, ear canal and external ear normal.     Left Ear: Tympanic membrane, ear canal and external ear normal.  Eyes:     General:        Right eye: No discharge.        Left eye: No discharge.     Extraocular Movements: Extraocular movements intact.     Conjunctiva/sclera: Conjunctivae normal.     Pupils: Pupils are equal, round, and reactive to light.  Neck:     Vascular: No carotid bruit.  Cardiovascular:     Rate and Rhythm: Normal rate and regular rhythm.     Pulses: Normal pulses.     Heart sounds: Normal heart sounds. No murmur heard. Pulmonary:     Effort: Pulmonary effort is normal.     Breath sounds: Normal breath sounds.  Abdominal:     General: Abdomen is flat. Bowel sounds are normal. There is no distension.     Palpations: Abdomen is soft. There is no mass.     Tenderness: There is no abdominal tenderness.  Musculoskeletal:        General: No tenderness.     Cervical back: Neck supple. No muscular tenderness.     Right lower leg: No edema.     Left lower leg: No edema.  Lymphadenopathy:     Cervical: No cervical adenopathy.     Upper Body:     Right upper body: No supraclavicular adenopathy.     Left upper body: No supraclavicular adenopathy.  Skin:    General: Skin is warm and dry.  Neurological:     General: No focal deficit present.     Mental Status: She is alert and oriented to person, place, and time.     Motor: No weakness.     Gait: Gait normal.   Psychiatric:        Mood and Affect: Mood normal.        Behavior: Behavior normal.        Judgment: Judgment normal.      No results found for any visits on 07/15/24.     Assessment & Plan:    Routine Health Maintenance and Physical Exam  Immunization History  Administered Date(s) Administered   Fluad Quad(high Dose 65+) 06/22/2024   INFLUENZA, HIGH DOSE SEASONAL PF 07/28/2019   Influenza-Unspecified 06/22/2014   PFIZER(Purple Top)SARS-COV-2 Vaccination 10/07/2019, 10/28/2019, 06/26/2020   Pneumococcal Conjugate-13 11/21/2013, 12/14/2014   Pneumococcal Polysaccharide-23 09/26/2001, 03/31/2017   Tdap 11/16/2018   Zoster Recombinant(Shingrix) 06/29/2018, 11/16/2018   Zoster, Live 03/19/2012    Health Maintenance  Topic Date Due   COVID-19 Vaccine (4 - 2025-26 season) 05/23/2024   Medicare Annual Wellness (AWV)  04/29/2025   DTaP/Tdap/Td (2 - Td or Tdap) 11/16/2028   Pneumococcal Vaccine: 50+ Years  Completed   Influenza Vaccine  Completed   DEXA SCAN  Completed   Zoster Vaccines- Shingrix  Completed   Meningococcal B Vaccine  Aged Out    Discussed health benefits of physical activity, and encouraged her to engage in regular exercise appropriate for her age and condition.  Problem List Items Addressed This Visit       Cardiovascular and Mediastinum   Essential hypertension   Hypertension Blood pressure elevated, likely due to white coat syndrome and stress. Current regimen includes amlodipine  and lisinopril .  - Repeat in-office BP identified systolic pressure >160. Increase amlodipine  to 7.5 mg at night and continue lisinopril  20 mg  in the morning. - Provide prescription for new blood pressure cuff. - Instruct to monitor blood pressure at home for a week. - Bring BP logs to office visit in 1 week - Continue using life alert, reach out if dizziness, lightheadedness occurs      Relevant Medications   amLODipine  (NORVASC ) 2.5 MG tablet   Other Relevant Orders    CBC   Comprehensive metabolic panel with GFR   Hemoglobin A1c   Lipid panel   TSH   For home use only DME Other see comment   Ambulatory referral to Hematology / Oncology     Endocrine   Hypothyroidism   Relevant Orders   CBC   Comprehensive metabolic panel with GFR   Hemoglobin A1c   Lipid panel   TSH   Ambulatory referral to Hematology / Oncology     Musculoskeletal and Integument   Osteoporosis   Osteoporosis Managed by OB/GYN. On alendronate 70 mg weekly. Regular mammograms, last normal. - Continue alendronate 70 mg weekly. - Follow up with OB/GYN for osteoporosis management.        Other   MGUS (monoclonal gammopathy of unknown significance) - Primary   Monoclonal gammopathy of undetermined significance (MGUS) MGUS with upward trend in serum IgG and M protein. Requires further evaluation for potential progression. - Refer to hematology for further evaluation.      Relevant Orders   CBC   Comprehensive metabolic panel with GFR   Hemoglobin A1c   Lipid panel   TSH   Ambulatory referral to Hematology / Oncology   Falls    Falls Reports falls at home, no recent fractures. Starting physical therapy for balance and strength. Uses life alert system. - Continue physical therapy. - Ensure use of life alert system.      Hypokalemia   Hypokalemia Potassium levels low, no current supplementation.  - Check potassium levels today. - If low, start potassium supplementation and recheck in a couple of weeks. - Communicate results via MyChart and phone call.      Assessment and Plan Assessment & Plan Hypertension Blood pressure elevated, likely due to white coat syndrome and stress. Current regimen includes amlodipine  and lisinopril .  - Repeat in-office BP identified systolic pressure >160. Per shared decision making continue amlodipine5 mg at night and continue lisinopril  20 mg in the morning. - Provide prescription for new blood pressure cuff. - Instruct to  monitor blood pressure at home for a week. - Bring BP logs to office visit in 1 week - Continue using life alert, reach out if dizziness, lightheadedness occurs   Hypokalemia Potassium levels low, no current supplementation.  - Check potassium levels today. - If low, start potassium supplementation and recheck in a couple of weeks. - Communicate results via MyChart and phone call.  Monoclonal gammopathy of undetermined significance (MGUS) MGUS with upward trend in serum IgG and M protein. Requires further evaluation for potential progression. - Refer to hematology for further evaluation.  Falls Reports falls at home, no recent fractures. Starting physical therapy for balance and strength. Uses life alert system. - Continue physical therapy. - Ensure use of life alert system.  Osteoporosis Managed by OB/GYN. On alendronate 70 mg weekly. Regular mammograms, last normal. - Continue alendronate 70 mg weekly. - Follow up with OB/GYN for osteoporosis management.    Return in about 1 week (around 07/22/2024) for F/U with Kellan Raffield, BP.     Lauraine FORBES Pereyra, NP

## 2024-07-15 NOTE — Assessment & Plan Note (Signed)
 Monoclonal gammopathy of undetermined significance (MGUS) MGUS with upward trend in serum IgG and M protein. Requires further evaluation for potential progression. - Refer to hematology for further evaluation.

## 2024-07-15 NOTE — Patient Instructions (Addendum)
 Continue amlodipine  to 2 tablets by mouth at night. GOAL BLOOD PRESSURE IS <140/<85 Monitor your blood pressure daily and write this down. Bring the blood pressure readings to your next appointment.  If you experience any dizziness/fatigue/lightheadedness please call the office or send a mychart message. Keep your life alert on you in case of another fall

## 2024-07-15 NOTE — Assessment & Plan Note (Signed)
 Osteoporosis Managed by OB/GYN. On alendronate 70 mg weekly. Regular mammograms, last normal. - Continue alendronate 70 mg weekly. - Follow up with OB/GYN for osteoporosis management.

## 2024-07-15 NOTE — Assessment & Plan Note (Signed)
 Hypokalemia Potassium levels low, no current supplementation.  - Check potassium levels today. - If low, start potassium supplementation and recheck in a couple of weeks. - Communicate results via MyChart and phone call.

## 2024-07-21 NOTE — Progress Notes (Signed)
 [facility] [address] [place] [address] Phone: [phone] Subjective:   ISusannah Gully, am serving as a scribe for Dr. Arthea Claudene.  I'm seeing this patient by the request  of:  [name], NP  CC: Multiple problems follow-up  [name]  06/13/2024 With patient also having more posterior tibialis tendinitis.  This is difficult with patient having some mild weakness noted.  Discussed a very small 1/16 inch of a heel lift.  Do not want to make it significant with patient having weakness already of picking at the toe.  Discussed icing regimen and home exercises otherwise.  Follow-up again in 6 to 8 weeks     Severe overall.  Most recent MRI showed significant progression.  Patient feels like today she may not be in good enough health to do surgery at a later date and social determinants of health includes patient's decreasing physical activity and is making patient significantly more stress.  Will refer patient for a second opinion with neurosurgery to discuss if surgical intervention or conservative therapy is better.     Multifactorial.  Does have significant degenerative disc disease of the cervical spine that makes him nervous.  Also has MGUS.  We discussed with patient about what to do next.  Patient is concerned this is worsening at this time and did have a recent fall.  Will start with formal physical therapy to help but also get another referral to neurosurgeon to discuss for patient's recommendations.      Update 07/25/2024 NATOSHA BOU is a 89 y.o. female coming in with complaint of L foot drop, neck, and gait abnormality. Initial PT eval 07/14/2024.  In addition to this being followed again for MGUS.  Patient states foot is still a bit sore. Starts PT tomorrow.  Most recent labs by primary care show that patient is having difficulty with thyroid  and increased levothyroxine  to 125 mcg just 1 week ago.    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 severe spinal stenosis  Past Medical History:  Diagnosis Date   Arthritis    back; shoulders (08/31/2014)   Atypical chest pain 11/21/2019   Essential hypertension 07/13/2018   GERD (gastroesophageal reflux disease)    OTC, changed diet   High cholesterol    History of cataract surgery 02/05/2015   2nd eye done 07/23/2015   Hypertension    Hypothyroidism    Past Surgical History:  Procedure Laterality Date   ANTERIOR CERVICAL DECOMP/DISCECTOMY FUSION  02/2010   Dr Colon   BACK SURGERY     COLONOSCOPY W/ POLYPECTOMY     DILATION AND CURETTAGE OF UTERUS     POSTERIOR LUMBAR FUSION  04/2010   Dr Colon   TONSILLECTOMY     age 22   TOTAL SHOULDER ARTHROPLASTY Right 08/31/2014   TOTAL SHOULDER ARTHROPLASTY Right 08/31/2014   Procedure: TOTAL SHOULDER ARTHROPLASTY;  Surgeon: Eva Elsie Herring, MD;  Location: [facility] OR;  Service: Orthopedics;  Laterality: Right;  Right total shoulder arthroplasty   Social History   Socioeconomic History   Marital status: Divorced    Spouse name: Not on file   Number of children: Not on file   Years of education: Not on file   Highest education level: Master's degree (e.g., MA, MS, MEng, MEd, MSW, MBA)  Occupational History   Occupation: RETIRED  Tobacco Use  Smoking status: Former    Current packs/day: 0.00    Average packs/day: 1.5 packs/day for 25.0 years (37.5 ttl pk-yrs)    Types: Cigarettes    Start date: 08/23/1959    Quit date: 08/22/1984    Years since quitting: 39.9   Smokeless tobacco: Never  Vaping Use   Vaping status: Never Used  Substance and Sexual Activity   Alcohol  use: Not Currently    Comment: occasionally/ wine and mixed drinks   Drug use: No   Sexual activity: Not on file  Other Topics Concern   Not on file  Social History Narrative   Lives alone and has a  cat/2025   Social Drivers of Health   Financial Resource Strain: Low Risk  (04/28/2024)   Overall Financial Resource Strain (CARDIA)    Difficulty of Paying Living Expenses: Not hard at all  Food Insecurity: No Food Insecurity (04/28/2024)   Hunger Vital Sign    Worried About Running Out of Food in the Last Year: Never true    Ran Out of Food in the Last Year: Never true  Transportation Needs: No Transportation Needs (04/28/2024)   PRAPARE - Administrator, Civil Service (Medical): No    Lack of Transportation (Non-Medical): No  Physical Activity: Inactive (04/28/2024)   Exercise Vital Sign    Days of Exercise per Week: 0 days    Minutes of Exercise per Session: Not on file  Stress: Stress Concern Present (04/29/2024)   [employer] of Occupational Health - Occupational Stress Questionnaire    Feeling of Stress: Rather much  Social Connections: Unknown (04/28/2024)   Social Connection and Isolation Panel    Frequency of Communication with Friends and Family: Not on file    Frequency of Social Gatherings with Friends and Family: Not on file    Attends Religious Services: More than 4 times per year    Active Member of [employer] or Organizations: Yes    Attends Engineer, Structural: More than 4 times per year    Marital Status: Divorced   No Known Allergies Family History  Problem Relation Age of Onset   Breast cancer Mother    Ovarian cancer Mother    Alzheimer's disease Mother    Parkinson's disease Father    Heart disease Father    Hypothyroidism Father    Hypertension Sister    Breast cancer Sister    Kidney disease Sister    Prostate cancer Brother    Heart attack Cousin    Colon cancer Neg Hx    Esophageal cancer Neg Hx    Inflammatory bowel disease Neg Hx    Liver disease Neg Hx    Pancreatic cancer Neg Hx    Rectal cancer Neg Hx    Stomach cancer Neg Hx     Current Outpatient Medications (Endocrine & Metabolic):    alendronate (FOSAMAX) 70 MG  tablet, Take 1 tablet po q weekly as directed Oral   levothyroxine  (SYNTHROID ) 125 MCG tablet, Take 1 tablet (125 mcg total) by mouth daily.  Current Outpatient Medications (Cardiovascular):    amLODipine  (NORVASC ) 2.5 MG tablet, Take 2 tablets (5 mg total) by mouth daily.   lisinopril  (ZESTRIL ) 20 MG tablet, Take 1 tablet (20 mg total) by mouth daily.  Current Outpatient Medications (Respiratory):    benzonatate  (TESSALON ) 100 MG capsule, Take 1 capsule (100 mg total) by mouth 2 (two) times daily as needed for cough.   Current Outpatient Medications (Hematological):    cyanocobalamin   1000 MCG tablet, Take 1,000 mcg by mouth daily.  Current Outpatient Medications (Other):    b complex vitamins capsule, Take 1 capsule by mouth daily.   CALCIUM PO, Take 1 tablet by mouth daily.   Cholecalciferol (VITAMIN D  PO), Take 1 tablet by mouth daily.   clindamycin (CLEOCIN T) 1 % external solution, Apply 1 application topically daily.    hydrocortisone 2.5 % cream, Apply 1 Application topically daily.   latanoprost (XALATAN) 0.005 % ophthalmic solution, Place 1 drop into both eyes at bedtime.   Magnesium 500 MG CAPS, Take 500 mg by mouth daily.   nirmatrelvir  & ritonavir  (PAXLOVID , 300/100,) 20 x 150 MG & 10 x 100MG  TBPK, Take 3 tablets by mouth 2 (two) times daily.   Omega-3 Fatty Acids (FISH OIL PO), Take 1 capsule by mouth daily.   potassium chloride SA (KLOR-CON M) 20 MEQ tablet, Take 1 tablet (20 mEq total) by mouth daily.   triamcinolone  cream (KENALOG ) 0.5 %, Apply 1 Application topically 2 (two) times daily. To affected areas.   Reviewed prior external information including notes and imaging from  primary care provider As well as notes that were available from care everywhere and other healthcare systems.  Past medical history, social, surgical and family history all reviewed in electronic medical record.  No pertanent information unless stated regarding to the chief complaint.   Review  of Systems:  No headache, visual changes, nausea, vomiting, diarrhea, constipation, dizziness, abdominal pain, skin rash, fevers, chills, night sweats, weight loss, swollen lymph nodes, body aches, joint swelling, chest pain, shortness of breath, mood changes. POSITIVE muscle aches  Objective  Blood pressure 130/76, pulse 96, height 5' 1 (1.549 m), weight 122 lb (55.3 kg), SpO2 (!) 76%.   General: No apparent distress alert and oriented x3 mood and affect normal, dressed appropriately.  HEENT: Pupils equal, extraocular movements intact  Respiratory: Patient's speak in full sentences and does not appear short of breath  Significant difficulty with ambulation.  Patient has shuffling gait.  Weakness noted of both feet with dorsi flexion but also plantarflexion noted on the left side.  Patient even does have a beat of clonus on the left side.  Hand exam does have weakness with symmetric    Impression and Recommendations:     The above documentation has been reviewed and is accurate and complete Erland Vivas M Jair Lindblad, DO

## 2024-07-22 DIAGNOSIS — H401132 Primary open-angle glaucoma, bilateral, moderate stage: Secondary | ICD-10-CM | POA: Diagnosis not present

## 2024-07-25 ENCOUNTER — Ambulatory Visit: Admitting: Family Medicine

## 2024-07-25 VITALS — BP 130/76 | HR 96 | Ht 61.0 in | Wt 122.0 lb

## 2024-07-25 DIAGNOSIS — M4802 Spinal stenosis, cervical region: Secondary | ICD-10-CM | POA: Diagnosis not present

## 2024-07-25 DIAGNOSIS — M21372 Foot drop, left foot: Secondary | ICD-10-CM | POA: Diagnosis not present

## 2024-07-25 NOTE — Assessment & Plan Note (Signed)
 In a concern that the spinal stenosis is getting the best of her.  Encouraged her to potentially follow-up with her neurosurgeon.  Patient still would not want to do any type of surgical intervention if not necessary.  Discussed icing regimen.  Discussed continuing the other medications.  No is going to do physical therapy which I think will be significantly beneficial for this person to even decrease her risk of injuries.  Follow-up with me again in 6 to 8 weeks otherwise.

## 2024-07-25 NOTE — Assessment & Plan Note (Addendum)
 AFO would be beneficial for this individual.  Referred patient again today. I personally spent a total of 33 minutes in the care of the patient today including preparing to see the patient, performing a medically appropriate exam/evaluation, counseling and educating, placing orders, documenting clinical information in the EHR, communicating results, and coordinating care.

## 2024-07-25 NOTE — Patient Instructions (Signed)
 [facility] AFO L foot Heel lift in toe of shoes See you again in 3 months

## 2024-07-26 ENCOUNTER — Ambulatory Visit: Attending: Family Medicine | Admitting: Physical Therapy

## 2024-07-26 VITALS — BP 152/54 | HR 72

## 2024-07-26 DIAGNOSIS — M6281 Muscle weakness (generalized): Secondary | ICD-10-CM | POA: Insufficient documentation

## 2024-07-26 DIAGNOSIS — R2689 Other abnormalities of gait and mobility: Secondary | ICD-10-CM | POA: Insufficient documentation

## 2024-07-26 DIAGNOSIS — R2681 Unsteadiness on feet: Secondary | ICD-10-CM | POA: Diagnosis not present

## 2024-07-26 NOTE — Therapy (Signed)
 OUTPATIENT PHYSICAL THERAPY NEURO TREATMENT   Patient Name: Carol Woods MRN: [id number] DOB:08-10-1934, 89 y.o., female Today's Date: 07/27/2024   PCP: Elnor Lauraine FORBES, NP REFERRING PROVIDER: Claudene Arthea HERO, DO  END OF SESSION:  PT End of Session - 07/26/24 1515     Visit Number 2    Number of Visits 17   with eval   Date for Recertification  09/22/24    Authorization Type [employer]    PT Start Time 1401    PT Stop Time 1447    PT Time Calculation (min) 46 min    Equipment Utilized During Treatment Gait belt    Activity Tolerance Patient tolerated treatment well    Behavior During Therapy WFL for tasks assessed/performed           Past Medical History:  Diagnosis Date   Arthritis    back; shoulders (08/31/2014)   Atypical chest pain 11/21/2019   Essential hypertension 07/13/2018   GERD (gastroesophageal reflux disease)    OTC, changed diet   High cholesterol    History of cataract surgery 02/05/2015   2nd eye done 07/23/2015   Hypertension    Hypothyroidism    Past Surgical History:  Procedure Laterality Date   ANTERIOR CERVICAL DECOMP/DISCECTOMY FUSION  02/2010   Dr Colon   BACK SURGERY     COLONOSCOPY W/ POLYPECTOMY     DILATION AND CURETTAGE OF UTERUS     POSTERIOR LUMBAR FUSION  04/2010   Dr Colon   TONSILLECTOMY     age 67   TOTAL SHOULDER ARTHROPLASTY Right 08/31/2014   TOTAL SHOULDER ARTHROPLASTY Right 08/31/2014   Procedure: TOTAL SHOULDER ARTHROPLASTY;  Surgeon: Eva Elsie Herring, MD;  Location: [facility] OR;  Service: Orthopedics;  Laterality: Right;  Right total shoulder arthroplasty   Patient Active Problem List   Diagnosis Date Noted   Osteoporosis 07/15/2024   Degenerative arthritis of left knee 03/17/2024   Dry skin 03/17/2024   COVID-19 04/23/2023   Spinal stenosis in cervical region 04/15/2023   Diarrhea 03/06/2023   Falls 03/06/2023   Fatigue 03/06/2023   Forgetfulness 03/06/2023   Light headedness 03/06/2023   Mass of  hard palate 03/06/2023   Hypokalemia 03/06/2023   Hypothyroidism 03/06/2023   Hyperlipidemia 03/06/2023   Hypocalcemia 03/06/2023   Contusion of chest 02/05/2023   Left foot drop 10/31/2022   Pill dysphagia 07/11/2022   Change in bowel habits 07/11/2022   History of colonic polyps 07/11/2022   GERD (gastroesophageal reflux disease) 11/21/2019   Atypical chest pain 11/21/2019   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   Essential hypertension 07/13/2018   Lumbar spondylosis 06/10/2018   Thoracic spinal stenosis 03/18/2018   Spinal stenosis of lumbar region 03/18/2018   Gait abnormality 02/04/2018   Paresthesia 02/04/2018   MGUS (monoclonal gammopathy of unknown significance) 12/02/2017   Status post total shoulder arthroplasty 08/31/2014    ONSET DATE: 06/13/2024 (referral date)  REFERRING DIAG: R26.9 (ICD-10-CM) - Gait abnormality R26.89 (ICD-10-CM) - Balance problem  THERAPY DIAG:  Unsteadiness on feet  Other abnormalities of gait and mobility  Muscle weakness (generalized)  Rationale for Evaluation and Treatment: Rehabilitation  SUBJECTIVE:  SUBJECTIVE STATEMENT: Patient ambulates into clinic today with stand alone cane. Patient distracted with conversional and visual distractions when walking into clinic causing patient to be unbalanced and walking sideway/backwards. Patent states they were doing ok until having to find her way to PT. She got a little lost on the way here but had a guy at [employer] help her find her way. Patient denies falls. She uses her walker in her yard but doesn't use it other than that. Plans to [facility] to work on adjusting AFOs and determine if she will wear them.  Pt accompanied by: self  PERTINENT HISTORY: PMH: HTN,  Hypothyroidism, arthritis,spinal stenosis, L foot drop, neuropathy  PAIN:  Are you having pain? Yes: NPRS scale: not rated Pain location: chronic pain - pain in shoulders, arms, lower back all sometimes Pain description: not described Aggravating factors: not described Relieving factors: not described  PRECAUTIONS: Fall  RED FLAGS: None   WEIGHT BEARING RESTRICTIONS: No  FALLS: Has patient fallen in last 6 months? Yes. Number of falls see subjective from eval  LIVING ENVIRONMENT: Lives with: lives alone Lives in: House/apartment Stairs: Yes: External: 4 steps; on right going up Has following equipment at home: stand alone cane  PLOF: Independent with gait, Independent with transfers, and Requires assistive device for independence  PATIENT GOALS: be able to walk better, toss my cane, but not sure that will ever happen stay out of a wheelchair or walker - does use walker occasionally - in the yard I would like to not have surgery - Dr. Claudene thinks she needs to have it  OBJECTIVE:  Note: Objective measures were completed at Evaluation unless otherwise noted.  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  COGNITION: Overall cognitive status: reports memory impairments for about a year   SENSATION: Neuropathy in BLE (L>R)  COORDINATION: Impaired in BLE  EDEMA:  Feet and ankles get swollen sometimes   POSTURE: rounded shoulders, forward head, increased thoracic kyphosis, and posterior pelvic tilt  LOWER EXTREMITY ROM:     Active  Right Eval Left Eval  Hip flexion    Hip extension    Hip abduction    Hip adduction    Hip internal rotation    Hip external rotation    Knee flexion    Knee extension    Ankle dorsiflexion Tight gastroc Tight gastroc  Ankle plantarflexion    Ankle inversion    Ankle eversion     (Blank rows =  not tested)  LOWER EXTREMITY MMT:    MMT Right Eval Left Eval  Hip flexion 5 5  Hip extension    Hip abduction    Hip adduction    Hip internal rotation    Hip external rotation    Knee flexion 3 3  Knee extension 4 4  Ankle dorsiflexion 3- 2-  Ankle plantarflexion    Ankle inversion    Ankle eversion    (Blank rows = not tested)  BED MOBILITY:  Mod I via logroll per patient report  TRANSFERS: Sit to stand: SBA  Assistive device utilized: None     Stand to sit: SBA  Assistive device utilized: None     Chair to chair: SBA  Assistive device utilized: None       RAMP:  Not tested  CURB:  Not tested  STAIRS: Not tested GAIT: Findings:  Gait pattern: decreased hip/knee flexion- Left, decreased ankle dorsiflexion- Right, decreased ankle dorsiflexion- Left, poor foot clearance- Right, and poor foot clearance- Left Distance walked: various clinic distances Assistive device utilized: stand alone cane Level of assistance: Min A Comments: to be further assessed   FUNCTIONAL TESTS:     07/26/24 0001  Standardized Balance Assessment  Standardized Balance Assessment Berg Balance Test  Berg Balance Test  Sit to Stand 3 (doesn't use hands but very unstead and often needs time to stabilize or falls backwards but doesn't use assistance)  Standing Unsupported 0 (Only able to stand once patient orients themselves or uses chair ot balance)  Sitting with Back Unsupported but Feet Supported on Floor or Stool 4  Stand to Sit 1  Transfers 2  Standing Unsupported with Eyes Closed 0 (Only able to stand once patient orients themselves or uses chair ot balance)  Standing Unsupported with Feet Together 1 (Only able to stand once patient orients themselves or uses chair ot balance)  From Standing, Reach Forward with Outstretched Arm 0 (Must hold on to chair)  From Standing Position, Pick up Object from Floor 0  From Standing Position, Turn to Look Behind Over each Shoulder  0 (Must hold on to chair)  Turn 360 Degrees 1  Standing Unsupported, Alternately Place Feet on Step/Stool 1  Standing Unsupported, One Foot in Front 1  Standing on One Leg 0  Total Score 14  Berg comment: High risk for falls                                                                                                                                  TREATMENT DATE:  07/26/2024 Therapeutic Activity: Lars balance assessment, 14/56 high risk of falls Education on risk for falls and need to use walker instead of cane during ambulation. Patient does not agree to this but is willing to try using a walker in the clinic but states she would not be using it at home. Sit to stand x 5 reps with CGA Single leg stance with chair hold assist x 30 seconds each side Ambulation into and out of clinic with SBA and cueing on focus on gait, task planning and DF to decrease fall risk   07/26/24 0001  Standardized Balance Assessment  Standardized Balance Assessment Berg Balance Test  Berg Balance Test  Sit to Stand 3 (doesn't use hands but very unstead and often needs time to stabilize or falls backwards but doesn't use assistance)  Standing Unsupported 0 (Only able to stand once patient orients themselves or uses chair ot balance)  Sitting with Back Unsupported but Feet Supported on Floor or Stool 4  Stand to Sit 1  Transfers 2  Standing Unsupported with Eyes Closed 0 (Only able to stand once patient orients themselves or uses chair ot balance)  Standing Unsupported with Feet Together 1 (Only able to stand once patient orients themselves or uses chair ot balance)  From Standing, Reach Forward with Outstretched Arm 0 (Must hold on to chair)  From Standing Position, Pick up Object from Floor 0  From Standing Position, Turn to Look Behind Over each Shoulder 0 (Must hold on to chair)  Turn 360 Degrees 1  Standing Unsupported, Alternately Place Feet on Step/Stool 1  Standing Unsupported, One Foot  in Front 1  Standing on One Leg 0  Total Score 14  Berg comment: High risk for falls     PATIENT EDUCATION: Education details: Provided initial HEP, recommended patient to use her walker but she states that she will not do so out and about but might use her walker in her yard some because the ground is so uneven and she uses it to get up off the ground if she is outside. Person educated: Patient Education method: Medical Illustrator Education comprehension: verbalized understanding, returned demonstration, and needs further education  HOME EXERCISE PROGRAM:  Access Code: [email or web address] URL: [email or web address] Date: 07/26/2024 Prepared by: [place] [facility] Exercises  - Seated March  - 1-2 x daily - 5 x weekly - 2 sets - 10 reps  - Seated Heel Raise  - 1-2 x daily - 5 x weekly - 2 sets - 10 reps  - Seated Toe Raise  - 1-2 x daily - 5 x weekly - 2 sets - 10 reps   GOALS: Goals reviewed with patient? Yes  SHORT TERM GOALS: Target date: 08/11/2024  Pt will be independent with initial HEP for improved strength, balance, transfers and gait. Baseline: Goal status: INITIAL  2.  Pt will improve gait velocity to at least 1.75 ft/sec for improved gait efficiency and performance at CGA level  Baseline: 1.66 ft/sec with stand alone cane min A (10/23) Goal status: INITIAL  3.  Pt will improve 5 x STS to less than or equal to 25 seconds to demonstrate improved functional strength and transfer efficiency.  Baseline: 29.4 sec with no UE but posterior LOB and min A (10/23) Goal status: INITIAL  4.  Pt will improve normal TUG to less than or equal to 16 seconds for improved functional mobility and decreased fall risk. Baseline: 19.35 sec with stand alone cane and min A (10/23) Goal status: INITIAL  5.  Berg balance assessment will improve from 14/56 to at least 19/56 in order to demonstrate decreased risk of falls. Baseline: 14/56  Goal status: INITIAL    LONG TERM  GOALS: Target date: 09/08/2024   Pt will be independent with final HEP for improved strength, balance, transfers and gait. Baseline:  Goal status: INITIAL  2.  Pt will improve gait velocity to at least 2.0 ft/sec for improved gait efficiency and performance at SBA level  Baseline: 1.66 ft/sec with stand alone cane min A (10/23) Goal status: INITIAL  3.  Pt will improve 5 x STS to less than or equal to 21 seconds to demonstrate improved functional strength and transfer efficiency.  Baseline:  29.4 sec with no UE but posterior LOB and min A (10/23) Goal status: INITIAL  4.  Pt will improve normal TUG to less than or equal to 13 seconds for improved functional mobility and decreased fall risk. Baseline: 19.35 sec with stand alone cane and min A (10/23) Goal status: INITIAL  5.  Berg balance assessment will improve to at least 24/56 in order to demonstrate decreased risk of falls. Baseline: 14/56 Goal status: INITIAL    ASSESSMENT:  CLINICAL IMPRESSION: Patient was seen today for skilled physical therapy with an  emphasis on balance, gait and completion of berg balance scale. Patient continues to demonstrate increased risk for falls with low berg balance score in addition to deficits in attention during task completion and ambulation. Patient continues to sit down without control during sit to stands and when losing balance in standing. Cueing was used throughout session to direction focus and prevent loss of balance. CGA assist was used with throughout session with Min Assist needed due to LOB during balance tasks such as standing unsupported, standing with eyes closed, feet together, reaching in standing, picking up object from floor and looking behind. In order to complete the berg assessment, patient needed min assist and/or UE contact with a chair. Pt would benefit from skilled PT to address these impairments and functional limitations to maximize functional mobility  independence.   OBJECTIVE IMPAIRMENTS: Abnormal gait, decreased balance, decreased cognition, decreased coordination, difficulty walking, decreased ROM, decreased strength, impaired perceived functional ability, impaired sensation, improper body mechanics, postural dysfunction, and pain.   ACTIVITY LIMITATIONS: carrying, lifting, bending, standing, stairs, transfers, bed mobility, and locomotion level  PARTICIPATION LIMITATIONS: meal prep, cleaning, laundry, driving, community activity, and yard work  PERSONAL FACTORS: Age, Time since onset of injury/illness/exacerbation, and 3+ comorbidities:   PMH: HTN, Hypothyroidism, arthritis,spinal stenosis, L foot drop, neuropathyare also affecting patient's functional outcome.   REHAB POTENTIAL: Fair has tried PT in the past, buy-in regarding AD recommendations, age, cervical spinal stenosis, neuropathy, foot drop  CLINICAL DECISION MAKING: Unstable/unpredictable  EVALUATION COMPLEXITY: High  PLAN:  PT FREQUENCY: 2x/week  PT DURATION: 8 weeks  PLANNED INTERVENTIONS: 97164- PT Re-evaluation, 97750- Physical Performance Testing, 97110-Therapeutic exercises, 97530- Therapeutic activity, [id number]- Neuromuscular re-education, 97535- Self Care, [id number]- Manual therapy, [id number]- Gait training, [phone]- Orthotic Initial, [phone]- Orthotic/Prosthetic subsequent, [phone]- Aquatic Therapy, [phone]- Electrical stimulation (manual), [phone] (1-2 muscles), [id number] (3+ muscles)- Dry Needling, Patient/Family education, Balance training, Stair training, Taping, Joint mobilization, Spinal mobilization, Cognitive remediation, DME instructions, Cryotherapy, and Moist heat  PLAN FOR NEXT SESSION:  assess B hip abd strength and address wasting of L glutes, work on LE placement and anterior weight shift with sit to stands, toe taps in standing, quad and gluteal strengthening in standing and seated/supine. Add to HEP accordingly (I.e. seated and supine until safe to perform standing at  home).   Emmalene Sherry, Student-PT  07/27/2024, 12:23 PM

## 2024-07-27 DIAGNOSIS — F330 Major depressive disorder, recurrent, mild: Secondary | ICD-10-CM | POA: Diagnosis not present

## 2024-07-28 ENCOUNTER — Encounter: Payer: Self-pay | Admitting: Physical Therapy

## 2024-07-28 ENCOUNTER — Ambulatory Visit: Payer: Self-pay | Admitting: Nurse Practitioner

## 2024-07-28 ENCOUNTER — Ambulatory Visit: Admitting: Nurse Practitioner

## 2024-07-28 ENCOUNTER — Ambulatory Visit: Admitting: Physical Therapy

## 2024-07-28 VITALS — BP 152/64 | HR 70 | Temp 98.2°F | Ht 61.0 in | Wt 120.2 lb

## 2024-07-28 DIAGNOSIS — R2681 Unsteadiness on feet: Secondary | ICD-10-CM

## 2024-07-28 DIAGNOSIS — I10 Essential (primary) hypertension: Secondary | ICD-10-CM

## 2024-07-28 DIAGNOSIS — R2689 Other abnormalities of gait and mobility: Secondary | ICD-10-CM | POA: Diagnosis not present

## 2024-07-28 DIAGNOSIS — E876 Hypokalemia: Secondary | ICD-10-CM

## 2024-07-28 DIAGNOSIS — M6281 Muscle weakness (generalized): Secondary | ICD-10-CM

## 2024-07-28 LAB — BASIC METABOLIC PANEL WITH GFR
BUN: 22 mg/dL (ref 6–23)
CO2: 33 meq/L — ABNORMAL HIGH (ref 19–32)
Calcium: 9.9 mg/dL (ref 8.4–10.5)
Chloride: 100 meq/L (ref 96–112)
Creatinine, Ser: 0.68 mg/dL (ref 0.40–1.20)
GFR: 77.83 mL/min (ref >60.00)
Glucose, Bld: 123 mg/dL — ABNORMAL HIGH (ref 70–99)
Potassium: 2.9 meq/L — ABNORMAL LOW (ref 3.5–5.1)
Sodium: 140 meq/L (ref 135–145)

## 2024-07-28 MED ORDER — AMLODIPINE BESYLATE 2.5 MG PO TABS: 7.5000 mg | ORAL_TABLET | Freq: Every day | ORAL | 6 refills | Status: AC

## 2024-07-28 NOTE — Assessment & Plan Note (Signed)
 Hypokalemia Previous low potassium levels. Dietary potassium intake increased. Concerns about potassium supplementation due to bowel issues and pill size. Discussed alternative administration methods. - Ordered blood test to check current potassium levels. - If potassium levels are improving, continue dietary potassium intake.

## 2024-07-28 NOTE — Therapy (Signed)
 OUTPATIENT PHYSICAL THERAPY NEURO TREATMENT   Patient Name: Carol Woods MRN: [id number] DOB:02/19/1934, 89 y.o., female Today's Date: 07/28/2024   PCP: Elnor Lauraine FORBES, NP REFERRING PROVIDER: Claudene Arthea HERO, DO  END OF SESSION:  PT End of Session - 07/28/24 1949     Visit Number 3    Number of Visits 17   with eval   Date for Recertification  09/22/24    Authorization Type [employer]    PT Start Time 1103    PT Stop Time 1146    PT Time Calculation (min) 43 min    Equipment Utilized During Treatment Gait belt    Activity Tolerance Patient tolerated treatment well    Behavior During Therapy WFL for tasks assessed/performed            Past Medical History:  Diagnosis Date   Arthritis    back; shoulders (08/31/2014)   Atypical chest pain 11/21/2019   Essential hypertension 07/13/2018   GERD (gastroesophageal reflux disease)    OTC, changed diet   High cholesterol    History of cataract surgery 02/05/2015   2nd eye done 07/23/2015   Hypertension    Hypothyroidism    Past Surgical History:  Procedure Laterality Date   ANTERIOR CERVICAL DECOMP/DISCECTOMY FUSION  02/2010   Dr Colon   BACK SURGERY     COLONOSCOPY W/ POLYPECTOMY     DILATION AND CURETTAGE OF UTERUS     POSTERIOR LUMBAR FUSION  04/2010   Dr Colon   TONSILLECTOMY     age 63   TOTAL SHOULDER ARTHROPLASTY Right 08/31/2014   TOTAL SHOULDER ARTHROPLASTY Right 08/31/2014   Procedure: TOTAL SHOULDER ARTHROPLASTY;  Surgeon: Eva Elsie Herring, MD;  Location: [facility] OR;  Service: Orthopedics;  Laterality: Right;  Right total shoulder arthroplasty   Patient Active Problem List   Diagnosis Date Noted   Osteoporosis 07/15/2024   Degenerative arthritis of left knee 03/17/2024   Dry skin 03/17/2024   COVID-19 04/23/2023   Spinal stenosis in cervical region 04/15/2023   Diarrhea 03/06/2023   Falls 03/06/2023   Fatigue 03/06/2023   Forgetfulness 03/06/2023   Light headedness 03/06/2023   Mass  of hard palate 03/06/2023   Hypokalemia 03/06/2023   Hypothyroidism 03/06/2023   Hyperlipidemia 03/06/2023   Hypocalcemia 03/06/2023   Contusion of chest 02/05/2023   Left foot drop 10/31/2022   Pill dysphagia 07/11/2022   Change in bowel habits 07/11/2022   History of colonic polyps 07/11/2022   GERD (gastroesophageal reflux disease) 11/21/2019   Atypical chest pain 11/21/2019   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   Essential hypertension 07/13/2018   Lumbar spondylosis 06/10/2018   Thoracic spinal stenosis 03/18/2018   Spinal stenosis of lumbar region 03/18/2018   Gait abnormality 02/04/2018   Paresthesia 02/04/2018   MGUS (monoclonal gammopathy of unknown significance) 12/02/2017   Status post total shoulder arthroplasty 08/31/2014    ONSET DATE: 06/13/2024 (referral date)  REFERRING DIAG: R26.9 (ICD-10-CM) - Gait abnormality R26.89 (ICD-10-CM) - Balance problem  THERAPY DIAG:  Unsteadiness on feet  Other abnormalities of gait and mobility  Muscle weakness (generalized)  Rationale for Evaluation and Treatment: Rehabilitation  SUBJECTIVE:  SUBJECTIVE STATEMENT: Patient says she is a little wound up today - has a house guest today and tonight and that has made her a little anxious.  Feels more jittery today than she did at previous PT session this week.  Has not called [facility] yet for an appt. For AFO's.   Pt accompanied by: self  PERTINENT HISTORY: PMH: HTN, Hypothyroidism, arthritis,spinal stenosis, L foot drop, neuropathy  PAIN:  Are you having pain? Yes: NPRS scale: not rated Pain location: chronic pain - pain in shoulders, arms, lower back all sometimes Pain description: not described Aggravating factors: not described Relieving factors:  not described  PRECAUTIONS: Fall  RED FLAGS: None   WEIGHT BEARING RESTRICTIONS: No  FALLS: Has patient fallen in last 6 months? Yes. Number of falls see subjective from eval  LIVING ENVIRONMENT: Lives with: lives alone Lives in: House/apartment Stairs: Yes: External: 4 steps; on right going up Has following equipment at home: stand alone cane  PLOF: Independent with gait, Independent with transfers, and Requires assistive device for independence  PATIENT GOALS: be able to walk better, toss my cane, but not sure that will ever happen stay out of a wheelchair or walker - does use walker occasionally - in the yard I would like to not have surgery - Dr. Claudene thinks she needs to have it  OBJECTIVE:  Note: Objective measures were completed at Evaluation unless otherwise noted.  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  COGNITION: Overall cognitive status: reports memory impairments for about a year   SENSATION: Neuropathy in BLE (L>R)  COORDINATION: Impaired in BLE  EDEMA:  Feet and ankles get swollen sometimes   POSTURE: rounded shoulders, forward head, increased thoracic kyphosis, and posterior pelvic tilt  LOWER EXTREMITY ROM:     Active  Right Eval Left Eval  Hip flexion    Hip extension    Hip abduction    Hip adduction    Hip internal rotation    Hip external rotation    Knee flexion    Knee extension    Ankle dorsiflexion Tight gastroc Tight gastroc  Ankle plantarflexion    Ankle inversion    Ankle eversion     (Blank rows = not tested)  LOWER EXTREMITY MMT:    MMT Right Eval Left Eval  Hip flexion 5 5  Hip extension    Hip abduction    Hip adduction    Hip internal rotation    Hip external rotation    Knee flexion 3 3  Knee extension 4 4  Ankle dorsiflexion 3- 2-  Ankle plantarflexion    Ankle  inversion    Ankle eversion    (Blank rows = not tested)  BED MOBILITY:  Mod I via logroll per patient report  TRANSFERS: Sit to stand: SBA  Assistive device utilized: None     Stand to sit: SBA  Assistive device utilized: None     Chair to chair: SBA  Assistive device utilized: None       RAMP:  Not tested  CURB:  Not tested  STAIRS: Not tested GAIT: Findings:  Gait pattern: decreased hip/knee flexion- Left, decreased ankle dorsiflexion- Right, decreased ankle dorsiflexion- Left, poor foot clearance- Right, and poor foot clearance- Left Distance walked: various clinic distances Assistive device utilized: stand alone cane Level of assistance: Min A Comments: to be further assessed  FUNCTIONAL TESTS:     07/26/24 0001  Standardized Balance Assessment  Standardized Balance Assessment Berg Balance Test  Berg Balance Test  Sit to Stand 3 (doesn't use hands but very unstead and often needs time to stabilize or falls backwards but doesn't use assistance)  Standing Unsupported 0 (Only able to stand once patient orients themselves or uses chair ot balance)  Sitting with Back Unsupported but Feet Supported on Floor or Stool 4  Stand to Sit 1  Transfers 2  Standing Unsupported with Eyes Closed 0 (Only able to stand once patient orients themselves or uses chair ot balance)  Standing Unsupported with Feet Together 1 (Only able to stand once patient orients themselves or uses chair ot balance)  From Standing, Reach Forward with Outstretched Arm 0 (Must hold on to chair)  From Standing Position, Pick up Object from Floor 0  From Standing Position, Turn to Look Behind Over each Shoulder 0 (Must hold on to chair)  Turn 360 Degrees 1  Standing Unsupported, Alternately Place Feet on Step/Stool 1  Standing Unsupported, One Foot in Front 1  Standing on One Leg 0  Total Score 14  Berg comment: High risk for falls                                                                                                                                   TREATMENT DATE:  07-28-24  Gait:   115' without device with CGA min assist; pt had 1 occurrence of Lt toes catching floor but did not lose balance  Step negotiation - pt used bil. Handrails - used step over step sequence with ascension and step by step sequence with descension with SBA  TherAct: Pt performed sit to stand transfers from chair - 5 reps without UE support with CGA to min assist: some uncontrolled descent on some reps due to LOB posteriorly; 2nd set - pt performed sit to stand and then used UE support on locked computer table placed in front of her to assist in balance recovery prn - pt performed 5 reps with CGA  Pt performed step ups RLE and LLE 10 reps each with bil. UE support on handrails  Performed tap ups to 1st step 10 reps each leg with UE support on rails with CGA  Rockerboard inside // bars with bil. UE support with CGA - 10 reps x 2 sets; pt unable to rock forward with knees extended due to bil. Plantarflexor weakness  TherEx: Standing hip extension (no weight) 10 reps each leg; bil. UE support on // bar;  standing hip abduction 10 reps RLE and LLE (no weight); added these exercises to HEP   Access Code: [email or web address] URL: [email or web address] Date: 07/28/2024 Prepared by: Rock Kussmaul  Exercises - bolded exercises added to previous HEP - Seated March  - 1-2 x daily - 5 x weekly - 2 sets - 10 reps - Seated Heel Raise  - 1-2  x daily - 5 x weekly - 2 sets - 10 reps - Seated Toe Raise  - 1-2 x daily - 5 x weekly - 2 sets - 10 reps - Standing Hip Extension with Counter Support  - 2 x daily - 7 x weekly - 1 sets - 10 reps - Standing Hip Abduction with Counter Support  - 2 x daily - 7 x weekly - 1 sets - 10 reps - Standing March with Counter Support  - 2 x daily - 7 x weekly - 10 reps    PATIENT EDUCATION: Education details: above bolded exercises added to initial HEP Medbridge  [email or web address] Person educated: Patient Education method: Medical Illustrator Education comprehension: verbalized understanding, returned demonstration, and needs further education  HOME EXERCISE PROGRAM:  Access Code: [email or web address] URL: [email or web address] Date: 07/26/2024 Prepared by: [place] Senate Exercises  - Seated March  - 1-2 x daily - 5 x weekly - 2 sets - 10 reps  - Seated Heel Raise  - 1-2 x daily - 5 x weekly - 2 sets - 10 reps  - Seated Toe Raise  - 1-2 x daily - 5 x weekly - 2 sets - 10 reps   GOALS: Goals reviewed with patient? Yes  SHORT TERM GOALS: Target date: 08/11/2024  Pt will be independent with initial HEP for improved strength, balance, transfers and gait. Baseline: Goal status: INITIAL  2.  Pt will improve gait velocity to at least 1.75 ft/sec for improved gait efficiency and performance at CGA level  Baseline: 1.66 ft/sec with stand alone cane min A (10/23) Goal status: INITIAL  3.  Pt will improve 5 x STS to less than or equal to 25 seconds to demonstrate improved functional strength and transfer efficiency.  Baseline: 29.4 sec with no UE but posterior LOB and min A (10/23) Goal status: INITIAL  4.  Pt will improve normal TUG to less than or equal to 16 seconds for improved functional mobility and decreased fall risk. Baseline: 19.35 sec with stand alone cane and min A (10/23) Goal status: INITIAL  5.  Berg balance assessment will improve from 14/56 to at least 19/56 in order to demonstrate decreased risk of falls. Baseline: 14/56  Goal status: INITIAL    LONG TERM GOALS: Target date: 09/08/2024   Pt will be independent with final HEP for improved strength, balance, transfers and gait. Baseline:  Goal status: INITIAL  2.  Pt will improve gait velocity to at least 2.0 ft/sec for improved gait efficiency and performance at SBA level  Baseline: 1.66 ft/sec with stand alone cane min A (10/23) Goal status: INITIAL  3.  Pt will  improve 5 x STS to less than or equal to 21 seconds to demonstrate improved functional strength and transfer efficiency.  Baseline:  29.4 sec with no UE but posterior LOB and min A (10/23) Goal status: INITIAL  4.  Pt will improve normal TUG to less than or equal to 13 seconds for improved functional mobility and decreased fall risk. Baseline: 19.35 sec with stand alone cane and min A (10/23) Goal status: INITIAL  5.  Berg balance assessment will improve to at least 24/56 in order to demonstrate decreased risk of falls. Baseline: 14/56 Goal status: INITIAL    ASSESSMENT:  CLINICAL IMPRESSION: PT session focused on bil. LE strengthening and balance training.  Pt has much difficulty maintaining balance with sit to stand transfer without UE support upon initial standing; pt continues to lose balance posteriorly but with UE support  she is able to recover and weight shift anteriorly preventing LOB.  Pt is unable to plantarflex RLE or LLE in standing resulting in no push off in stance phase of gait and unable to shift weight anteriorly on rockerboard with knees extended.  Pt also has much difficulty standing statically without UE support.  Cont with POC.    OBJECTIVE IMPAIRMENTS: Abnormal gait, decreased balance, decreased cognition, decreased coordination, difficulty walking, decreased ROM, decreased strength, impaired perceived functional ability, impaired sensation, improper body mechanics, postural dysfunction, and pain.   ACTIVITY LIMITATIONS: carrying, lifting, bending, standing, stairs, transfers, bed mobility, and locomotion level  PARTICIPATION LIMITATIONS: meal prep, cleaning, laundry, driving, community activity, and yard work  PERSONAL FACTORS: Age, Time since onset of injury/illness/exacerbation, and 3+ comorbidities:   PMH: HTN, Hypothyroidism, arthritis,spinal stenosis, L foot drop, neuropathyare also affecting patient's functional outcome.   REHAB POTENTIAL: Fair has tried PT in  the past, buy-in regarding AD recommendations, age, cervical spinal stenosis, neuropathy, foot drop  CLINICAL DECISION MAKING: Unstable/unpredictable  EVALUATION COMPLEXITY: High  PLAN:  PT FREQUENCY: 2x/week  PT DURATION: 8 weeks  PLANNED INTERVENTIONS: 97164- PT Re-evaluation, 97750- Physical Performance Testing, 97110-Therapeutic exercises, 97530- Therapeutic activity, [id number]- Neuromuscular re-education, 97535- Self Care, 02859- Manual therapy, [id number]- Gait training, [phone]- Orthotic Initial, [phone]- Orthotic/Prosthetic subsequent, [id number]- Aquatic Therapy, [phone]- Electrical stimulation (manual), [phone] (1-2 muscles), [id number] (3+ muscles)- Dry Needling, Patient/Family education, Balance training, Stair training, Taping, Joint mobilization, Spinal mobilization, Cognitive remediation, DME instructions, Cryotherapy, and Moist heat  PLAN FOR NEXT SESSION:  check new exercises added to HEP for any questions/problems  assess B hip abd strength and address wasting of L glutes, work on LE placement and anterior weight shift with sit to stands, toe taps in standing, quad and gluteal strengthening in standing and seated/supine. Add to HEP accordingly (I.e. seated and supine until safe to perform standing at home).   Roxanna [place], PT  07/28/2024, 7:57 PM

## 2024-07-28 NOTE — Progress Notes (Signed)
   Established Patient Office Visit  Subjective   Patient ID: Carol Woods, female    DOB: 06-20-1934  Age: 89 y.o. MRN: [id number]  No chief complaint on file.   Discussed the use of AI scribe software for clinical note transcription with the patient, who gave verbal consent to proceed.  History of Present Illness Carol Woods is an 89 year old female with hypertension who presents for blood pressure management.   Hypertension management - Consistently elevated blood pressure readings 150s-160s/70s at home - Current antihypertensive regimen includes 5mg  amlodipine  at night and 20mg  lisinopril  in the morning - No dizziness, chest pain, or shortness of breath  Hypokalemia - Previous laboratory results show potassium levels 3.0 and 3.1. She was prescribed oral supplement but was concerned about large size of pill and possible side effects so she did not start the supplement. - She has been increasing dietary potassium intake with potatoes and bananas       ROS: see HPI    Objective:     BP (!) 152/64   Pulse 70   Temp 98.2 F (36.8 C) (Temporal)   Ht 5' 1 (1.549 m)   Wt 120 lb 4 oz (54.5 kg)   SpO2 97%   BMI 22.72 kg/m    Physical Exam Vitals reviewed.  Constitutional:      General: She is not in acute distress.    Appearance: Normal appearance.  HENT:     Head: Normocephalic and atraumatic.  Cardiovascular:     Rate and Rhythm: Normal rate and regular rhythm.     Pulses: Normal pulses.     Heart sounds: Normal heart sounds.  Pulmonary:     Effort: Pulmonary effort is normal.     Breath sounds: Normal breath sounds.  Skin:    General: Skin is warm and dry.  Neurological:     General: No focal deficit present.     Mental Status: She is alert and oriented to person, place, and time.  Psychiatric:        Mood and Affect: Mood normal.        Behavior: Behavior normal.        Judgment: Judgment normal.      No results found for any visits on  07/28/24.    The ASCVD Risk score (Arnett DK, et al., 2019) failed to calculate for the following reasons:   The 2019 ASCVD risk score is only valid for ages 54 to 51    Assessment & Plan:   Problem List Items Addressed This Visit       Cardiovascular and Mediastinum   Essential hypertension   Relevant Medications   amLODipine  (NORVASC ) 2.5 MG tablet   Other Relevant Orders   Basic metabolic panel with GFR   Assessment and Plan Assessment & Plan Essential hypertension Blood pressure suboptimally controlled.  - Increased amlodipine  to 7.5mg  at night. Coninue lisinopril  20mg /day in the morning - Instructed to monitor for dizziness, fatigue, chest pain, or shortness of breath. - Scheduled follow-up in one month to assess blood pressure control.  Hypokalemia Previous low potassium levels. Dietary potassium intake increased. Concerns about potassium supplementation due to bowel issues and pill size. Discussed alternative administration methods. - Ordered blood test to check current potassium levels. - If potassium levels are improving, continue dietary potassium intake.     Return in about 1 month (around 08/27/2024) for HTN, F/U with Lauraine.    Lauraine FORBES Pereyra, NP

## 2024-07-28 NOTE — Assessment & Plan Note (Signed)
 Essential hypertension Blood pressure suboptimally controlled.  - Increased amlodipine  to 7.5mg  at night. Coninue lisinopril  20mg /day in the morning - Instructed to monitor for dizziness, fatigue, chest pain, or shortness of breath. - Scheduled follow-up in one month to assess blood pressure control.

## 2024-07-29 NOTE — Telephone Encounter (Unsigned)
 Copied from CRM #[id number]. Topic: Clinical - Lab/Test Results >> Jul 29, 2024 10:13 AM Franky GRADE wrote: Reason for CRM: Patient is returning a call from [place] to go over lab results, I read the message left by Lauraine Pereyra and patient understood and had no questions. She will attempt to start the supplements today and will call back if she is having difficulty with taking the pill.

## 2024-08-02 ENCOUNTER — Ambulatory Visit: Admitting: Physical Therapy

## 2024-08-02 DIAGNOSIS — R2689 Other abnormalities of gait and mobility: Secondary | ICD-10-CM

## 2024-08-02 DIAGNOSIS — R2681 Unsteadiness on feet: Secondary | ICD-10-CM

## 2024-08-02 DIAGNOSIS — M6281 Muscle weakness (generalized): Secondary | ICD-10-CM | POA: Diagnosis not present

## 2024-08-02 NOTE — Therapy (Signed)
 OUTPATIENT PHYSICAL THERAPY NEURO TREATMENT   Patient Name: Carol Woods MRN: [id number] DOB:1933/12/12, 89 y.o., female Today's Date: 08/02/2024   PCP: Elnor Lauraine FORBES, NP REFERRING PROVIDER: Claudene Arthea HERO, DO  END OF SESSION:  PT End of Session - 08/02/24 1149     Visit Number 4    Number of Visits 17   with eval   Date for Recertification  09/22/24    Authorization Type [employer]    PT Start Time 1148   pt arrived late   PT Stop Time 1230    PT Time Calculation (min) 42 min    Equipment Utilized During Treatment Gait belt    Activity Tolerance Patient tolerated treatment well    Behavior During Therapy WFL for tasks assessed/performed             Past Medical History:  Diagnosis Date   Arthritis    back; shoulders (08/31/2014)   Atypical chest pain 11/21/2019   Essential hypertension 07/13/2018   GERD (gastroesophageal reflux disease)    OTC, changed diet   High cholesterol    History of cataract surgery 02/05/2015   2nd eye done 07/23/2015   Hypertension    Hypothyroidism    Past Surgical History:  Procedure Laterality Date   ANTERIOR CERVICAL DECOMP/DISCECTOMY FUSION  02/2010   Dr Colon   BACK SURGERY     COLONOSCOPY W/ POLYPECTOMY     DILATION AND CURETTAGE OF UTERUS     POSTERIOR LUMBAR FUSION  04/2010   Dr Colon   TONSILLECTOMY     age 53   TOTAL SHOULDER ARTHROPLASTY Right 08/31/2014   TOTAL SHOULDER ARTHROPLASTY Right 08/31/2014   Procedure: TOTAL SHOULDER ARTHROPLASTY;  Surgeon: Eva Elsie Herring, MD;  Location: [facility];  Service: Orthopedics;  Laterality: Right;  Right total shoulder arthroplasty   Patient Active Problem List   Diagnosis Date Noted   Osteoporosis 07/15/2024   Degenerative arthritis of left knee 03/17/2024   Dry skin 03/17/2024   COVID-19 04/23/2023   Spinal stenosis in cervical region 04/15/2023   Diarrhea 03/06/2023   Falls 03/06/2023   Fatigue 03/06/2023   Forgetfulness 03/06/2023   Light headedness  03/06/2023   Mass of hard palate 03/06/2023   Hypokalemia 03/06/2023   Hypothyroidism 03/06/2023   Hyperlipidemia 03/06/2023   Hypocalcemia 03/06/2023   Contusion of chest 02/05/2023   Left foot drop 10/31/2022   Pill dysphagia 07/11/2022   Change in bowel habits 07/11/2022   History of colonic polyps 07/11/2022   GERD (gastroesophageal reflux disease) 11/21/2019   Atypical chest pain 11/21/2019   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   Essential hypertension 07/13/2018   Lumbar spondylosis 06/10/2018   Thoracic spinal stenosis 03/18/2018   Spinal stenosis of lumbar region 03/18/2018   Gait abnormality 02/04/2018   Paresthesia 02/04/2018   MGUS (monoclonal gammopathy of unknown significance) 12/02/2017   Status post total shoulder arthroplasty 08/31/2014    ONSET DATE: 06/13/2024 (referral date)  REFERRING DIAG: R26.9 (ICD-10-CM) - Gait abnormality R26.89 (ICD-10-CM) - Balance problem  THERAPY DIAG:  Unsteadiness on feet  Other abnormalities of gait and mobility  Muscle weakness (generalized)  Rationale for Evaluation and Treatment: Rehabilitation  SUBJECTIVE:  SUBJECTIVE STATEMENT:  Pt reports no real falls since last visit, she did lift a plant while squatting and it was heavier than she though so she ended up sitting on the ground, no injuries. Pt did start a new BP medication a few days ago.  Pt accompanied by: self  PERTINENT HISTORY: PMH: HTN, Hypothyroidism, arthritis,spinal stenosis, L foot drop, neuropathy  PAIN:  Are you having pain? Yes: NPRS scale: not rated Pain location: chronic pain - pain in shoulders, arms, lower back all sometimes Pain description: not described Aggravating factors: not described Relieving factors: not  described  PRECAUTIONS: Fall  RED FLAGS: None   WEIGHT BEARING RESTRICTIONS: No  FALLS: Has patient fallen in last 6 months? Yes. Number of falls see subjective from eval  LIVING ENVIRONMENT: Lives with: lives alone Lives in: House/apartment Stairs: Yes: External: 4 steps; on right going up Has following equipment at home: stand alone cane  PLOF: Independent with gait, Independent with transfers, and Requires assistive device for independence  PATIENT GOALS: be able to walk better, toss my cane, but not sure that will ever happen stay out of a wheelchair or walker - does use walker occasionally - in the yard I would like to not have surgery - Dr. Claudene thinks she needs to have it  OBJECTIVE:  Note: Objective measures were completed at Evaluation unless otherwise noted.  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  COGNITION: Overall cognitive status: reports memory impairments for about a year   SENSATION: Neuropathy in BLE (L>R)  COORDINATION: Impaired in BLE  EDEMA:  Feet and ankles get swollen sometimes   POSTURE: rounded shoulders, forward head, increased thoracic kyphosis, and posterior pelvic tilt  LOWER EXTREMITY ROM:     Active  Right Eval Left Eval  Hip flexion    Hip extension    Hip abduction    Hip adduction    Hip internal rotation    Hip external rotation    Knee flexion    Knee extension    Ankle dorsiflexion Tight gastroc Tight gastroc  Ankle plantarflexion    Ankle inversion    Ankle eversion     (Blank rows = not tested)  LOWER EXTREMITY MMT:    MMT Right Eval Left Eval  Hip flexion 5 5  Hip extension    Hip abduction    Hip adduction    Hip internal rotation    Hip external rotation    Knee flexion 3 3  Knee extension 4 4  Ankle dorsiflexion 3- 2-  Ankle plantarflexion    Ankle inversion     Ankle eversion    (Blank rows = not tested)  BED MOBILITY:  Mod I via logroll per patient report  TRANSFERS: Sit to stand: SBA  Assistive device utilized: None     Stand to sit: SBA  Assistive device utilized: None     Chair to chair: SBA  Assistive device utilized: None       RAMP:  Not tested  CURB:  Not tested  STAIRS: Not tested GAIT: Findings:  Gait pattern: decreased hip/knee flexion- Left, decreased ankle dorsiflexion- Right, decreased ankle dorsiflexion- Left, poor foot clearance- Right, and poor foot clearance- Left Distance walked: various clinic distances Assistive device utilized: stand alone cane Level of assistance: Min A Comments: to be further assessed   FUNCTIONAL TESTS:  07/26/24 0001  Standardized Balance Assessment  Standardized Balance Assessment Berg Balance Test  Berg Balance Test  Sit to Stand 3 (doesn't use hands but very unstead and often needs time to stabilize or falls backwards but doesn't use assistance)  Standing Unsupported 0 (Only able to stand once patient orients themselves or uses chair ot balance)  Sitting with Back Unsupported but Feet Supported on Floor or Stool 4  Stand to Sit 1  Transfers 2  Standing Unsupported with Eyes Closed 0 (Only able to stand once patient orients themselves or uses chair ot balance)  Standing Unsupported with Feet Together 1 (Only able to stand once patient orients themselves or uses chair ot balance)  From Standing, Reach Forward with Outstretched Arm 0 (Must hold on to chair)  From Standing Position, Pick up Object from Floor 0  From Standing Position, Turn to Look Behind Over each Shoulder 0 (Must hold on to chair)  Turn 360 Degrees 1  Standing Unsupported, Alternately Place Feet on Step/Stool 1  Standing Unsupported, One Foot in Front 1  Standing on One Leg 0  Total Score 14  Berg comment: High risk for falls                                                                                                                                   TREATMENT DATE:  08-02-24   TherAct: To work on B hip abd strengthening: Supine bridges x 10 reps with 5 sec hold Supine SL bridges x 10 reps B Sidelying clams x 10 reps B, easy Added in green theraband x 10 reps LLE only   To work on anterior weight shift with sit to stand: Sit to stand onto blue wedge 2 x 10 reps with min A Cues for glute activation, use of ankle strategy, quad activation Decreased bracing of knees against mat table Eccentric control when sitting    PATIENT EDUCATION: Education details: continue HEP and added to HEP Person educated: Patient Education method: Programmer, Multimedia, Demonstration, Actor cues, Verbal cues, and Handouts Education comprehension: verbalized understanding, returned demonstration, and needs further education  HOME EXERCISE PROGRAM:  Access Code: [email or web address] URL: [email or web address] Date: 07/28/2024 Prepared by: Rock Kussmaul  Exercises - bolded exercises added to previous HEP - Seated March  - 1-2 x daily - 5 x weekly - 2 sets - 10 reps - Seated Heel Raise  - 1-2 x daily - 5 x weekly - 2 sets - 10 reps - Seated Toe Raise  - 1-2 x daily - 5 x weekly - 2 sets - 10 reps - Standing Hip Extension with Counter Support  - 2 x daily - 7 x weekly - 1 sets - 10 reps - Standing Hip Abduction with Counter Support  - 2 x daily - 7 x weekly - 1 sets - 10 reps - Standing March with Counter Support  - 2 x daily - 7 x weekly - 10 reps -  Single Leg Bridge  - 1 x daily - 7 x weekly - 3 sets - 10 reps - Clam with Resistance  - 1 x daily - 7 x weekly - 3 sets - 10 reps  GOALS: Goals reviewed with patient? Yes  SHORT TERM GOALS: Target date: 08/11/2024  Pt will be independent with initial HEP for improved strength, balance, transfers and gait. Baseline: Goal status: INITIAL  2.  Pt will improve gait velocity to at least 1.75 ft/sec for improved gait efficiency and performance at CGA  level  Baseline: 1.66 ft/sec with stand alone cane min A (10/23) Goal status: INITIAL  3.  Pt will improve 5 x STS to less than or equal to 25 seconds to demonstrate improved functional strength and transfer efficiency.  Baseline: 29.4 sec with no UE but posterior LOB and min A (10/23) Goal status: INITIAL  4.  Pt will improve normal TUG to less than or equal to 16 seconds for improved functional mobility and decreased fall risk. Baseline: 19.35 sec with stand alone cane and min A (10/23) Goal status: INITIAL  5.  Berg balance assessment will improve from 14/56 to at least 19/56 in order to demonstrate decreased risk of falls. Baseline: 14/56  Goal status: INITIAL    LONG TERM GOALS: Target date: 09/08/2024   Pt will be independent with final HEP for improved strength, balance, transfers and gait. Baseline:  Goal status: INITIAL  2.  Pt will improve gait velocity to at least 2.0 ft/sec for improved gait efficiency and performance at SBA level  Baseline: 1.66 ft/sec with stand alone cane min A (10/23) Goal status: INITIAL  3.  Pt will improve 5 x STS to less than or equal to 21 seconds to demonstrate improved functional strength and transfer efficiency.  Baseline:  29.4 sec with no UE but posterior LOB and min A (10/23) Goal status: INITIAL  4.  Pt will improve normal TUG to less than or equal to 13 seconds for improved functional mobility and decreased fall risk. Baseline: 19.35 sec with stand alone cane and min A (10/23) Goal status: INITIAL  5.  Berg balance assessment will improve to at least 24/56 in order to demonstrate decreased risk of falls. Baseline: 14/56 Goal status: INITIAL    ASSESSMENT:  CLINICAL IMPRESSION: Emphasis of skilled PT session on trialing various hip abd strengthening exercises in order to work on glute strengthening and add to her HEP. Remainder of session worked on sit to stands and anterior weight shift. Pt can benefit from further practice  on anterior weight shift with sit to stands as well as further glute strengthening (L>R) and dynamic balance to work on reducing her fall risk. Continue POC.    OBJECTIVE IMPAIRMENTS: Abnormal gait, decreased balance, decreased cognition, decreased coordination, difficulty walking, decreased ROM, decreased strength, impaired perceived functional ability, impaired sensation, improper body mechanics, postural dysfunction, and pain.   ACTIVITY LIMITATIONS: carrying, lifting, bending, standing, stairs, transfers, bed mobility, and locomotion level  PARTICIPATION LIMITATIONS: meal prep, cleaning, laundry, driving, community activity, and yard work  PERSONAL FACTORS: Age, Time since onset of injury/illness/exacerbation, and 3+ comorbidities:   PMH: HTN, Hypothyroidism, arthritis,spinal stenosis, L foot drop, neuropathyare also affecting patient's functional outcome.   REHAB POTENTIAL: Fair has tried PT in the past, buy-in regarding AD recommendations, age, cervical spinal stenosis, neuropathy, foot drop  CLINICAL DECISION MAKING: Unstable/unpredictable  EVALUATION COMPLEXITY: High  PLAN:  PT FREQUENCY: 2x/week  PT DURATION: 8 weeks  PLANNED INTERVENTIONS: 02835- PT Re-evaluation,  02249- Physical Performance Testing, 97110-Therapeutic exercises, 97530- Therapeutic activity, [id number]- Neuromuscular re-education, [phone]- Self Care, 02859- Manual therapy, [phone]- Gait training, [phone]- Orthotic Initial, [phone]- Orthotic/Prosthetic subsequent, [phone]- Aquatic Therapy, [phone]- Electrical stimulation (manual), [phone] (1-2 muscles), [phone] (3+ muscles)- Dry Needling, Patient/Family education, Balance training, Stair training, Taping, Joint mobilization, Spinal mobilization, Cognitive remediation, DME instructions, Cryotherapy, and Moist heat  PLAN FOR NEXT SESSION:  check new exercises added to HEP for any questions/problems  assess B hip abd strength and address wasting of L glutes, work on LE placement and  anterior weight shift with sit to stands, toe taps in standing, quad and gluteal strengthening in standing and seated/supine. Add to HEP accordingly (I.e. seated and supine until safe to perform standing at home)., SLS   Yurem Viner, PT [place], PT, DPT, CSRS   08/02/2024, 12:32 PM

## 2024-08-04 ENCOUNTER — Encounter: Payer: Self-pay | Admitting: Physical Therapy

## 2024-08-04 ENCOUNTER — Ambulatory Visit: Admitting: Physical Therapy

## 2024-08-04 VITALS — BP 137/57 | HR 73

## 2024-08-04 DIAGNOSIS — R2689 Other abnormalities of gait and mobility: Secondary | ICD-10-CM

## 2024-08-04 DIAGNOSIS — R2681 Unsteadiness on feet: Secondary | ICD-10-CM | POA: Diagnosis not present

## 2024-08-04 DIAGNOSIS — M6281 Muscle weakness (generalized): Secondary | ICD-10-CM | POA: Diagnosis not present

## 2024-08-04 NOTE — Therapy (Signed)
 OUTPATIENT PHYSICAL THERAPY NEURO TREATMENT   Patient Name: Carol Woods MRN: [id number] DOB:02-28-1934, 89 y.o., female Today's Date: 08/05/2024   PCP: Elnor Lauraine FORBES, NP REFERRING PROVIDER: Claudene Arthea HERO, DO  END OF SESSION:  PT End of Session - 08/04/24 1103     Visit Number 5    Number of Visits 17   with eval   Date for Recertification  09/22/24    Authorization Type [employer]    PT Start Time 1101    PT Stop Time 1145    PT Time Calculation (min) 44 min    Equipment Utilized During Treatment Gait belt    Activity Tolerance Patient tolerated treatment well    Behavior During Therapy WFL for tasks assessed/performed             Past Medical History:  Diagnosis Date   Arthritis    back; shoulders (08/31/2014)   Atypical chest pain 11/21/2019   Essential hypertension 07/13/2018   GERD (gastroesophageal reflux disease)    OTC, changed diet   High cholesterol    History of cataract surgery 02/05/2015   2nd eye done 07/23/2015   Hypertension    Hypothyroidism    Past Surgical History:  Procedure Laterality Date   ANTERIOR CERVICAL DECOMP/DISCECTOMY FUSION  02/2010   Dr Colon   BACK SURGERY     COLONOSCOPY W/ POLYPECTOMY     DILATION AND CURETTAGE OF UTERUS     POSTERIOR LUMBAR FUSION  04/2010   Dr Colon   TONSILLECTOMY     age 89   TOTAL SHOULDER ARTHROPLASTY Right 08/31/2014   TOTAL SHOULDER ARTHROPLASTY Right 08/31/2014   Procedure: TOTAL SHOULDER ARTHROPLASTY;  Surgeon: Eva Elsie Herring, MD;  Location: [facility] OR;  Service: Orthopedics;  Laterality: Right;  Right total shoulder arthroplasty   Patient Active Problem List   Diagnosis Date Noted   Osteoporosis 07/15/2024   Degenerative arthritis of left knee 03/17/2024   Dry skin 03/17/2024   COVID-19 04/23/2023   Spinal stenosis in cervical region 04/15/2023   Diarrhea 03/06/2023   Falls 03/06/2023   Fatigue 03/06/2023   Forgetfulness 03/06/2023   Light headedness 03/06/2023    Mass of hard palate 03/06/2023   Hypokalemia 03/06/2023   Hypothyroidism 03/06/2023   Hyperlipidemia 03/06/2023   Hypocalcemia 03/06/2023   Contusion of chest 02/05/2023   Left foot drop 10/31/2022   Pill dysphagia 07/11/2022   Change in bowel habits 07/11/2022   History of colonic polyps 07/11/2022   GERD (gastroesophageal reflux disease) 11/21/2019   Atypical chest pain 11/21/2019   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   Essential hypertension 07/13/2018   Lumbar spondylosis 06/10/2018   Thoracic spinal stenosis 03/18/2018   Spinal stenosis of lumbar region 03/18/2018   Gait abnormality 02/04/2018   Paresthesia 02/04/2018   MGUS (monoclonal gammopathy of unknown significance) 12/02/2017   Status post total shoulder arthroplasty 08/31/2014    ONSET DATE: 06/13/2024 (referral date)  REFERRING DIAG: R26.9 (ICD-10-CM) - Gait abnormality R26.89 (ICD-10-CM) - Balance problem  THERAPY DIAG:  Unsteadiness on feet  Other abnormalities of gait and mobility  Muscle weakness (generalized)  Rationale for Evaluation and Treatment: Rehabilitation  SUBJECTIVE:  SUBJECTIVE STATEMENT:  No changes, no falls. Haven't tried her exercises yesterday. Feels good about new exercises for strengthening. Had a near fall yesterday when trying to use a leaf blower (PT advised strongly against this due to significant fall risk) was not using any AD, having to hold onto the car   Pt accompanied by: self  PERTINENT HISTORY: PMH: HTN, Hypothyroidism, arthritis,spinal stenosis, L foot drop, neuropathy  PAIN:  Are you having pain? No pt denies pain today   PRECAUTIONS: Fall  RED FLAGS: None   WEIGHT BEARING RESTRICTIONS: No  FALLS: Has patient fallen in last 6 months? Yes.  Number of falls see subjective from eval  LIVING ENVIRONMENT: Lives with: lives alone Lives in: House/apartment Stairs: Yes: External: 4 steps; on right going up Has following equipment at home: stand alone cane  PLOF: Independent with gait, Independent with transfers, and Requires assistive device for independence  PATIENT GOALS: be able to walk better, toss my cane, but not sure that will ever happen stay out of a wheelchair or walker - does use walker occasionally - in the yard I would like to not have surgery - Dr. Claudene thinks she needs to have it  OBJECTIVE:  Note: Objective measures were completed at Evaluation unless otherwise noted.  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  COGNITION: Overall cognitive status: reports memory impairments for about a year   SENSATION: Neuropathy in BLE (L>R)  COORDINATION: Impaired in BLE  EDEMA:  Feet and ankles get swollen sometimes   POSTURE: rounded shoulders, forward head, increased thoracic kyphosis, and posterior pelvic tilt  LOWER EXTREMITY ROM:     Active  Right Eval Left Eval  Hip flexion    Hip extension    Hip abduction    Hip adduction    Hip internal rotation    Hip external rotation    Knee flexion    Knee extension    Ankle dorsiflexion Tight gastroc Tight gastroc  Ankle plantarflexion    Ankle inversion    Ankle eversion     (Blank rows = not tested)  LOWER EXTREMITY MMT:    MMT Right Eval Left Eval  Hip flexion 5 5  Hip extension    Hip abduction    Hip adduction    Hip internal rotation    Hip external rotation    Knee flexion 3 3  Knee extension 4 4  Ankle dorsiflexion 3- 2-  Ankle plantarflexion    Ankle inversion    Ankle eversion    (Blank rows = not tested)  BED MOBILITY:  Mod I via logroll per patient report  TRANSFERS: Sit to stand: SBA   Assistive device utilized: None     Stand to sit: SBA  Assistive device utilized: None     Chair to chair: SBA  Assistive device utilized: None       RAMP:  Not tested  CURB:  Not tested  STAIRS: Not tested GAIT: Findings:  Gait pattern: decreased hip/knee flexion- Left, decreased ankle dorsiflexion- Right, decreased ankle dorsiflexion- Left, poor foot clearance- Right, and poor foot clearance- Left Distance walked: various clinic distances Assistive device utilized: stand alone cane Level of assistance: Min A Comments: to be further assessed   FUNCTIONAL TESTS:     07/26/24 0001  Standardized Balance Assessment  Standardized Balance Assessment Berg Balance Test  [facility] Balance Test  Sit to Stand 3 (doesn't use hands but very unstead and often needs time to stabilize or falls backwards but doesn't use assistance)  Standing Unsupported 0 (Only able to stand once patient orients themselves or uses chair ot balance)  Sitting with Back Unsupported but Feet Supported on Floor or Stool 4  Stand to Sit 1  Transfers 2  Standing Unsupported with Eyes Closed 0 (Only able to stand once patient orients themselves or uses chair ot balance)  Standing Unsupported with Feet Together 1 (Only able to stand once patient orients themselves or uses chair ot balance)  From Standing, Reach Forward with Outstretched Arm 0 (Must hold on to chair)  From Standing Position, Pick up Object from Floor 0  From Standing Position, Turn to Look Behind Over each Shoulder 0 (Must hold on to chair)  Turn 360 Degrees 1  Standing Unsupported, Alternately Place Feet on Step/Stool 1  Standing Unsupported, One Foot in Front 1  Standing on One Leg 0  Total Score 14  Berg comment: High risk for falls                                                                                                                                  TREATMENT DATE:  08/04/24   TherAct: Vitals:   08/04/24 1110  BP: (!)  137/57  Pulse: 73   With 6 blaze pods on table in front of pt to work on forward reaching outside of BOS/anterior weight shift, balance with decr UE support, ankle strategy, performed on random hit setting, cued throughout for trying to alternate between hands when tapping, CGA/min A for balance  Performed 2 bouts of 1 minute: 21 hits, 32 hits (pt unable to keep feet still, takes multiple steps even when cued to just try to reach forwards and keep feet still) Pt reporting RPE as 9.5/10 Progressed by adding in a sit <> stand between each tap, performed 2 bouts of 1 minute: 12 hits, 8 hits With sit <> stands, needs cues throughout for anterior lean and tucking feet back to prevent BLE bracing against mat table   Working on sit <> stands from elevated mat table 10 reps (plus additional reps during session): Cues to tuck feet back and forward lean, and trying to keep balance in standing and then trying to sit down as slow as possible for eccentric control. Pt challenged by this and when initially in standing, pt appears to bounce and bend BLE for balance   In // bars: Lateral stepping over smaller orange obstacle and then back to midline, 15 reps each side, beginning with UE support and progressing to fingertip support  Forward step ups to 4 step, 2 x 10 reps each leg with BUE support, 2nd set trying to use less UE support (with fingertips or pt able to let go for a couple seconds), pt with decr eccentric control with LLE  CGA needed for balance  PATIENT EDUCATION: Education details: continue HEP, continued to educate on use of AD at all times due to significant fall risk (use of RW would be best, but pt does not want to use RW) and using her cane in the house as pt is currently furniture walking  Person educated: Patient Education method: Explanation, Demonstration, Tactile cues, and Verbal cues Education comprehension: verbalized understanding, returned demonstration, and needs further  education  HOME EXERCISE PROGRAM:  Access Code: [email or web address] URL: [email or web address] Date: 07/28/2024 Prepared by: Rock Kussmaul  Exercises - bolded exercises added to previous HEP - Seated March  - 1-2 x daily - 5 x weekly - 2 sets - 10 reps - Seated Heel Raise  - 1-2 x daily - 5 x weekly - 2 sets - 10 reps - Seated Toe Raise  - 1-2 x daily - 5 x weekly - 2 sets - 10 reps - Standing Hip Extension with Counter Support  - 2 x daily - 7 x weekly - 1 sets - 10 reps - Standing Hip Abduction with Counter Support  - 2 x daily - 7 x weekly - 1 sets - 10 reps - Standing March with Counter Support  - 2 x daily - 7 x weekly - 10 reps - Single Leg Bridge  - 1 x daily - 7 x weekly - 3 sets - 10 reps - Clam with Resistance  - 1 x daily - 7 x weekly - 3 sets - 10 reps  GOALS: Goals reviewed with patient? Yes  SHORT TERM GOALS: Target date: 08/11/2024  Pt will be independent with initial HEP for improved strength, balance, transfers and gait. Baseline: Goal status: INITIAL  2.  Pt will improve gait velocity to at least 1.75 ft/sec for improved gait efficiency and performance at CGA level  Baseline: 1.66 ft/sec with stand alone cane min A (10/23) Goal status: INITIAL  3.  Pt will improve 5 x STS to less than or equal to 25 seconds to demonstrate improved functional strength and transfer efficiency.  Baseline: 29.4 sec with no UE but posterior LOB and min A (10/23) Goal status: INITIAL  4.  Pt will improve normal TUG to less than or equal to 16 seconds for improved functional mobility and decreased fall risk. Baseline: 19.35 sec with stand alone cane and min A (10/23) Goal status: INITIAL  5.  Berg balance assessment will improve from 14/56 to at least 19/56 in order to demonstrate decreased risk of falls. Baseline: 14/56  Goal status: INITIAL    LONG TERM GOALS: Target date: 09/08/2024   Pt will be independent with final HEP for improved strength, balance, transfers and  gait. Baseline:  Goal status: INITIAL  2.  Pt will improve gait velocity to at least 2.0 ft/sec for improved gait efficiency and performance at SBA level  Baseline: 1.66 ft/sec with stand alone cane min A (10/23) Goal status: INITIAL  3.  Pt will improve 5 x STS to less than or equal to 21 seconds to demonstrate improved functional strength and transfer efficiency.  Baseline:  29.4 sec with no UE but posterior LOB and min A (10/23) Goal status: INITIAL  4.  Pt will improve normal TUG to less than or equal to 13 seconds for improved functional mobility and decreased fall risk. Baseline: 19.35 sec with stand alone cane and min A (10/23) Goal status: INITIAL  5.  Berg balance assessment will improve to at least 24/56 in order to demonstrate decreased risk of falls. Baseline: 14/56  Goal status: INITIAL    ASSESSMENT:  CLINICAL IMPRESSION: Today's skilled session continued to work on functional BLE strengthening with step ups/obstacles, and sit <> stands/anterior weight shifting. When working on anterior weight shifting and reaching, pt unable to keep feet still when performing and has tendency to take multiple little steps. With sit <> stands, pt needs cues to tuck feet back and lean forwards for anterior weight shifting to prevent BLE bracing against mat table. Once in standing, pt needs to bend knees to try to keep balance. Continued to educate pt to use RW to decr fall risk (pt does not want to use walker) or otherwise at least use her cane at all times (is furniture walking in the house). Will continue per POC.   OBJECTIVE IMPAIRMENTS: Abnormal gait, decreased balance, decreased cognition, decreased coordination, difficulty walking, decreased ROM, decreased strength, impaired perceived functional ability, impaired sensation, improper body mechanics, postural dysfunction, and pain.   ACTIVITY LIMITATIONS: carrying, lifting, bending, standing, stairs, transfers, bed mobility, and  locomotion level  PARTICIPATION LIMITATIONS: meal prep, cleaning, laundry, driving, community activity, and yard work  PERSONAL FACTORS: Age, Time since onset of injury/illness/exacerbation, and 3+ comorbidities:   PMH: HTN, Hypothyroidism, arthritis,spinal stenosis, L foot drop, neuropathyare also affecting patient's functional outcome.   REHAB POTENTIAL: Fair has tried PT in the past, buy-in regarding AD recommendations, age, cervical spinal stenosis, neuropathy, foot drop  CLINICAL DECISION MAKING: Unstable/unpredictable  EVALUATION COMPLEXITY: High  PLAN:  PT FREQUENCY: 2x/week  PT DURATION: 8 weeks  PLANNED INTERVENTIONS: 97164- PT Re-evaluation, 97750- Physical Performance Testing, 97110-Therapeutic exercises, 97530- Therapeutic activity, [id number]- Neuromuscular re-education, 97535- Self Care, [id number]- Manual therapy, [id number]- Gait training, [phone]- Orthotic Initial, [phone]- Orthotic/Prosthetic subsequent, [phone]- Aquatic Therapy, [phone]- Electrical stimulation (manual), [phone] (1-2 muscles), [id number] (3+ muscles)- Dry Needling, Patient/Family education, Balance training, Stair training, Taping, Joint mobilization, Spinal mobilization, Cognitive remediation, DME instructions, Cryotherapy, and Moist heat  PLAN FOR NEXT SESSION:  STGS due next week   work on LE placement and anterior weight shift with sit to stands, toe taps in standing, quad and gluteal strengthening in standing and seated/supine. Add to HEP accordingly (I.e. seated and supine until safe to perform standing at home), SLS  Alfard Cochrane, PT, DPT 08/05/24 8:35 AM

## 2024-08-09 ENCOUNTER — Encounter: Payer: Self-pay | Admitting: Physical Therapy

## 2024-08-09 ENCOUNTER — Ambulatory Visit: Admitting: Physical Therapy

## 2024-08-09 DIAGNOSIS — M6281 Muscle weakness (generalized): Secondary | ICD-10-CM

## 2024-08-09 DIAGNOSIS — R2689 Other abnormalities of gait and mobility: Secondary | ICD-10-CM

## 2024-08-09 DIAGNOSIS — R2681 Unsteadiness on feet: Secondary | ICD-10-CM

## 2024-08-09 NOTE — Therapy (Signed)
 OUTPATIENT PHYSICAL THERAPY NEURO TREATMENT   Patient Name: Carol Woods MRN: [id number] DOB:[birth year], 89 y.o., female Today's Date: 08/09/2024   PCP: Elnor Lauraine FORBES, NP REFERRING PROVIDER: Claudene Arthea HERO, DO  END OF SESSION:  PT End of Session - 08/09/24 1227     Visit Number 6    Number of Visits 17   with eval   Date for Recertification  09/22/24    Authorization Type [employer]    PT Start Time 1103    PT Stop Time 1150    PT Time Calculation (min) 47 min    Equipment Utilized During Treatment Gait belt    Activity Tolerance Patient tolerated treatment well    Behavior During Therapy WFL for tasks assessed/performed              Past Medical History:  Diagnosis Date   Arthritis    back; shoulders (08/31/2014)   Atypical chest pain 11/21/2019   Essential hypertension 07/13/2018   GERD (gastroesophageal reflux disease)    OTC, changed diet   High cholesterol    History of cataract surgery 02/05/2015   2nd eye done 07/23/2015   Hypertension    Hypothyroidism    Past Surgical History:  Procedure Laterality Date   ANTERIOR CERVICAL DECOMP/DISCECTOMY FUSION  02/2010   Dr Colon   BACK SURGERY     COLONOSCOPY W/ POLYPECTOMY     DILATION AND CURETTAGE OF UTERUS     POSTERIOR LUMBAR FUSION  04/2010   Dr Colon   TONSILLECTOMY     age 53   TOTAL SHOULDER ARTHROPLASTY Right 08/31/2014   TOTAL SHOULDER ARTHROPLASTY Right 08/31/2014   Procedure: TOTAL SHOULDER ARTHROPLASTY;  Surgeon: Eva Elsie Herring, MD;  Location: [facility] OR;  Service: Orthopedics;  Laterality: Right;  Right total shoulder arthroplasty   Patient Active Problem List   Diagnosis Date Noted   Osteoporosis 07/15/2024   Degenerative arthritis of left knee 03/17/2024   Dry skin 03/17/2024   COVID-19 04/23/2023   Spinal stenosis in cervical region 04/15/2023   Diarrhea 03/06/2023   Falls 03/06/2023   Fatigue 03/06/2023   Forgetfulness 03/06/2023   Light headedness 03/06/2023    Mass of hard palate 03/06/2023   Hypokalemia 03/06/2023   Hypothyroidism 03/06/2023   Hyperlipidemia 03/06/2023   Hypocalcemia 03/06/2023   Contusion of chest 02/05/2023   Left foot drop 10/31/2022   Pill dysphagia 07/11/2022   Change in bowel habits 07/11/2022   History of colonic polyps 07/11/2022   GERD (gastroesophageal reflux disease) 11/21/2019   Atypical chest pain 11/21/2019   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   Essential hypertension 07/13/2018   Lumbar spondylosis 06/10/2018   Thoracic spinal stenosis 03/18/2018   Spinal stenosis of lumbar region 03/18/2018   Gait abnormality 02/04/2018   Paresthesia 02/04/2018   MGUS (monoclonal gammopathy of unknown significance) 12/02/2017   Status post total shoulder arthroplasty 08/31/2014    ONSET DATE: 06/13/2024 (referral date)  REFERRING DIAG: R26.9 (ICD-10-CM) - Gait abnormality R26.89 (ICD-10-CM) - Balance problem  THERAPY DIAG:  Unsteadiness on feet  Other abnormalities of gait and mobility  Muscle weakness (generalized)  Rationale for Evaluation and Treatment: Rehabilitation  SUBJECTIVE:  SUBJECTIVE STATEMENT:  Pt reports she has done exercises some - not every day; went to [facility] on "Sunday to a concert and had to walk a long distance as there were no handicap spaces available; says her quad muscles are sore from doing the walking on Sunday    Pt accompanied by: self  PERTINENT HISTORY: PMH: HTN, Hypothyroidism, arthritis,spinal stenosis, L foot drop, neuropathy  PAIN:  Are you having pain? No pt denies pain today   PRECAUTIONS: Fall  RED FLAGS: None   WEIGHT BEARING RESTRICTIONS: No  FALLS: Has patient fallen in last 6 months? Yes. Number of falls see subjective from  eval  LIVING ENVIRONMENT: Lives with: lives alone Lives in: House/apartment Stairs: Yes: External: 4 steps; on right going up Has following equipment at home: stand alone cane  PLOF: Independent with gait, Independent with transfers, and Requires assistive device for independence  PATIENT GOALS: be able to walk better, toss my cane, but not sure that will ever happen stay out of a wheelchair or walker - does use walker occasionally - in the yard I would like to not have surgery - Dr. Smith thinks she needs to have it  OBJECTIVE:  Note: Objective measures were completed at Evaluation unless otherwise noted.  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  COGNITION: Overall cognitive status: reports memory impairments for about a year   SENSATION: Neuropathy in BLE (L>R)  COORDINATION: Impaired in BLE  EDEMA:  Feet and ankles get swollen sometimes   POSTURE: rounded shoulders, forward head, increased thoracic kyphosis, and posterior pelvic tilt  LOWER EXTREMITY ROM:     Active  Right Eval Left Eval  Hip flexion    Hip extension    Hip abduction    Hip adduction    Hip internal rotation    Hip external rotation    Knee flexion    Knee extension    Ankle dorsiflexion Tight gastroc Tight gastroc  Ankle plantarflexion    Ankle inversion    Ankle eversion     (Blank rows = not tested)  LOWER EXTREMITY MMT:    MMT Right Eval Left Eval  Hip flexion 5 5  Hip extension    Hip abduction    Hip adduction    Hip internal rotation    Hip external rotation    Knee flexion 3 3  Knee extension 4 4  Ankle dorsiflexion 3- 2-  Ankle plantarflexion    Ankle inversion    Ankle eversion    (Blank rows = not tested)  BED MOBILITY:  Mod I via logroll per patient report  TRANSFERS: Sit to stand: SBA  Assistive device utilized: None      Stand to sit: SBA  Assistive device utilized: None     Chair to chair: SBA  Assistive device utilized: None       RAMP:  Not tested  CURB:  Not tested  STAIRS: Not tested GAIT: Findings:  Gait pattern: decreased hip/knee flexion- Left, decreased ankle dorsiflexion- Right, decreased ankle dorsiflexion- Left, poor foot clearance- Right, and poor foot clearance- Left Distance walked: various clinic distances Assistive device utilized: stand alone cane Level of assistance: Min A Comments: to be further assessed   FUNCTIONAL TESTS:     11" /04/25 0001  Standardized Balance Assessment  Standardized Balance Assessment Berg Balance Test  Berg Balance Test  Sit to  Stand 3 (doesn't use hands but very unstead and often needs time to stabilize or falls backwards but doesn't use assistance)  Standing Unsupported 0 (Only able to stand once patient orients themselves or uses chair ot balance)  Sitting with Back Unsupported but Feet Supported on Floor or Stool 4  Stand to Sit 1  Transfers 2  Standing Unsupported with Eyes Closed 0 (Only able to stand once patient orients themselves or uses chair ot balance)  Standing Unsupported with Feet Together 1 (Only able to stand once patient orients themselves or uses chair ot balance)  From Standing, Reach Forward with Outstretched Arm 0 (Must hold on to chair)  From Standing Position, Pick up Object from Floor 0  From Standing Position, Turn to Look Behind Over each Shoulder 0 (Must hold on to chair)  Turn 360 Degrees 1  Standing Unsupported, Alternately Place Feet on Step/Stool 1  Standing Unsupported, One Foot in Front 1  Standing on One Leg 0  Total Score 14  Berg comment: High risk for falls                                                                                                                                  TREATMENT DATE:  08-09-24   TherAct: 5x sit to stand from mat without UE support - 23.03 secs - SBA - pt had  posterior LOB with 3/5 reps, sitting back down on mat table with LOB  TUG score =  18.31 secs with SPC  Alternate tap ups to 6 step 5 reps each LE:  10 reps each leg to 2nd step with UE support on rails  Rockerboard 10 reps x 2 sets inside // bars with bil. UE support- pt has to flex knees to shift weight anteriorly due to weak plantarflexors  3 balance bubbles arranged in 1/2 circle - pt touched each one individually 5 reps each leg with gradual decrease in UE support on // bars with SBA to CGA:  progressed to 3 reps each leg touching each one without UE support with min assist for recovery of LOB  Gait:   gait velocity = 15.78 secs = 2.08 ft/sec with use of SPC with SBA  Pt gait trained 115' without device with CGA on flat, even surface around track  TherEx: Step up exercise 10 reps RLE and 10 reps LLE onto 6 step with use of handrails   Leg press 40# bil. LE's 2 sets 10 reps    PATIENT EDUCATION: Education details: continue HEP, continued to educate on use of AD at all times due to significant fall risk (use of RW would be best, but pt does not want to use RW) and using her cane in the house as pt is currently furniture walking  Person educated: Patient Education method: Explanation, Demonstration, Tactile cues, and Verbal cues Education comprehension: verbalized understanding, returned demonstration, and needs further education  HOME EXERCISE PROGRAM:  Access  Code: [email or web address] URL: [email or web address] Date: 07/28/2024 Prepared by: Rock Kussmaul  Exercises - bolded exercises added to previous HEP - Seated March  - 1-2 x daily - 5 x weekly - 2 sets - 10 reps - Seated Heel Raise  - 1-2 x daily - 5 x weekly - 2 sets - 10 reps - Seated Toe Raise  - 1-2 x daily - 5 x weekly - 2 sets - 10 reps - Standing Hip Extension with Counter Support  - 2 x daily - 7 x weekly - 1 sets - 10 reps - Standing Hip Abduction with Counter Support  - 2 x daily - 7 x weekly - 1 sets -  10 reps - Standing March with Counter Support  - 2 x daily - 7 x weekly - 10 reps - Single Leg Bridge  - 1 x daily - 7 x weekly - 3 sets - 10 reps - Clam with Resistance  - 1 x daily - 7 x weekly - 3 sets - 10 reps   GOALS: Goals reviewed with patient? Yes  SHORT TERM GOALS: Target date: 08/11/2024  Pt will be independent with initial HEP for improved strength, balance, transfers and gait. Baseline: Goal status: Goal met - pt states she knows what she is supposed to do - 08-09-24  2.  Pt will improve gait velocity to at least 1.75 ft/sec for improved gait efficiency and performance at CGA level  Baseline: 1.66 ft/sec with stand alone cane min A (10/23); 15.78 secs = 2.08 ft/sec Goal status: Goal met 08-09-24  3.  Pt will improve 5 x STS to less than or equal to 25 seconds to demonstrate improved functional strength and transfer efficiency.  Baseline: 29.4 sec with no UE but posterior LOB and min A (10/23); 23.03 secs without UE support from mat - SBA Goal status: Goal met 08-09-24  4.  Pt will improve normal TUG to less than or equal to 16 seconds for improved functional mobility and decreased fall risk. Baseline: 19.35 sec with stand alone cane and min A (10/23); 18.31 secs with SPC - SBA  Goal status: Partially met 08-09-24  5.  Berg balance assessment will improve from 14/56 to at least 19/56 in order to demonstrate decreased risk of falls. Baseline: 14/56  Goal status: INITIAL    LONG TERM GOALS: Target date: 09/08/2024   Pt will be independent with final HEP for improved strength, balance, transfers and gait. Baseline:  Goal status: INITIAL  2.  Pt will improve gait velocity to at least 2.0 ft/sec for improved gait efficiency and performance at SBA level  Baseline: 1.66 ft/sec with stand alone cane min A (10/23) Goal status: INITIAL  3.  Pt will improve 5 x STS to less than or equal to 21 seconds to demonstrate improved functional strength and transfer efficiency.   Baseline:  29.4 sec with no UE but posterior LOB and min A (10/23) Goal status: INITIAL  4.  Pt will improve normal TUG to less than or equal to 13 seconds for improved functional mobility and decreased fall risk. Baseline: 19.35 sec with stand alone cane and min A (10/23) Goal status: INITIAL  5.  Berg balance assessment will improve to at least 24/56 in order to demonstrate decreased risk of falls. Baseline: 14/56 Goal status: INITIAL    ASSESSMENT:  CLINICAL IMPRESSION: PT session focused on STG assessment and bil. LE strengthening and SLS activities.  Pt has met STG's #1-3 and has partially met STG #4  as TUG score has improved 19.35 secs with SPC and min A to  18.31 secs with SPC with only SBA in today's session.  Pt is unable to perform sit to stand transfer and maintain static standing balance upon initial standing (has posterior LOB due to weak plantarflexors).  Pt has made some progress in improving mobility since initial eval.  Cont with POC.    OBJECTIVE IMPAIRMENTS: Abnormal gait, decreased balance, decreased cognition, decreased coordination, difficulty walking, decreased ROM, decreased strength, impaired perceived functional ability, impaired sensation, improper body mechanics, postural dysfunction, and pain.   ACTIVITY LIMITATIONS: carrying, lifting, bending, standing, stairs, transfers, bed mobility, and locomotion level  PARTICIPATION LIMITATIONS: meal prep, cleaning, laundry, driving, community activity, and yard work  PERSONAL FACTORS: Age, Time since onset of injury/illness/exacerbation, and 3+ comorbidities:   PMH: HTN, Hypothyroidism, arthritis,spinal stenosis, L foot drop, neuropathyare also affecting patient's functional outcome.   REHAB POTENTIAL: Fair has tried PT in the past, buy-in regarding AD recommendations, age, cervical spinal stenosis, neuropathy, foot drop  CLINICAL DECISION MAKING: Unstable/unpredictable  EVALUATION COMPLEXITY: High  PLAN:  PT  FREQUENCY: 2x/week  PT DURATION: 8 weeks  PLANNED INTERVENTIONS: 97164- PT Re-evaluation, 97750- Physical Performance Testing, 97110-Therapeutic exercises, 97530- Therapeutic activity, [id number]- Neuromuscular re-education, 97535- Self Care, [id number]- Manual therapy, [id number]- Gait training, [phone]- Orthotic Initial, [phone]- Orthotic/Prosthetic subsequent, [phone]- Aquatic Therapy, [phone]- Electrical stimulation (manual), [phone] (1-2 muscles), [id number] (3+ muscles)- Dry Needling, Patient/Family education, Balance training, Stair training, Taping, Joint mobilization, Spinal mobilization, Cognitive remediation, DME instructions, Cryotherapy, and Moist heat  PLAN FOR NEXT SESSION: check STG #5 Kirke) ; Leg press for quad strengthening  work on LE placement and anterior weight shift with sit to stands, toe taps in standing, quad and gluteal strengthening in standing and seated/supine. Add to HEP accordingly (I.e. seated and supine until safe to perform standing at home), SLS  Suzanne Brayley Mackowiak, PT 08/09/24 12:59 PM

## 2024-08-11 ENCOUNTER — Encounter: Payer: Self-pay | Admitting: Physical Therapy

## 2024-08-11 ENCOUNTER — Ambulatory Visit: Admitting: Physical Therapy

## 2024-08-11 DIAGNOSIS — R2681 Unsteadiness on feet: Secondary | ICD-10-CM | POA: Diagnosis not present

## 2024-08-11 DIAGNOSIS — M6281 Muscle weakness (generalized): Secondary | ICD-10-CM

## 2024-08-11 DIAGNOSIS — R2689 Other abnormalities of gait and mobility: Secondary | ICD-10-CM | POA: Diagnosis not present

## 2024-08-11 NOTE — Therapy (Signed)
 OUTPATIENT PHYSICAL THERAPY NEURO TREATMENT   Patient Name: Carol Woods MRN: [id number] DOB:[birth year], 89 y.o., female Today's Date: 08/11/2024   PCP: Elnor Lauraine FORBES, NP REFERRING PROVIDER: Claudene Arthea HERO, DO  END OF SESSION:  PT End of Session - 08/11/24 1018     Visit Number 7    Number of Visits 17   with eval   Date for Recertification  09/22/24    Authorization Type [employer]    PT Start Time 1017    PT Stop Time 1058    PT Time Calculation (min) 41 min    Equipment Utilized During Treatment Gait belt    Activity Tolerance Patient tolerated treatment well    Behavior During Therapy WFL for tasks assessed/performed              Past Medical History:  Diagnosis Date   Arthritis    back; shoulders (08/31/2014)   Atypical chest pain 11/21/2019   Essential hypertension 07/13/2018   GERD (gastroesophageal reflux disease)    OTC, changed diet   High cholesterol    History of cataract surgery 02/05/2015   2nd eye done 07/23/2015   Hypertension    Hypothyroidism    Past Surgical History:  Procedure Laterality Date   ANTERIOR CERVICAL DECOMP/DISCECTOMY FUSION  02/2010   Dr Colon   BACK SURGERY     COLONOSCOPY W/ POLYPECTOMY     DILATION AND CURETTAGE OF UTERUS     POSTERIOR LUMBAR FUSION  04/2010   Dr Colon   TONSILLECTOMY     age 35   TOTAL SHOULDER ARTHROPLASTY Right 08/31/2014   TOTAL SHOULDER ARTHROPLASTY Right 08/31/2014   Procedure: TOTAL SHOULDER ARTHROPLASTY;  Surgeon: Eva Elsie Herring, MD;  Location: [facility] OR;  Service: Orthopedics;  Laterality: Right;  Right total shoulder arthroplasty   Patient Active Problem List   Diagnosis Date Noted   Osteoporosis 07/15/2024   Degenerative arthritis of left knee 03/17/2024   Dry skin 03/17/2024   COVID-19 04/23/2023   Spinal stenosis in cervical region 04/15/2023   Diarrhea 03/06/2023   Falls 03/06/2023   Fatigue 03/06/2023   Forgetfulness 03/06/2023   Light headedness 03/06/2023    Mass of hard palate 03/06/2023   Hypokalemia 03/06/2023   Hypothyroidism 03/06/2023   Hyperlipidemia 03/06/2023   Hypocalcemia 03/06/2023   Contusion of chest 02/05/2023   Left foot drop 10/31/2022   Pill dysphagia 07/11/2022   Change in bowel habits 07/11/2022   History of colonic polyps 07/11/2022   GERD (gastroesophageal reflux disease) 11/21/2019   Atypical chest pain 11/21/2019   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   Essential hypertension 07/13/2018   Lumbar spondylosis 06/10/2018   Thoracic spinal stenosis 03/18/2018   Spinal stenosis of lumbar region 03/18/2018   Gait abnormality 02/04/2018   Paresthesia 02/04/2018   MGUS (monoclonal gammopathy of unknown significance) 12/02/2017   Status post total shoulder arthroplasty 08/31/2014    ONSET DATE: 06/13/2024 (referral date)  REFERRING DIAG: R26.9 (ICD-10-CM) - Gait abnormality R26.89 (ICD-10-CM) - Balance problem  THERAPY DIAG:  Unsteadiness on feet  Other abnormalities of gait and mobility  Muscle weakness (generalized)  Rationale for Evaluation and Treatment: Rehabilitation  SUBJECTIVE:  SUBJECTIVE STATEMENT:  Got rescued yesterday. Got on her knees with her cane to take a photo of her garden with her phone. Was trying to get up from this position to push up from her cane. [place] busy road and a woman stopped from driving to help her get up. Reports her neighbors have ordered a life alert button. Been trying to figure out what to do with the life alert.   Pt accompanied by: self  PERTINENT HISTORY: PMH: HTN, Hypothyroidism, arthritis,spinal stenosis, L foot drop, neuropathy  PAIN:  Are you having pain? No pt denies pain today   PRECAUTIONS: Fall  RED FLAGS: None   WEIGHT  BEARING RESTRICTIONS: No  FALLS: Has patient fallen in last 6 months? Yes. Number of falls see subjective from eval  LIVING ENVIRONMENT: Lives with: lives alone Lives in: House/apartment Stairs: Yes: External: 4 steps; on right going up Has following equipment at home: stand alone cane  PLOF: Independent with gait, Independent with transfers, and Requires assistive device for independence  PATIENT GOALS: be able to walk better, toss my cane, but not sure that will ever happen stay out of a wheelchair or walker - does use walker occasionally - in the yard I would like to not have surgery - Dr. Claudene thinks she needs to have it  OBJECTIVE:  Note: Objective measures were completed at Evaluation unless otherwise noted.  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  COGNITION: Overall cognitive status: reports memory impairments for about a year   SENSATION: Neuropathy in BLE (L>R)  COORDINATION: Impaired in BLE  EDEMA:  Feet and ankles get swollen sometimes   POSTURE: rounded shoulders, forward head, increased thoracic kyphosis, and posterior pelvic tilt  LOWER EXTREMITY ROM:     Active  Right Eval Left Eval  Hip flexion    Hip extension    Hip abduction    Hip adduction    Hip internal rotation    Hip external rotation    Knee flexion    Knee extension    Ankle dorsiflexion Tight gastroc Tight gastroc  Ankle plantarflexion    Ankle inversion    Ankle eversion     (Blank rows = not tested)  LOWER EXTREMITY MMT:    MMT Right Eval Left Eval  Hip flexion 5 5  Hip extension    Hip abduction    Hip adduction    Hip internal rotation    Hip external rotation    Knee flexion 3 3  Knee extension 4 4  Ankle dorsiflexion 3- 2-  Ankle plantarflexion    Ankle inversion    Ankle eversion    (Blank rows = not tested)  BED  MOBILITY:  Mod I via logroll per patient report  TRANSFERS: Sit to stand: SBA  Assistive device utilized: None     Stand to sit: SBA  Assistive device utilized: None     Chair to chair: SBA  Assistive device utilized: None       RAMP:  Not tested  CURB:  Not tested  STAIRS: Not tested GAIT: Findings:  Gait pattern: decreased hip/knee flexion- Left, decreased ankle dorsiflexion- Right, decreased ankle dorsiflexion- Left, poor foot clearance- Right, and poor foot clearance- Left Distance walked: various clinic distances Assistive device utilized: stand alone cane Level of assistance: Min A Comments: to be further assessed  FUNCTIONAL TESTS:     07/26/24 0001  Standardized Balance Assessment  Standardized Balance Assessment Berg Balance Test  Berg Balance Test  Sit to Stand 3 (doesn't use hands but very unstead and often needs time to stabilize or falls backwards but doesn't use assistance)  Standing Unsupported 0 (Only able to stand once patient orients themselves or uses chair ot balance)  Sitting with Back Unsupported but Feet Supported on Floor or Stool 4  Stand to Sit 1  Transfers 2  Standing Unsupported with Eyes Closed 0 (Only able to stand once patient orients themselves or uses chair ot balance)  Standing Unsupported with Feet Together 1 (Only able to stand once patient orients themselves or uses chair ot balance)  From Standing, Reach Forward with Outstretched Arm 0 (Must hold on to chair)  From Standing Position, Pick up Object from Floor 0  From Standing Position, Turn to Look Behind Over each Shoulder 0 (Must hold on to chair)  Turn 360 Degrees 1  Standing Unsupported, Alternately Place Feet on Step/Stool 1  Standing Unsupported, One Foot in Front 1  Standing on One Leg 0  Total Score 14  Berg comment: High risk for falls                                                                                                                                   TREATMENT DATE:  08/11/24   TherAct: Discussed safety with having a life alert or having phone on her at all time in case pt can't get up off the floor and does not have assistance. Pt reports that her neighbors have ordered her a life alert and she still has to work on getting it set up properly   [facility] PT Assessment - 08/11/24 1025       Berg Balance Test   Sit to Stand Able to stand  independently using hands   doesn't use hands but unsteady and often needs time to stabilize or falls backwards but doesn't use assistance   Standing Unsupported Unable to stand 30 seconds unassisted   7 seconds   Sitting with Back Unsupported but Feet Supported on Floor or Stool Able to sit safely and securely 2 minutes    Stand to Sit Sits independently, has uncontrolled descent   uncontrolled descent   Transfers Able to transfer safely, minor use of hands    Standing Unsupported with Eyes Closed Needs help to keep from falling   1 second, falls backwards onto chair   Standing Unsupported with Feet Together Needs help to attain position and unable to hold for 15 seconds   holds for 4.5 seconds before  falling back to chair   From Standing, Reach Forward with Outstretched Arm Loses balance while trying/requires external support    From Standing Position, Pick up Object from Floor Unable to try/needs assist to keep balance    From Standing Position, Turn to Look Behind  Over each Shoulder Needs assist to keep from losing balance and falling    Turn 360 Degrees Needs close supervision or verbal cueing    Standing Unsupported, Alternately Place Feet on Step/Stool Able to complete >2 steps/needs minimal assist    Standing Unsupported, One Foot in [employer] balance while stepping or standing   2 seconds   Standing on One Leg Unable to try or needs assist to prevent fall   holds for half a second   Total Score 14    Berg comment: 14/56 (close to 100% chance of falling)         Pt falls back posteriorly on  mat table frequently during BERG assessment   TherEx: For BLE strengthening:  Leg press with BLE, 40# 10 reps and 50# 10 reps, pt reporting medium difficulty, cues for eccentric control  Sidelying hip ABD 2 x 10 reps with 2# ankle weights, cued for holding for a few seconds     PATIENT EDUCATION: Education details: continue HEP, continued to educate on use of AD at all times due to significant fall risk (preferably a RW, but pt does not want to use one), results of BERG and fall risk  Person educated: Patient Education method: Explanation, Demonstration, Tactile cues, and Verbal cues Education comprehension: verbalized understanding, returned demonstration, and needs further education  HOME EXERCISE PROGRAM:  Access Code: [email or web address] URL: [email or web address] Date: 07/28/2024 Prepared by: Rock Kussmaul  Exercises - bolded exercises added to previous HEP - Seated March  - 1-2 x daily - 5 x weekly - 2 sets - 10 reps - Seated Heel Raise  - 1-2 x daily - 5 x weekly - 2 sets - 10 reps - Seated Toe Raise  - 1-2 x daily - 5 x weekly - 2 sets - 10 reps - Standing Hip Extension with Counter Support  - 2 x daily - 7 x weekly - 1 sets - 10 reps - Standing Hip Abduction with Counter Support  - 2 x daily - 7 x weekly - 1 sets - 10 reps - Standing March with Counter Support  - 2 x daily - 7 x weekly - 10 reps - Single Leg Bridge  - 1 x daily - 7 x weekly - 3 sets - 10 reps - Clam with Resistance  - 1 x daily - 7 x weekly - 3 sets - 10 reps   GOALS: Goals reviewed with patient? Yes  SHORT TERM GOALS: Target date: 08/11/2024  Pt will be independent with initial HEP for improved strength, balance, transfers and gait. Baseline: Goal status: Goal met - pt states she knows what she is supposed to do - 08-09-24  2.  Pt will improve gait velocity to at least 1.75 ft/sec for improved gait efficiency and performance at CGA level  Baseline: 1.66 ft/sec with stand alone cane min A (10/23);  15.78 secs = 2.08 ft/sec Goal status: Goal met 08-09-24  3.  Pt will improve 5 x STS to less than or equal to 25 seconds to demonstrate improved functional strength and transfer efficiency.  Baseline: 29.4 sec with no UE but posterior LOB and min A (10/23); 23.03 secs without UE support from mat - SBA Goal status: Goal met 08-09-24  4.  Pt will improve normal TUG to less than or equal to 16 seconds for improved functional mobility and decreased fall risk. Baseline: 19.35 sec with stand alone cane and min A (10/23); 18.31 secs with SPC - SBA  Goal status: Partially  met 08-09-24  5.  Berg balance assessment will improve from 14/56 to at least 19/56 in order to demonstrate decreased risk of falls. Baseline: 14/56   14/56 (11/20) Goal status: NOT MET     LONG TERM GOALS: Target date: 09/08/2024   Pt will be independent with final HEP for improved strength, balance, transfers and gait. Baseline:  Goal status: INITIAL  2.  Pt will improve gait velocity to at least 2.0 ft/sec for improved gait efficiency and performance at SBA level  Baseline: 1.66 ft/sec with stand alone cane min A (10/23) Goal status: INITIAL  3.  Pt will improve 5 x STS to less than or equal to 21 seconds to demonstrate improved functional strength and transfer efficiency.  Baseline:  29.4 sec with no UE but posterior LOB and min A (10/23) Goal status: INITIAL  4.  Pt will improve normal TUG to less than or equal to 13 seconds for improved functional mobility and decreased fall risk. Baseline: 19.35 sec with stand alone cane and min A (10/23) Goal status: INITIAL  5.  Berg balance assessment will improve to at least 24/56 in order to demonstrate decreased risk of falls. Baseline: 14/56 Goal status: INITIAL    ASSESSMENT:  CLINICAL IMPRESSION: Today's skilled session focused on finishing assessment of STGs with BERG assessment. Pt scored a 14/56 (previously a 14/56), indicating a close to 100% risk of  falling. Pt unable to stand unsupported for >7 seconds  before losing balance posteriorly back to mat table. Pt did not meet STG. Continued to educate on use of RW to help decr risk of falls due to impaired balance and decr strength. Despite education regarding fall risk, pt continues to use stand alone cane. Remainder of session continued to focus on BLE strength with leg press and hip ABDuctor strength, will continue per POC as able.      OBJECTIVE IMPAIRMENTS: Abnormal gait, decreased balance, decreased cognition, decreased coordination, difficulty walking, decreased ROM, decreased strength, impaired perceived functional ability, impaired sensation, improper body mechanics, postural dysfunction, and pain.   ACTIVITY LIMITATIONS: carrying, lifting, bending, standing, stairs, transfers, bed mobility, and locomotion level  PARTICIPATION LIMITATIONS: meal prep, cleaning, laundry, driving, community activity, and yard work  PERSONAL FACTORS: Age, Time since onset of injury/illness/exacerbation, and 3+ comorbidities:   PMH: HTN, Hypothyroidism, arthritis,spinal stenosis, L foot drop, neuropathyare also affecting patient's functional outcome.   REHAB POTENTIAL: Fair has tried PT in the past, buy-in regarding AD recommendations, age, cervical spinal stenosis, neuropathy, foot drop  CLINICAL DECISION MAKING: Unstable/unpredictable  EVALUATION COMPLEXITY: High  PLAN:  PT FREQUENCY: 2x/week  PT DURATION: 8 weeks  PLANNED INTERVENTIONS: [id number]- PT Re-evaluation, 97750- Physical Performance Testing, 97110-Therapeutic exercises, 97530- Therapeutic activity, [id number]- Neuromuscular re-education, 97535- Self Care, [id number]- Manual therapy, [id number]- Gait training, [phone]- Orthotic Initial, [phone]- Orthotic/Prosthetic subsequent, [phone]- Aquatic Therapy, [phone]- Electrical stimulation (manual), [phone] (1-2 muscles), [id number] (3+ muscles)- Dry Needling, Patient/Family education, Balance training, Stair training, Taping,  Joint mobilization, Spinal mobilization, Cognitive remediation, DME instructions, Cryotherapy, and Moist heat  PLAN FOR NEXT SESSION: leg press for quad strengthening  work on LE placement and anterior weight shift with sit to stands, toe taps in standing, quad and gluteal strengthening in standing and seated/supine. Add to HEP accordingly (I.e. seated and supine until safe to perform standing at home), SLS  Ketina Mars, PT, DPT 08/11/24 1:15 PM

## 2024-08-16 ENCOUNTER — Ambulatory Visit: Admitting: Physical Therapy

## 2024-08-16 DIAGNOSIS — R2689 Other abnormalities of gait and mobility: Secondary | ICD-10-CM | POA: Diagnosis not present

## 2024-08-16 DIAGNOSIS — M6281 Muscle weakness (generalized): Secondary | ICD-10-CM | POA: Diagnosis not present

## 2024-08-16 DIAGNOSIS — R2681 Unsteadiness on feet: Secondary | ICD-10-CM

## 2024-08-16 NOTE — Therapy (Signed)
 OUTPATIENT PHYSICAL THERAPY NEURO TREATMENT   Patient Name: Carol Woods MRN: [id number] DOB:1933-11-17, 89 y.o., female Today's Date: 08/16/2024   PCP: Elnor Lauraine FORBES, NP REFERRING PROVIDER: Claudene Arthea HERO, DO  END OF SESSION:  PT End of Session - 08/16/24 1150     Visit Number 8    Number of Visits 17   with eval   Date for Recertification  09/22/24    Authorization Type [employer]    PT Start Time 1148   pt arrived late   PT Stop Time 1229    PT Time Calculation (min) 41 min    Equipment Utilized During Treatment Gait belt    Activity Tolerance Patient tolerated treatment well    Behavior During Therapy [facility] for tasks assessed/performed               Past Medical History:  Diagnosis Date   Arthritis    back; shoulders (08/31/2014)   Atypical chest pain 11/21/2019   Essential hypertension 07/13/2018   GERD (gastroesophageal reflux disease)    OTC, changed diet   High cholesterol    History of cataract surgery 02/05/2015   2nd eye done 07/23/2015   Hypertension    Hypothyroidism    Past Surgical History:  Procedure Laterality Date   ANTERIOR CERVICAL DECOMP/DISCECTOMY FUSION  02/2010   Dr Colon   BACK SURGERY     COLONOSCOPY W/ POLYPECTOMY     DILATION AND CURETTAGE OF UTERUS     POSTERIOR LUMBAR FUSION  04/2010   Dr Colon   TONSILLECTOMY     age 29   TOTAL SHOULDER ARTHROPLASTY Right 08/31/2014   TOTAL SHOULDER ARTHROPLASTY Right 08/31/2014   Procedure: TOTAL SHOULDER ARTHROPLASTY;  Surgeon: Eva Elsie Herring, MD;  Location: [facility] OR;  Service: Orthopedics;  Laterality: Right;  Right total shoulder arthroplasty   Patient Active Problem List   Diagnosis Date Noted   Osteoporosis 07/15/2024   Degenerative arthritis of left knee 03/17/2024   Dry skin 03/17/2024   COVID-19 04/23/2023   Spinal stenosis in cervical region 04/15/2023   Diarrhea 03/06/2023   Falls 03/06/2023   Fatigue 03/06/2023   Forgetfulness 03/06/2023   Light  headedness 03/06/2023   Mass of hard palate 03/06/2023   Hypokalemia 03/06/2023   Hypothyroidism 03/06/2023   Hyperlipidemia 03/06/2023   Hypocalcemia 03/06/2023   Contusion of chest 02/05/2023   Left foot drop 10/31/2022   Pill dysphagia 07/11/2022   Change in bowel habits 07/11/2022   History of colonic polyps 07/11/2022   GERD (gastroesophageal reflux disease) 11/21/2019   Atypical chest pain 11/21/2019   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   Essential hypertension 07/13/2018   Lumbar spondylosis 06/10/2018   Thoracic spinal stenosis 03/18/2018   Spinal stenosis of lumbar region 03/18/2018   Gait abnormality 02/04/2018   Paresthesia 02/04/2018   MGUS (monoclonal gammopathy of unknown significance) 12/02/2017   Status post total shoulder arthroplasty 08/31/2014    ONSET DATE: 06/13/2024 (referral date)  REFERRING DIAG: R26.9 (ICD-10-CM) - Gait abnormality R26.89 (ICD-10-CM) - Balance problem  THERAPY DIAG:  Unsteadiness on feet  Other abnormalities of gait and mobility  Muscle weakness (generalized)  Rationale for Evaluation and Treatment: Rehabilitation  SUBJECTIVE:  SUBJECTIVE STATEMENT:  Pt was running a little late to her appointment today because she accidentally started driving towards her church. She denies any falls since last visit. She reports that her L foot decided yesterday to not listen to her. Pt wearing her Life Alert today.  Pt accompanied by: self  PERTINENT HISTORY: PMH: HTN, Hypothyroidism, arthritis,spinal stenosis, L foot drop, neuropathy  PAIN:  Are you having pain? No pt denies pain today   PRECAUTIONS: Fall  RED FLAGS: None   WEIGHT BEARING RESTRICTIONS: No  FALLS: Has patient fallen in last 6 months? Yes.  Number of falls see subjective from eval  LIVING ENVIRONMENT: Lives with: lives alone Lives in: House/apartment Stairs: Yes: External: 4 steps; on right going up Has following equipment at home: stand alone cane  PLOF: Independent with gait, Independent with transfers, and Requires assistive device for independence  PATIENT GOALS: be able to walk better, toss my cane, but not sure that will ever happen stay out of a wheelchair or walker - does use walker occasionally - in the yard I would like to not have surgery - Dr. Claudene thinks she needs to have it  OBJECTIVE:  Note: Objective measures were completed at Evaluation unless otherwise noted.  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  COGNITION: Overall cognitive status: reports memory impairments for about a year   SENSATION: Neuropathy in BLE (L>R)  COORDINATION: Impaired in BLE  EDEMA:  Feet and ankles get swollen sometimes   POSTURE: rounded shoulders, forward head, increased thoracic kyphosis, and posterior pelvic tilt  LOWER EXTREMITY ROM:     Active  Right Eval Left Eval  Hip flexion    Hip extension    Hip abduction    Hip adduction    Hip internal rotation    Hip external rotation    Knee flexion    Knee extension    Ankle dorsiflexion Tight gastroc Tight gastroc  Ankle plantarflexion    Ankle inversion    Ankle eversion     (Blank rows = not tested)  LOWER EXTREMITY MMT:    MMT Right Eval Left Eval  Hip flexion 5 5  Hip extension    Hip abduction    Hip adduction    Hip internal rotation    Hip external rotation    Knee flexion 3 3  Knee extension 4 4  Ankle dorsiflexion 3- 2-  Ankle plantarflexion    Ankle inversion    Ankle eversion    (Blank rows = not tested)  BED MOBILITY:  Mod I via logroll per patient report  TRANSFERS: Sit to stand: SBA   Assistive device utilized: None     Stand to sit: SBA  Assistive device utilized: None     Chair to chair: SBA  Assistive device utilized: None       RAMP:  Not tested  CURB:  Not tested  STAIRS: Not tested GAIT: Findings:  Gait pattern: decreased hip/knee flexion- Left, decreased ankle dorsiflexion- Right, decreased ankle dorsiflexion- Left, poor foot clearance- Right, and poor foot clearance- Left Distance walked: various clinic distances Assistive device utilized: stand alone cane Level of assistance: Min A Comments: to be further assessed   FUNCTIONAL TESTS:     07/26/24 0001  Standardized Balance Assessment  Standardized Balance Assessment Berg Balance Test  Berg Balance Test  Sit to Stand 3 (doesn't  use hands but very unstead and often needs time to stabilize or falls backwards but doesn't use assistance)  Standing Unsupported 0 (Only able to stand once patient orients themselves or uses chair ot balance)  Sitting with Back Unsupported but Feet Supported on Floor or Stool 4  Stand to Sit 1  Transfers 2  Standing Unsupported with Eyes Closed 0 (Only able to stand once patient orients themselves or uses chair ot balance)  Standing Unsupported with Feet Together 1 (Only able to stand once patient orients themselves or uses chair ot balance)  From Standing, Reach Forward with Outstretched Arm 0 (Must hold on to chair)  From Standing Position, Pick up Object from Floor 0  From Standing Position, Turn to Look Behind Over each Shoulder 0 (Must hold on to chair)  Turn 360 Degrees 1  Standing Unsupported, Alternately Place Feet on Step/Stool 1  Standing Unsupported, One Foot in Front 1  Standing on One Leg 0  Total Score 14  Berg comment: High risk for falls                                                                                                                                  TREATMENT DATE:  08/16/24   TherAct: To work on anterior weight shift with  sit to stands: Sit to stand to blue wedge 2 x 10 reps Static stance on blue wedge with unweighted ball chest press 2 x 10 reps Static stance on blue wedge with ball toss x 8 reps, x 10 reps Onset of B ankle soreness To work on SLS stability: Standing alt L/R gumdrop taps with min HHA Unsteady even in static stance prior to initiating gumdrop taps Static stance with no UE support, unable to balance herself Added in stand alone cane and min HHA Gait x 115 ft with stand alone cane and with min HHA, x 115 ft with just stand alone cane    PATIENT EDUCATION: Education details: continue HEP, continued to educate on use of AD at all times due to significant fall risk (preferably a RW, but pt does not want to use one) Person educated: Patient Education method: Explanation, Demonstration, Tactile cues, and Verbal cues Education comprehension: verbalized understanding, returned demonstration, and needs further education  HOME EXERCISE PROGRAM:  Access Code: [email or web address] URL: [email or web address] Date: 07/28/2024 Prepared by: Rock Kussmaul  Exercises - bolded exercises added to previous HEP - Seated March  - 1-2 x daily - 5 x weekly - 2 sets - 10 reps - Seated Heel Raise  - 1-2 x daily - 5 x weekly - 2 sets - 10 reps - Seated Toe Raise  - 1-2 x daily - 5 x weekly - 2 sets - 10 reps - Standing Hip Extension with Counter Support  - 2 x daily - 7 x weekly - 1 sets - 10 reps - Standing Hip Abduction with Counter Support  - 2 x daily -  7 x weekly - 1 sets - 10 reps - Standing March with Counter Support  - 2 x daily - 7 x weekly - 10 reps - Single Leg Bridge  - 1 x daily - 7 x weekly - 3 sets - 10 reps - Clam with Resistance  - 1 x daily - 7 x weekly - 3 sets - 10 reps   GOALS: Goals reviewed with patient? Yes  SHORT TERM GOALS: Target date: 08/11/2024  Pt will be independent with initial HEP for improved strength, balance, transfers and gait. Baseline: Goal status: Goal met - pt  states she knows what she is supposed to do - 08-09-24  2.  Pt will improve gait velocity to at least 1.75 ft/sec for improved gait efficiency and performance at CGA level  Baseline: 1.66 ft/sec with stand alone cane min A (10/23); 15.78 secs = 2.08 ft/sec Goal status: Goal met 08-09-24  3.  Pt will improve 5 x STS to less than or equal to 25 seconds to demonstrate improved functional strength and transfer efficiency.  Baseline: 29.4 sec with no UE but posterior LOB and min A (10/23); 23.03 secs without UE support from mat - SBA Goal status: Goal met 08-09-24  4.  Pt will improve normal TUG to less than or equal to 16 seconds for improved functional mobility and decreased fall risk. Baseline: 19.35 sec with stand alone cane and min A (10/23); 18.31 secs with SPC - SBA  Goal status: Partially met 08-09-24  5.  Berg balance assessment will improve from 14/56 to at least 19/56 in order to demonstrate decreased risk of falls. Baseline: 14/56   14/56 (11/20) Goal status: NOT MET     LONG TERM GOALS: Target date: 09/08/2024   Pt will be independent with final HEP for improved strength, balance, transfers and gait. Baseline:  Goal status: INITIAL  2.  Pt will improve gait velocity to at least 2.0 ft/sec for improved gait efficiency and performance at SBA level  Baseline: 1.66 ft/sec with stand alone cane min A (10/23) Goal status: INITIAL  3.  Pt will improve 5 x STS to less than or equal to 21 seconds to demonstrate improved functional strength and transfer efficiency.  Baseline:  29.4 sec with no UE but posterior LOB and min A (10/23) Goal status: INITIAL  4.  Pt will improve normal TUG to less than or equal to 13 seconds for improved functional mobility and decreased fall risk. Baseline: 19.35 sec with stand alone cane and min A (10/23) Goal status: INITIAL  5.  Berg balance assessment will improve to at least 24/56 in order to demonstrate decreased risk of falls. Baseline:  14/56 Goal status: INITIAL    ASSESSMENT:  CLINICAL IMPRESSION: Emphasis of skilled PT session on working on anterior weight shift with sit to stand and posterior chain strengthening. Pt with improved performance with sit to stands onto wedge as well as static stance on wedge as session progresses, however when she goes to stand following this practice on solid ground she has severe LOB anteriorly and requires max A from therapist to catch her and prevent a fall. Pt then is unable to stand in static standing even with her stand alone cane support without min A. Her balance remains very poor and she remains a very high fall risk. Pt remains resistant to education regarding recommended AD use and safety regarding driving, living alone, etc. She continues to benefit from skilled PT to address her severe balance impairment  as she is safe and able. Continue POC.     OBJECTIVE IMPAIRMENTS: Abnormal gait, decreased balance, decreased cognition, decreased coordination, difficulty walking, decreased ROM, decreased strength, impaired perceived functional ability, impaired sensation, improper body mechanics, postural dysfunction, and pain.   ACTIVITY LIMITATIONS: carrying, lifting, bending, standing, stairs, transfers, bed mobility, and locomotion level  PARTICIPATION LIMITATIONS: meal prep, cleaning, laundry, driving, community activity, and yard work  PERSONAL FACTORS: Age, Time since onset of injury/illness/exacerbation, and 3+ comorbidities:   PMH: HTN, Hypothyroidism, arthritis,spinal stenosis, L foot drop, neuropathyare also affecting patient's functional outcome.   REHAB POTENTIAL: Fair has tried PT in the past, buy-in regarding AD recommendations, age, cervical spinal stenosis, neuropathy, foot drop  CLINICAL DECISION MAKING: Unstable/unpredictable  EVALUATION COMPLEXITY: High  PLAN:  PT FREQUENCY: 2x/week  PT DURATION: 8 weeks  PLANNED INTERVENTIONS: 97164- PT Re-evaluation, 97750-  Physical Performance Testing, 97110-Therapeutic exercises, 97530- Therapeutic activity, [id number]- Neuromuscular re-education, 97535- Self Care, [id number]- Manual therapy, [id number]- Gait training, [phone]- Orthotic Initial, [phone]- Orthotic/Prosthetic subsequent, [phone]- Aquatic Therapy, [phone]- Electrical stimulation (manual), [phone] (1-2 muscles), [id number] (3+ muscles)- Dry Needling, Patient/Family education, Balance training, Stair training, Taping, Joint mobilization, Spinal mobilization, Cognitive remediation, DME instructions, Cryotherapy, and Moist heat  PLAN FOR NEXT SESSION: leg press for quad strengthening  work on LE placement and anterior weight shift with sit to stands, toe taps in standing, quad and gluteal strengthening in standing and seated/supine. Add to HEP accordingly (I.e. seated and supine until safe to perform standing at home), SLS, eccentric step downs, anterior weight shift with sit to stand, gumdrop taps  Waddell Southgate, PT, DPT, CSRS  08/16/24 12:30 PM

## 2024-08-23 ENCOUNTER — Ambulatory Visit: Admitting: Physical Therapy

## 2024-08-23 DIAGNOSIS — R2681 Unsteadiness on feet: Secondary | ICD-10-CM | POA: Insufficient documentation

## 2024-08-23 DIAGNOSIS — M6281 Muscle weakness (generalized): Secondary | ICD-10-CM | POA: Diagnosis present

## 2024-08-23 DIAGNOSIS — R2689 Other abnormalities of gait and mobility: Secondary | ICD-10-CM | POA: Diagnosis present

## 2024-08-23 NOTE — Therapy (Signed)
 OUTPATIENT PHYSICAL THERAPY NEURO TREATMENT   Patient Name: Carol Woods MRN: [id number] DOB:Aug 30, 1934, 89 y.o., female Today's Date: 08/23/2024   PCP: Elnor Lauraine FORBES, NP REFERRING PROVIDER: Claudene Arthea HERO, DO  END OF SESSION:  PT End of Session - 08/23/24 1103     Visit Number 9    Number of Visits 17   with eval   Date for Recertification  09/22/24    Authorization Type [employer]    PT Start Time 1100    PT Stop Time 1149    PT Time Calculation (min) 49 min    Equipment Utilized During Treatment Gait belt    Activity Tolerance Patient tolerated treatment well    Behavior During Therapy WFL for tasks assessed/performed                Past Medical History:  Diagnosis Date   Arthritis    back; shoulders (08/31/2014)   Atypical chest pain 11/21/2019   Essential hypertension 07/13/2018   GERD (gastroesophageal reflux disease)    OTC, changed diet   High cholesterol    History of cataract surgery 02/05/2015   2nd eye done 07/23/2015   Hypertension    Hypothyroidism    Past Surgical History:  Procedure Laterality Date   ANTERIOR CERVICAL DECOMP/DISCECTOMY FUSION  02/2010   Dr Colon   BACK SURGERY     COLONOSCOPY W/ POLYPECTOMY     DILATION AND CURETTAGE OF UTERUS     POSTERIOR LUMBAR FUSION  04/2010   Dr Colon   TONSILLECTOMY     age 48   TOTAL SHOULDER ARTHROPLASTY Right 08/31/2014   TOTAL SHOULDER ARTHROPLASTY Right 08/31/2014   Procedure: TOTAL SHOULDER ARTHROPLASTY;  Surgeon: Eva Elsie Herring, MD;  Location: [facility] OR;  Service: Orthopedics;  Laterality: Right;  Right total shoulder arthroplasty   Patient Active Problem List   Diagnosis Date Noted   Osteoporosis 07/15/2024   Degenerative arthritis of left knee 03/17/2024   Dry skin 03/17/2024   COVID-19 04/23/2023   Spinal stenosis in cervical region 04/15/2023   Diarrhea 03/06/2023   Falls 03/06/2023   Fatigue 03/06/2023   Forgetfulness 03/06/2023   Light headedness 03/06/2023    Mass of hard palate 03/06/2023   Hypokalemia 03/06/2023   Hypothyroidism 03/06/2023   Hyperlipidemia 03/06/2023   Hypocalcemia 03/06/2023   Contusion of chest 02/05/2023   Left foot drop 10/31/2022   Pill dysphagia 07/11/2022   Change in bowel habits 07/11/2022   History of colonic polyps 07/11/2022   GERD (gastroesophageal reflux disease) 11/21/2019   Atypical chest pain 11/21/2019   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   Essential hypertension 07/13/2018   Lumbar spondylosis 06/10/2018   Thoracic spinal stenosis 03/18/2018   Spinal stenosis of lumbar region 03/18/2018   Gait abnormality 02/04/2018   Paresthesia 02/04/2018   MGUS (monoclonal gammopathy of unknown significance) 12/02/2017   Status post total shoulder arthroplasty 08/31/2014    ONSET DATE: 06/13/2024 (referral date)  REFERRING DIAG: R26.9 (ICD-10-CM) - Gait abnormality R26.89 (ICD-10-CM) - Balance problem  THERAPY DIAG:  Unsteadiness on feet  Other abnormalities of gait and mobility  Muscle weakness (generalized)  Rationale for Evaluation and Treatment: Rehabilitation  SUBJECTIVE:  SUBJECTIVE STATEMENT:  Pt denies any falls or acute changes since last visit. Pt reports that her L foot will brake and just stop moving when she is walking. She reports she is supposed to be getting a brace but hasn't had time...  Pt accompanied by: self  PERTINENT HISTORY: PMH: HTN, Hypothyroidism, arthritis,spinal stenosis, L foot drop, neuropathy  PAIN:  Are you having pain? No pt denies pain today   PRECAUTIONS: Fall  RED FLAGS: None   WEIGHT BEARING RESTRICTIONS: No  FALLS: Has patient fallen in last 6 months? Yes. Number of falls see subjective from eval  LIVING ENVIRONMENT: Lives  with: lives alone Lives in: House/apartment Stairs: Yes: External: 4 steps; on right going up Has following equipment at home: stand alone cane  PLOF: Independent with gait, Independent with transfers, and Requires assistive device for independence  PATIENT GOALS: be able to walk better, toss my cane, but not sure that will ever happen stay out of a wheelchair or walker - does use walker occasionally - in the yard I would like to not have surgery - Dr. Claudene thinks she needs to have it  OBJECTIVE:  Note: Objective measures were completed at Evaluation unless otherwise noted.  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  COGNITION: Overall cognitive status: reports memory impairments for about a year   SENSATION: Neuropathy in BLE (L>R)  COORDINATION: Impaired in BLE  EDEMA:  Feet and ankles get swollen sometimes   POSTURE: rounded shoulders, forward head, increased thoracic kyphosis, and posterior pelvic tilt  LOWER EXTREMITY ROM:     Active  Right Eval Left Eval  Hip flexion    Hip extension    Hip abduction    Hip adduction    Hip internal rotation    Hip external rotation    Knee flexion    Knee extension    Ankle dorsiflexion Tight gastroc Tight gastroc  Ankle plantarflexion    Ankle inversion    Ankle eversion     (Blank rows = not tested)  LOWER EXTREMITY MMT:    MMT Right Eval Left Eval  Hip flexion 5 5  Hip extension    Hip abduction    Hip adduction    Hip internal rotation    Hip external rotation    Knee flexion 3 3  Knee extension 4 4  Ankle dorsiflexion 3- 2-  Ankle plantarflexion    Ankle inversion    Ankle eversion    (Blank rows = not tested)  BED MOBILITY:  Mod I via logroll per patient report  TRANSFERS: Sit to stand: SBA  Assistive device utilized: None     Stand to sit: SBA  Assistive  device utilized: None     Chair to chair: SBA  Assistive device utilized: None       RAMP:  Not tested  CURB:  Not tested  STAIRS: Not tested GAIT: Findings:  Gait pattern: decreased hip/knee flexion- Left, decreased ankle dorsiflexion- Right, decreased ankle dorsiflexion- Left, poor foot clearance- Right, and poor foot clearance- Left Distance walked: various clinic distances Assistive device utilized: stand alone cane Level of assistance: Min A Comments: to be further assessed   FUNCTIONAL TESTS:     07/26/24 0001  Standardized Balance Assessment  Standardized Balance Assessment Berg Balance Test  Berg Balance Test  Sit to Stand 3 (doesn't use hands but very  unstead and often needs time to stabilize or falls backwards but doesn't use assistance)  Standing Unsupported 0 (Only able to stand once patient orients themselves or uses chair ot balance)  Sitting with Back Unsupported but Feet Supported on Floor or Stool 4  Stand to Sit 1  Transfers 2  Standing Unsupported with Eyes Closed 0 (Only able to stand once patient orients themselves or uses chair ot balance)  Standing Unsupported with Feet Together 1 (Only able to stand once patient orients themselves or uses chair ot balance)  From Standing, Reach Forward with Outstretched Arm 0 (Must hold on to chair)  From Standing Position, Pick up Object from Floor 0  From Standing Position, Turn to Look Behind Over each Shoulder 0 (Must hold on to chair)  Turn 360 Degrees 1  Standing Unsupported, Alternately Place Feet on Step/Stool 1  Standing Unsupported, One Foot in Front 1  Standing on One Leg 0  Total Score 14  Berg comment: High risk for falls                                                                                                                                  TREATMENT DATE:  08/23/24   TherAct: To work on BLE strengthening: Leg press with BLE 50#, 3 x 10 reps In // bars with BUE support: Alt L/R  4 eccentric step downs 3 x 10 reps B To work on static and dynamic balance and stepping strategy: Alt L/R forward step with ball toss Pt with decreased smoothness of movements (unable to step then toss, has to pause and catch her balance with UE on // bars) Static stance ball catch Pt tends to squat to catch and utilize stepping strategy for balance recovery Stop/go with gait with no AD and min A Improved balance with wide staggered stance vs normal stance Difficulty maintaining static standing balance Stop/go with gait with stand alone cane and CGA Significantly improved static standing balance    PATIENT EDUCATION: Education details: continue HEP, continued to educate on use of AD at all times due to significant fall risk (preferably a RW, but pt does not want to use one) Person educated: Patient Education method: Explanation, Demonstration, Tactile cues, and Verbal cues Education comprehension: verbalized understanding, returned demonstration, and needs further education  HOME EXERCISE PROGRAM:  Access Code: [email or web address] URL: [email or web address] Date: 07/28/2024 Prepared by: Rock Kussmaul  Exercises - bolded exercises added to previous HEP - Seated March  - 1-2 x daily - 5 x weekly - 2 sets - 10 reps - Seated Heel Raise  - 1-2 x daily - 5 x weekly - 2 sets - 10 reps - Seated Toe Raise  - 1-2 x daily - 5 x weekly - 2 sets - 10 reps - Standing Hip Extension with Counter Support  - 2 x daily - 7 x weekly - 1 sets - 10 reps - Standing Hip Abduction with Counter  Support  - 2 x daily - 7 x weekly - 1 sets - 10 reps - Standing March with Counter Support  - 2 x daily - 7 x weekly - 10 reps - Single Leg Bridge  - 1 x daily - 7 x weekly - 3 sets - 10 reps - Clam with Resistance  - 1 x daily - 7 x weekly - 3 sets - 10 reps   GOALS: Goals reviewed with patient? Yes  SHORT TERM GOALS: Target date: 08/11/2024  Pt will be independent with initial HEP for improved  strength, balance, transfers and gait. Baseline: Goal status: Goal met - pt states she knows what she is supposed to do - 08-09-24  2.  Pt will improve gait velocity to at least 1.75 ft/sec for improved gait efficiency and performance at CGA level  Baseline: 1.66 ft/sec with stand alone cane min A (10/23); 15.78 secs = 2.08 ft/sec Goal status: Goal met 08-09-24  3.  Pt will improve 5 x STS to less than or equal to 25 seconds to demonstrate improved functional strength and transfer efficiency.  Baseline: 29.4 sec with no UE but posterior LOB and min A (10/23); 23.03 secs without UE support from mat - SBA Goal status: Goal met 08-09-24  4.  Pt will improve normal TUG to less than or equal to 16 seconds for improved functional mobility and decreased fall risk. Baseline: 19.35 sec with stand alone cane and min A (10/23); 18.31 secs with SPC - SBA  Goal status: Partially met 08-09-24  5.  Berg balance assessment will improve from 14/56 to at least 19/56 in order to demonstrate decreased risk of falls. Baseline: 14/56   14/56 (11/20) Goal status: NOT MET     LONG TERM GOALS: Target date: 09/08/2024   Pt will be independent with final HEP for improved strength, balance, transfers and gait. Baseline:  Goal status: INITIAL  2.  Pt will improve gait velocity to at least 2.0 ft/sec for improved gait efficiency and performance at SBA level  Baseline: 1.66 ft/sec with stand alone cane min A (10/23) Goal status: INITIAL  3.  Pt will improve 5 x STS to less than or equal to 21 seconds to demonstrate improved functional strength and transfer efficiency.  Baseline:  29.4 sec with no UE but posterior LOB and min A (10/23) Goal status: INITIAL  4.  Pt will improve normal TUG to less than or equal to 13 seconds for improved functional mobility and decreased fall risk. Baseline: 19.35 sec with stand alone cane and min A (10/23) Goal status: INITIAL  5.  Berg balance assessment will improve to  at least 24/56 in order to demonstrate decreased risk of falls. Baseline: 14/56 Goal status: INITIAL    ASSESSMENT:  CLINICAL IMPRESSION: Emphasis of skilled PT session on working on BLE strengthening as well as static balance, dynamic balance, and use of stepping strategy for balance recovery. Pt with significant difficulty performing ball toss and ball catch task without utilizing UE support to catch her balance. She also struggles to maintain static standing balance without UE support but does exhibit good use of stepping strategy for balance recovery. She exhibits improved static standing balance with use of her stand-alone cane. Encouraged her to continue to use this device for improved balance and decreased fall risk. She continues to benefit from skilled PT to address her severe balance impairment as she is safe and able. Continue POC.     OBJECTIVE IMPAIRMENTS: Abnormal gait, decreased  balance, decreased cognition, decreased coordination, difficulty walking, decreased ROM, decreased strength, impaired perceived functional ability, impaired sensation, improper body mechanics, postural dysfunction, and pain.   ACTIVITY LIMITATIONS: carrying, lifting, bending, standing, stairs, transfers, bed mobility, and locomotion level  PARTICIPATION LIMITATIONS: meal prep, cleaning, laundry, driving, community activity, and yard work  PERSONAL FACTORS: Age, Time since onset of injury/illness/exacerbation, and 3+ comorbidities:   PMH: HTN, Hypothyroidism, arthritis,spinal stenosis, L foot drop, neuropathyare also affecting patient's functional outcome.   REHAB POTENTIAL: Fair has tried PT in the past, buy-in regarding AD recommendations, age, cervical spinal stenosis, neuropathy, foot drop  CLINICAL DECISION MAKING: Unstable/unpredictable  EVALUATION COMPLEXITY: High  PLAN:  PT FREQUENCY: 2x/week  PT DURATION: 8 weeks  PLANNED INTERVENTIONS: [id number]- PT Re-evaluation, 97750- Physical  Performance Testing, 97110-Therapeutic exercises, 97530- Therapeutic activity, [id number]- Neuromuscular re-education, 97535- Self Care, [id number]- Manual therapy, [id number]- Gait training, [phone]- Orthotic Initial, [phone]- Orthotic/Prosthetic subsequent, [phone]- Aquatic Therapy, [phone]- Electrical stimulation (manual), [phone] (1-2 muscles), [id number] (3+ muscles)- Dry Needling, Patient/Family education, Balance training, Stair training, Taping, Joint mobilization, Spinal mobilization, Cognitive remediation, DME instructions, Cryotherapy, and Moist heat  PLAN FOR NEXT SESSION: 10th PN, leg press for quad strengthening  work on LE placement and anterior weight shift with sit to stands, toe taps in standing, quad and gluteal strengthening in standing and seated/supine. Add to HEP accordingly (I.e. seated and supine until safe to perform standing at home), SLS, eccentric step downs, anterior weight shift with sit to stand, gumdrop taps, stop/go, stepping strategy, ankle strategy  Waddell [place]gate, PT, DPT, CSRS  08/23/24 11:49 AM

## 2024-08-25 ENCOUNTER — Ambulatory Visit: Admitting: Physical Therapy

## 2024-08-25 NOTE — Therapy (Incomplete)
 OUTPATIENT PHYSICAL THERAPY NEURO TREATMENT - 10th VISIT PROGRESS NOTE***   Patient Name: Carol Woods MRN: [id number] DOB:Apr 25, 1934, 89 y.o., female Today's Date: 08/25/2024   PCP: Elnor Lauraine FORBES, NP REFERRING PROVIDER: Claudene Arthea HERO, DO  Physical Therapy Progress Note   Dates of Reporting Period:*** - 08/25/2024  See Note below for Objective Data and Assessment of Progress/Goals.  Thank you for the referral of this patient. Waddell Southgate, PT, DPT, CSRS   END OF SESSION:          Past Medical History:  Diagnosis Date   Arthritis    back; shoulders (08/31/2014)   Atypical chest pain 11/21/2019   Essential hypertension 07/13/2018   GERD (gastroesophageal reflux disease)    OTC, changed diet   High cholesterol    History of cataract surgery 02/05/2015   2nd eye done 07/23/2015   Hypertension    Hypothyroidism    Past Surgical History:  Procedure Laterality Date   ANTERIOR CERVICAL DECOMP/DISCECTOMY FUSION  02/2010   Dr Colon   BACK SURGERY     COLONOSCOPY W/ POLYPECTOMY     DILATION AND CURETTAGE OF UTERUS     POSTERIOR LUMBAR FUSION  04/2010   Dr Colon   TONSILLECTOMY     age 80   TOTAL SHOULDER ARTHROPLASTY Right 08/31/2014   TOTAL SHOULDER ARTHROPLASTY Right 08/31/2014   Procedure: TOTAL SHOULDER ARTHROPLASTY;  Surgeon: Eva Elsie Herring, MD;  Location: [facility] OR;  Service: Orthopedics;  Laterality: Right;  Right total shoulder arthroplasty   Patient Active Problem List   Diagnosis Date Noted   Osteoporosis 07/15/2024   Degenerative arthritis of left knee 03/17/2024   Dry skin 03/17/2024   COVID-19 04/23/2023   Spinal stenosis in cervical region 04/15/2023   Diarrhea 03/06/2023   Falls 03/06/2023   Fatigue 03/06/2023   Forgetfulness 03/06/2023   Light headedness 03/06/2023   Mass of hard palate 03/06/2023   Hypokalemia 03/06/2023   Hypothyroidism 03/06/2023   Hyperlipidemia 03/06/2023   Hypocalcemia 03/06/2023   Contusion of chest  02/05/2023   Left foot drop 10/31/2022   Pill dysphagia 07/11/2022   Change in bowel habits 07/11/2022   History of colonic polyps 07/11/2022   GERD (gastroesophageal reflux disease) 11/21/2019   Atypical chest pain 11/21/2019   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  Rationale for Evaluation and Treatment: Rehabilitation  SUBJECTIVE:  SUBJECTIVE STATEMENT:  Pt denies any falls or acute changes since last visit. Pt reports that her L foot will brake and just stop moving when she is walking. She reports she is supposed to be getting a brace but hasn't had time...  ***  Pt accompanied by: self  PERTINENT HISTORY: PMH: HTN, Hypothyroidism, arthritis,spinal stenosis, L foot drop, neuropathy  PAIN:  Are you having pain? No pt denies pain today   PRECAUTIONS: Fall  RED FLAGS: None   WEIGHT BEARING RESTRICTIONS: No  FALLS: Has patient fallen in last 6 months? Yes. Number of falls see subjective from eval  LIVING ENVIRONMENT: Lives with: lives alone Lives in: House/apartment Stairs: Yes: External: 4 steps; on right going up Has following equipment at home: stand alone cane  PLOF: Independent with gait, Independent with transfers, and Requires assistive device  for independence  PATIENT GOALS: be able to walk better, toss my cane, but not sure that will ever happen stay out of a wheelchair or walker - does use walker occasionally - in the yard I would like to not have surgery - Dr. Claudene thinks she needs to have it  OBJECTIVE:  Note: Objective measures were completed at Evaluation unless otherwise noted.  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  COGNITION: Overall cognitive status: reports memory impairments for about a year   SENSATION: Neuropathy in BLE (L>R)  COORDINATION: Impaired in BLE  EDEMA:  Feet and ankles get swollen sometimes   POSTURE: rounded shoulders, forward head, increased thoracic kyphosis, and posterior pelvic tilt  LOWER EXTREMITY ROM:     Active  Right Eval Left Eval  Hip flexion    Hip extension    Hip abduction    Hip adduction    Hip internal rotation    Hip external rotation    Knee flexion    Knee extension    Ankle dorsiflexion Tight gastroc Tight gastroc  Ankle plantarflexion    Ankle inversion    Ankle eversion     (Blank rows = not tested)  LOWER EXTREMITY MMT:    MMT Right Eval Left Eval  Hip flexion 5 5  Hip extension    Hip abduction    Hip adduction    Hip internal rotation    Hip external rotation    Knee flexion 3 3  Knee extension 4 4  Ankle dorsiflexion 3- 2-  Ankle plantarflexion    Ankle inversion    Ankle eversion    (Blank rows = not tested)  BED MOBILITY:  Mod I via logroll per patient report  TRANSFERS: Sit to stand: SBA  Assistive device utilized: None     Stand to sit: SBA  Assistive device utilized: None     Chair to chair: SBA  Assistive device utilized: None       RAMP:  Not tested  CURB:  Not tested  STAIRS: Not tested GAIT: Findings:  Gait pattern: decreased hip/knee flexion- Left, decreased ankle  dorsiflexion- Right, decreased ankle dorsiflexion- Left, poor foot clearance- Right, and poor foot clearance- Left Distance walked: various clinic distances Assistive device utilized: stand alone cane Level of assistance: Min A Comments: to be further assessed   FUNCTIONAL TESTS:     07/26/24 0001  Standardized Balance Assessment  Standardized Balance Assessment Berg Balance Test  Berg Balance Test  Sit to Stand 3 (doesn't use hands  but very unstead and often needs time to stabilize or falls backwards but doesn't use assistance)  Standing Unsupported 0 (Only able to stand once patient orients themselves or uses chair ot balance)  Sitting with Back Unsupported but Feet Supported on Floor or Stool 4  Stand to Sit 1  Transfers 2  Standing Unsupported with Eyes Closed 0 (Only able to stand once patient orients themselves or uses chair ot balance)  Standing Unsupported with Feet Together 1 (Only able to stand once patient orients themselves or uses chair ot balance)  From Standing, Reach Forward with Outstretched Arm 0 (Must hold on to chair)  From Standing Position, Pick up Object from Floor 0  From Standing Position, Turn to Look Behind Over each Shoulder 0 (Must hold on to chair)  Turn 360 Degrees 1  Standing Unsupported, Alternately Place Feet on Step/Stool 1  Standing Unsupported, One Foot in Front 1  Standing on One Leg 0  Total Score 14  Berg comment: High risk for falls                                                                                                                                  TREATMENT DATE:  08/25/24   TherAct: To work on BLE strengthening: Leg press with BLE 50#, 3 x 10 reps In // bars with BUE support: Alt L/R 4 eccentric step downs 3 x 10 reps B To work on static and dynamic balance and stepping strategy: Alt L/R forward step with ball toss Pt with decreased smoothness of movements (unable to step then toss, has to pause and catch her  balance with UE on // bars) Static stance ball catch Pt tends to squat to catch and utilize stepping strategy for balance recovery Stop/go with gait with no AD and min A Improved balance with wide staggered stance vs normal stance Difficulty maintaining static standing balance Stop/go with gait with stand alone cane and CGA Significantly improved static standing balance ***    PATIENT EDUCATION: Education details: continue HEP, continued to educate on use of AD at all times due to significant fall risk (preferably a RW, but pt does not want to use one)*** Person educated: Patient Education method: Explanation, Demonstration, Tactile cues, and Verbal cues Education comprehension: verbalized understanding, returned demonstration, and needs further education  HOME EXERCISE PROGRAM:  Access Code: [email or web address] URL: [email or web address] Date: 07/28/2024 Prepared by: Rock Kussmaul  Exercises - bolded exercises added to previous HEP - Seated March  - 1-2 x daily - 5 x weekly - 2 sets - 10 reps - Seated Heel Raise  - 1-2 x daily - 5 x weekly - 2 sets - 10 reps - Seated Toe Raise  - 1-2 x daily - 5 x weekly - 2 sets - 10 reps - Standing Hip Extension with Counter Support  - 2 x daily - 7 x weekly - 1 sets - 10 reps - Standing Hip  Abduction with Counter Support  - 2 x daily - 7 x weekly - 1 sets - 10 reps - Standing March with Counter Support  - 2 x daily - 7 x weekly - 10 reps - Single Leg Bridge  - 1 x daily - 7 x weekly - 3 sets - 10 reps - Clam with Resistance  - 1 x daily - 7 x weekly - 3 sets - 10 reps   GOALS: Goals reviewed with patient? Yes  SHORT TERM GOALS: Target date: 08/11/2024  Pt will be independent with initial HEP for improved strength, balance, transfers and gait. Baseline: Goal status: Goal met - pt states she knows what she is supposed to do - 08-09-24  2.  Pt will improve gait velocity to at least 1.75 ft/sec for improved gait efficiency and  performance at CGA level  Baseline: 1.66 ft/sec with stand alone cane min A (10/23); 15.78 secs = 2.08 ft/sec Goal status: Goal met 08-09-24  3.  Pt will improve 5 x STS to less than or equal to 25 seconds to demonstrate improved functional strength and transfer efficiency.  Baseline: 29.4 sec with no UE but posterior LOB and min A (10/23); 23.03 secs without UE support from mat - SBA Goal status: Goal met 08-09-24  4.  Pt will improve normal TUG to less than or equal to 16 seconds for improved functional mobility and decreased fall risk. Baseline: 19.35 sec with stand alone cane and min A (10/23); 18.31 secs with SPC - SBA  Goal status: Partially met 08-09-24  5.  Berg balance assessment will improve from 14/56 to at least 19/56 in order to demonstrate decreased risk of falls. Baseline: 14/56   14/56 (11/20) Goal status: NOT MET     LONG TERM GOALS: Target date: 09/08/2024   Pt will be independent with final HEP for improved strength, balance, transfers and gait. Baseline:  Goal status: INITIAL  2.  Pt will improve gait velocity to at least 2.0 ft/sec for improved gait efficiency and performance at SBA level  Baseline: 1.66 ft/sec with stand alone cane min A (10/23) Goal status: INITIAL  3.  Pt will improve 5 x STS to less than or equal to 21 seconds to demonstrate improved functional strength and transfer efficiency.  Baseline:  29.4 sec with no UE but posterior LOB and min A (10/23) Goal status: INITIAL  4.  Pt will improve normal TUG to less than or equal to 13 seconds for improved functional mobility and decreased fall risk. Baseline: 19.35 sec with stand alone cane and min A (10/23) Goal status: INITIAL  5.  Berg balance assessment will improve to at least 24/56 in order to demonstrate decreased risk of falls. Baseline: 14/56 Goal status: INITIAL    ASSESSMENT:  CLINICAL IMPRESSION: Emphasis of skilled PT session on*** working on BLE strengthening as well as  static balance, dynamic balance, and use of stepping strategy for balance recovery. Pt with significant difficulty performing ball toss and ball catch task without utilizing UE support to catch her balance. She also struggles to maintain static standing balance without UE support but does exhibit good use of stepping strategy for balance recovery. She exhibits improved static standing balance with use of her stand-alone cane. Encouraged her to continue to use this device for improved balance and decreased fall risk. She continues to benefit from skilled PT to address her severe balance impairment as she is safe and able. Continue POC.     OBJECTIVE IMPAIRMENTS:  Abnormal gait, decreased balance, decreased cognition, decreased coordination, difficulty walking, decreased ROM, decreased strength, impaired perceived functional ability, impaired sensation, improper body mechanics, postural dysfunction, and pain.   ACTIVITY LIMITATIONS: carrying, lifting, bending, standing, stairs, transfers, bed mobility, and locomotion level  PARTICIPATION LIMITATIONS: meal prep, cleaning, laundry, driving, community activity, and yard work  PERSONAL FACTORS: Age, Time since onset of injury/illness/exacerbation, and 3+ comorbidities:   PMH: HTN, Hypothyroidism, arthritis,spinal stenosis, L foot drop, neuropathyare also affecting patient's functional outcome.   REHAB POTENTIAL: Fair has tried PT in the past, buy-in regarding AD recommendations, age, cervical spinal stenosis, neuropathy, foot drop  CLINICAL DECISION MAKING: Unstable/unpredictable  EVALUATION COMPLEXITY: High  PLAN:  PT FREQUENCY: 2x/week  PT DURATION: 8 weeks  PLANNED INTERVENTIONS: [id number]- PT Re-evaluation, 97750- Physical Performance Testing, 97110-Therapeutic exercises, 97530- Therapeutic activity, [id number]- Neuromuscular re-education, 97535- Self Care, [id number]- Manual therapy, [id number]- Gait training, [phone]- Orthotic Initial, [phone]- Orthotic/Prosthetic  subsequent, [phone]- Aquatic Therapy, [phone]- Electrical stimulation (manual), [phone] (1-2 muscles), [id number] (3+ muscles)- Dry Needling, Patient/Family education, Balance training, Stair training, Taping, Joint mobilization, Spinal mobilization, Cognitive remediation, DME instructions, Cryotherapy, and Moist heat  PLAN FOR NEXT SESSION: 10th PN, leg press for quad strengthening  work on LE placement and anterior weight shift with sit to stands, toe taps in standing, quad and gluteal strengthening in standing and seated/supine. Add to HEP accordingly (I.e. seated and supine until safe to perform standing at home), SLS, eccentric step downs, anterior weight shift with sit to stand, gumdrop taps, stop/go, stepping strategy, ankle strategy***  Waddell Southgate, PT, DPT, CSRS  08/25/24 7:40 AM

## 2024-08-30 ENCOUNTER — Encounter: Payer: Self-pay | Admitting: Physical Therapy

## 2024-08-30 ENCOUNTER — Ambulatory Visit: Admitting: Physical Therapy

## 2024-08-30 DIAGNOSIS — R2681 Unsteadiness on feet: Secondary | ICD-10-CM

## 2024-08-30 DIAGNOSIS — M6281 Muscle weakness (generalized): Secondary | ICD-10-CM

## 2024-08-30 DIAGNOSIS — R2689 Other abnormalities of gait and mobility: Secondary | ICD-10-CM

## 2024-08-30 NOTE — Therapy (Unsigned)
 OUTPATIENT PHYSICAL THERAPY NEURO TREATMENT/10TH VISIT PROGRESS NOTE   Patient Name: Carol Woods MRN: [id number] DOB:12/15/1933, 89 y.o., female Today's Date: 08/30/2024   PCP: Elnor Lauraine FORBES, NP REFERRING PROVIDER: Claudene Arthea HERO, DO  END OF SESSION:  PT End of Session - 08/30/24 1346     Visit Number 10    Number of Visits 17   with eval   Date for Recertification  09/22/24    Authorization Type [employer]    PT Start Time 1102    PT Stop Time 1146    PT Time Calculation (min) 44 min    Equipment Utilized During Treatment Gait belt    Activity Tolerance Patient tolerated treatment well    Behavior During Therapy WFL for tasks assessed/performed                 Past Medical History:  Diagnosis Date   Arthritis    back; shoulders (08/31/2014)   Atypical chest pain 11/21/2019   Essential hypertension 07/13/2018   GERD (gastroesophageal reflux disease)    OTC, changed diet   High cholesterol    History of cataract surgery 02/05/2015   2nd eye done 07/23/2015   Hypertension    Hypothyroidism    Past Surgical History:  Procedure Laterality Date   ANTERIOR CERVICAL DECOMP/DISCECTOMY FUSION  02/2010   Dr Colon   BACK SURGERY     COLONOSCOPY W/ POLYPECTOMY     DILATION AND CURETTAGE OF UTERUS     POSTERIOR LUMBAR FUSION  04/2010   Dr Colon   TONSILLECTOMY     age 50   TOTAL SHOULDER ARTHROPLASTY Right 08/31/2014   TOTAL SHOULDER ARTHROPLASTY Right 08/31/2014   Procedure: TOTAL SHOULDER ARTHROPLASTY;  Surgeon: Eva Elsie Herring, MD;  Location: [facility] OR;  Service: Orthopedics;  Laterality: Right;  Right total shoulder arthroplasty   Patient Active Problem List   Diagnosis Date Noted   Osteoporosis 07/15/2024   Degenerative arthritis of left knee 03/17/2024   Dry skin 03/17/2024   COVID-19 04/23/2023   Spinal stenosis in cervical region 04/15/2023   Diarrhea 03/06/2023   Falls 03/06/2023   Fatigue 03/06/2023   Forgetfulness 03/06/2023    Light headedness 03/06/2023   Mass of hard palate 03/06/2023   Hypokalemia 03/06/2023   Hypothyroidism 03/06/2023   Hyperlipidemia 03/06/2023   Hypocalcemia 03/06/2023   Contusion of chest 02/05/2023   Left foot drop 10/31/2022   Pill dysphagia 07/11/2022   Change in bowel habits 07/11/2022   History of colonic polyps 07/11/2022   GERD (gastroesophageal reflux disease) 11/21/2019   Atypical chest pain 11/21/2019   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   Essential hypertension 07/13/2018   Lumbar spondylosis 06/10/2018   Thoracic spinal stenosis 03/18/2018   Spinal stenosis of lumbar region 03/18/2018   Gait abnormality 02/04/2018   Paresthesia 02/04/2018   MGUS (monoclonal gammopathy of unknown significance) 12/02/2017   Status post total shoulder arthroplasty 08/31/2014    ONSET DATE: 06/13/2024 (referral date)  REFERRING DIAG: R26.9 (ICD-10-CM) - Gait abnormality R26.89 (ICD-10-CM) - Balance problem  THERAPY DIAG:  Unsteadiness on feet  Other abnormalities of gait and mobility  Muscle weakness (generalized)  Rationale for Evaluation and Treatment: Rehabilitation  SUBJECTIVE:  SUBJECTIVE STATEMENT:  Pt very frustrated due to having received a phone call this morning at 8:00 stating that she had a 9:00 appt today - pt says she knew she had a 9:00 appt tomorrow but did not know about one scheduled today - went to the location and they told her it was tomorrow - pt very anxious   Pt accompanied by: self  PERTINENT HISTORY: PMH: HTN, Hypothyroidism, arthritis,spinal stenosis, L foot drop, neuropathy  PAIN:  Are you having pain? No pt denies pain today   PRECAUTIONS: Fall  RED FLAGS: None   WEIGHT BEARING RESTRICTIONS: No  FALLS: Has patient  fallen in last 6 months? Yes. Number of falls see subjective from eval  LIVING ENVIRONMENT: Lives with: lives alone Lives in: House/apartment Stairs: Yes: External: 4 steps; on right going up Has following equipment at home: stand alone cane  PLOF: Independent with gait, Independent with transfers, and Requires assistive device for independence  PATIENT GOALS: be able to walk better, toss my cane, but not sure that will ever happen stay out of a wheelchair or walker - does use walker occasionally - in the yard I would like to not have surgery - Dr. Claudene thinks she needs to have it  OBJECTIVE:  Note: Objective measures were completed at Evaluation unless otherwise noted.  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  COGNITION: Overall cognitive status: reports memory impairments for about a year   SENSATION: Neuropathy in BLE (L>R)  COORDINATION: Impaired in BLE  EDEMA:  Feet and ankles get swollen sometimes   POSTURE: rounded shoulders, forward head, increased thoracic kyphosis, and posterior pelvic tilt  LOWER EXTREMITY ROM:     Active  Right Eval Left Eval  Hip flexion    Hip extension    Hip abduction    Hip adduction    Hip internal rotation    Hip external rotation    Knee flexion    Knee extension    Ankle dorsiflexion Tight gastroc Tight gastroc  Ankle plantarflexion    Ankle inversion    Ankle eversion     (Blank rows = not tested)  LOWER EXTREMITY MMT:    MMT Right Eval Left Eval  Hip flexion 5 5  Hip extension    Hip abduction    Hip adduction    Hip internal rotation    Hip external rotation    Knee flexion 3 3  Knee extension 4 4  Ankle dorsiflexion 3- 2-  Ankle plantarflexion    Ankle inversion    Ankle eversion    (Blank rows = not tested)  BED MOBILITY:  Mod I via logroll per patient  report  TRANSFERS: Sit to stand: SBA  Assistive device utilized: None     Stand to sit: SBA  Assistive device utilized: None     Chair to chair: SBA  Assistive device utilized: None       RAMP:  Not tested  CURB:  Not tested  STAIRS: Not tested GAIT: Findings:  Gait pattern: decreased hip/knee flexion- Left, decreased ankle dorsiflexion- Right, decreased ankle dorsiflexion- Left, poor foot clearance- Right, and poor foot clearance- Left Distance walked: various clinic distances Assistive device utilized: stand alone cane Level of assistance: Min A Comments: to be further assessed   FUNCTIONAL TESTS:     07/26/24 0001  Standardized Balance Assessment  Standardized Balance  Assessment Berg Balance Test  [employer] Test  Sit to Stand 3 (doesn't use hands but very unstead and often needs time to stabilize or falls backwards but doesn't use assistance)  Standing Unsupported 0 (Only able to stand once patient orients themselves or uses chair ot balance)  Sitting with Back Unsupported but Feet Supported on Floor or Stool 4  Stand to Sit 1  Transfers 2  Standing Unsupported with Eyes Closed 0 (Only able to stand once patient orients themselves or uses chair ot balance)  Standing Unsupported with Feet Together 1 (Only able to stand once patient orients themselves or uses chair ot balance)  From Standing, Reach Forward with Outstretched Arm 0 (Must hold on to chair)  From Standing Position, Pick up Object from Floor 0  From Standing Position, Turn to Look Behind Over each Shoulder 0 (Must hold on to chair)  Turn 360 Degrees 1  Standing Unsupported, Alternately Place Feet on Step/Stool 1  Standing Unsupported, One Foot in Front 1  Standing on One Leg 0  Total Score 14  Berg comment: High risk for falls                                                                                                                                  TREATMENT DATE:   08-30-24   TherAct: 5x sit to stand from chair - CGA for safety - pt used stepping strategy on all reps to regain balance upon initial standing SLS activity - kicking bean bags approx. 20' x 2 reps with CGA to min assist  Touching balance bubbles (3) 5 reps with each foot; used 2 balance bubbles to perform diagonal touches - 5 reps each LE - with min assist for balance recovery    TherEx: Leg press 50# bil. LE's 3 sets 10 reps - cues for slow movement and controlled return   Gait:  PATIENT EDUCATION: Education details: continue HEP, continued to educate on use of AD at all times due to significant fall risk (preferably a RW, but pt does not want to use one) Person educated: Patient Education method: Explanation, Demonstration, Tactile cues, and Verbal cues Education comprehension: verbalized understanding, returned demonstration, and needs further education  HOME EXERCISE PROGRAM:  Access Code: [email or web address] URL: [email or web address] Date: 07/28/2024 Prepared by: Rock Kussmaul  Exercises - bolded exercises added to previous HEP - Seated March  - 1-2 x daily - 5 x weekly - 2 sets - 10 reps - Seated Heel Raise  - 1-2 x daily - 5 x weekly - 2 sets - 10 reps - Seated Toe Raise  - 1-2 x daily - 5 x weekly - 2 sets - 10 reps - Standing Hip Extension with Counter Support  - 2 x daily - 7 x weekly - 1 sets - 10 reps - Standing Hip Abduction with Counter Support  - 2 x daily - 7 x weekly - 1 sets - 10  reps - Standing March with Counter Support  - 2 x daily - 7 x weekly - 10 reps - Single Leg Bridge  - 1 x daily - 7 x weekly - 3 sets - 10 reps - Clam with Resistance  - 1 x daily - 7 x weekly - 3 sets - 10 reps   GOALS: Goals reviewed with patient? Yes  SHORT TERM GOALS: Target date: 08/11/2024  Pt will be independent with initial HEP for improved strength, balance, transfers and gait. Baseline: Goal status: Goal met - pt states she knows what she is supposed to do -  08-09-24  2.  Pt will improve gait velocity to at least 1.75 ft/sec for improved gait efficiency and performance at CGA level  Baseline: 1.66 ft/sec with stand alone cane min A (10/23); 15.78 secs = 2.08 ft/sec Goal status: Goal met 08-09-24  3.  Pt will improve 5 x STS to less than or equal to 25 seconds to demonstrate improved functional strength and transfer efficiency.  Baseline: 29.4 sec with no UE but posterior LOB and min A (10/23); 23.03 secs without UE support from mat - SBA Goal status: Goal met 08-09-24  4.  Pt will improve normal TUG to less than or equal to 16 seconds for improved functional mobility and decreased fall risk. Baseline: 19.35 sec with stand alone cane and min A (10/23); 18.31 secs with SPC - SBA  Goal status: Partially met 08-09-24  5.  Berg balance assessment will improve from 14/56 to at least 19/56 in order to demonstrate decreased risk of falls. Baseline: 14/56   14/56 (11/20) Goal status: NOT MET     LONG TERM GOALS: Target date: 09/08/2024   Pt will be independent with final HEP for improved strength, balance, transfers and gait. Baseline:  Goal status: INITIAL  2.  Pt will improve gait velocity to at least 2.0 ft/sec for improved gait efficiency and performance at SBA level  Baseline: 1.66 ft/sec with stand alone cane min A (10/23) Goal status: INITIAL  3.  Pt will improve 5 x STS to less than or equal to 21 seconds to demonstrate improved functional strength and transfer efficiency.  Baseline:  29.4 sec with no UE but posterior LOB and min A (10/23) Goal status: INITIAL  4.  Pt will improve normal TUG to less than or equal to 13 seconds for improved functional mobility and decreased fall risk. Baseline: 19.35 sec with stand alone cane and min A (10/23) Goal status: INITIAL  5.  Berg balance assessment will improve to at least 24/56 in order to demonstrate decreased risk of falls. Baseline: 14/56 Goal status:  INITIAL    ASSESSMENT:  CLINICAL IMPRESSION: Emphasis of skilled PT session on working on BLE strengthening as well as static balance, dynamic balance, and use of stepping strategy for balance recovery. Pt with significant difficulty performing ball toss and ball catch task without utilizing UE support to catch her balance. She also struggles to maintain static standing balance without UE support but does exhibit good use of stepping strategy for balance recovery. She exhibits improved static standing balance with use of her stand-alone cane. Encouraged her to continue to use this device for improved balance and decreased fall risk. She continues to benefit from skilled PT to address her severe balance impairment as she is safe and able. Continue POC.     OBJECTIVE IMPAIRMENTS: Abnormal gait, decreased balance, decreased cognition, decreased coordination, difficulty walking, decreased ROM, decreased strength, impaired perceived functional ability,  impaired sensation, improper body mechanics, postural dysfunction, and pain.   ACTIVITY LIMITATIONS: carrying, lifting, bending, standing, stairs, transfers, bed mobility, and locomotion level  PARTICIPATION LIMITATIONS: meal prep, cleaning, laundry, driving, community activity, and yard work  PERSONAL FACTORS: Age, Time since onset of injury/illness/exacerbation, and 3+ comorbidities:   PMH: HTN, Hypothyroidism, arthritis,spinal stenosis, L foot drop, neuropathyare also affecting patient's functional outcome.   REHAB POTENTIAL: Fair has tried PT in the past, buy-in regarding AD recommendations, age, cervical spinal stenosis, neuropathy, foot drop  CLINICAL DECISION MAKING: Unstable/unpredictable  EVALUATION COMPLEXITY: High  PLAN:  PT FREQUENCY: 2x/week  PT DURATION: 8 weeks  PLANNED INTERVENTIONS: [id number]- PT Re-evaluation, 97750- Physical Performance Testing, 97110-Therapeutic exercises, 97530- Therapeutic activity, [id number]- Neuromuscular  re-education, 97535- Self Care, [id number]- Manual therapy, [id number]- Gait training, [phone]- Orthotic Initial, [phone]- Orthotic/Prosthetic subsequent, [phone]- Aquatic Therapy, [phone]- Electrical stimulation (manual), [phone] (1-2 muscles), [id number] (3+ muscles)- Dry Needling, Patient/Family education, Balance training, Stair training, Taping, Joint mobilization, Spinal mobilization, Cognitive remediation, DME instructions, Cryotherapy, and Moist heat  PLAN FOR NEXT SESSION: 10th PN, leg press for quad strengthening  work on LE placement and anterior weight shift with sit to stands, toe taps in standing, quad and gluteal strengthening in standing and seated/supine. Add to HEP accordingly (I.e. seated and supine until safe to perform standing at home), SLS, eccentric step downs, anterior weight shift with sit to stand, gumdrop taps, stop/go, stepping strategy, ankle strategy   Elvie Kussmaul, PT  08/30/24 1:50 PM

## 2024-08-31 ENCOUNTER — Inpatient Hospital Stay: Attending: Hematology and Oncology | Admitting: Hematology and Oncology

## 2024-08-31 ENCOUNTER — Telehealth: Payer: Self-pay | Admitting: Hematology and Oncology

## 2024-08-31 ENCOUNTER — Inpatient Hospital Stay

## 2024-08-31 VITALS — BP 149/59 | HR 73 | Temp 98.2°F | Resp 15 | Wt 121.3 lb

## 2024-08-31 DIAGNOSIS — Z8042 Family history of malignant neoplasm of prostate: Secondary | ICD-10-CM | POA: Insufficient documentation

## 2024-08-31 DIAGNOSIS — Z8041 Family history of malignant neoplasm of ovary: Secondary | ICD-10-CM | POA: Insufficient documentation

## 2024-08-31 DIAGNOSIS — D472 Monoclonal gammopathy: Secondary | ICD-10-CM

## 2024-08-31 DIAGNOSIS — Z803 Family history of malignant neoplasm of breast: Secondary | ICD-10-CM | POA: Insufficient documentation

## 2024-08-31 DIAGNOSIS — Z87891 Personal history of nicotine dependence: Secondary | ICD-10-CM | POA: Diagnosis not present

## 2024-08-31 LAB — CBC WITH DIFFERENTIAL (CANCER CENTER ONLY)
Abs Immature Granulocytes: 0.02 K/uL (ref 0.00–0.07)
Basophils Absolute: 0 K/uL (ref 0.0–0.1)
Basophils Relative: 0 %
Eosinophils Absolute: 0.1 K/uL (ref 0.0–0.5)
Eosinophils Relative: 2 %
HCT: 36.8 % (ref 36.0–46.0)
Hemoglobin: 12.6 g/dL (ref 12.0–15.0)
Immature Granulocytes: 0 %
Lymphocytes Relative: 24 %
Lymphs Abs: 1.7 K/uL (ref 0.7–4.0)
MCH: 29.6 pg (ref 26.0–34.0)
MCHC: 34.2 g/dL (ref 30.0–36.0)
MCV: 86.4 fL (ref 80.0–100.0)
Monocytes Absolute: 0.6 K/uL (ref 0.1–1.0)
Monocytes Relative: 8 %
Neutro Abs: 4.8 K/uL (ref 1.7–7.7)
Neutrophils Relative %: 66 %
Platelet Count: 215 K/uL (ref 150–400)
RBC: 4.26 MIL/uL (ref 3.87–5.11)
RDW: 13.4 % (ref 11.5–15.5)
WBC Count: 7.3 K/uL (ref 4.0–10.5)
nRBC: 0 % (ref 0.0–0.2)

## 2024-08-31 LAB — CMP (CANCER CENTER ONLY)
ALT: 12 U/L (ref 0–44)
AST: 18 U/L (ref 15–41)
Albumin: 4.5 g/dL (ref 3.5–5.0)
Alkaline Phosphatase: 65 U/L (ref 38–126)
Anion gap: 8 (ref 5–15)
BUN: 20 mg/dL (ref 8–23)
CO2: 28 mmol/L (ref 22–32)
Calcium: 9.8 mg/dL (ref 8.9–10.3)
Chloride: 106 mmol/L (ref 98–111)
Creatinine: 0.61 mg/dL (ref 0.44–1.00)
GFR, Estimated: >60 mL/min (ref >60)
Glucose, Bld: 99 mg/dL (ref 70–99)
Potassium: 3.7 mmol/L (ref 3.5–5.1)
Sodium: 142 mmol/L (ref 135–145)
Total Bilirubin: 0.5 mg/dL (ref 0.0–1.2)
Total Protein: 8 g/dL (ref 6.5–8.1)

## 2024-08-31 LAB — LACTATE DEHYDROGENASE: LDH: 175 U/L (ref 105–235)

## 2024-09-01 ENCOUNTER — Ambulatory Visit: Admitting: Physical Therapy

## 2024-09-01 DIAGNOSIS — M6281 Muscle weakness (generalized): Secondary | ICD-10-CM

## 2024-09-01 DIAGNOSIS — R2681 Unsteadiness on feet: Secondary | ICD-10-CM

## 2024-09-01 DIAGNOSIS — R2689 Other abnormalities of gait and mobility: Secondary | ICD-10-CM

## 2024-09-01 LAB — KAPPA/LAMBDA LIGHT CHAINS
Kappa free light chain: 14.9 mg/L (ref 3.3–19.4)
Kappa, lambda light chain ratio: 0.6 (ref 0.26–1.65)
Lambda free light chains: 25 mg/L (ref 5.7–26.3)

## 2024-09-01 LAB — BETA 2 MICROGLOBULIN, SERUM: Beta-2 Microglobulin: 2.1 mg/L (ref 0.6–2.4)

## 2024-09-01 NOTE — Therapy (Signed)
 OUTPATIENT PHYSICAL THERAPY NEURO TREATMENT       Patient Name: Carol Woods MRN: [id number] DOB:Mar 16, 1934, 89 y.o., female Today's Date: 09/01/2024   PCP: Elnor Lauraine FORBES, NP REFERRING PROVIDER: Claudene Arthea HERO, DO  END OF SESSION:  PT End of Session - 09/01/24 1105     Visit Number 11    Number of Visits 17   with eval   Date for Recertification  09/22/24    Authorization Type [employer]    PT Start Time 1100    PT Stop Time 1145    PT Time Calculation (min) 45 min    Equipment Utilized During Treatment Gait belt    Activity Tolerance Patient tolerated treatment well    Behavior During Therapy WFL for tasks assessed/performed                  Past Medical History:  Diagnosis Date   Arthritis    back; shoulders (08/31/2014)   Atypical chest pain 11/21/2019   Essential hypertension 07/13/2018   GERD (gastroesophageal reflux disease)    OTC, changed diet   High cholesterol    History of cataract surgery 02/05/2015   2nd eye done 07/23/2015   Hypertension    Hypothyroidism    Past Surgical History:  Procedure Laterality Date   ANTERIOR CERVICAL DECOMP/DISCECTOMY FUSION  02/2010   Dr Colon   BACK SURGERY     COLONOSCOPY W/ POLYPECTOMY     DILATION AND CURETTAGE OF UTERUS     POSTERIOR LUMBAR FUSION  04/2010   Dr Colon   TONSILLECTOMY     age 27   TOTAL SHOULDER ARTHROPLASTY Right 08/31/2014   TOTAL SHOULDER ARTHROPLASTY Right 08/31/2014   Procedure: TOTAL SHOULDER ARTHROPLASTY;  Surgeon: Eva Elsie Herring, MD;  Location: [facility] OR;  Service: Orthopedics;  Laterality: Right;  Right total shoulder arthroplasty   Patient Active Problem List   Diagnosis Date Noted   Osteoporosis 07/15/2024   Degenerative arthritis of left knee 03/17/2024   Dry skin 03/17/2024   COVID-19 04/23/2023   Spinal stenosis in cervical region 04/15/2023   Diarrhea 03/06/2023   Falls 03/06/2023   Fatigue 03/06/2023   Forgetfulness 03/06/2023   Light  headedness 03/06/2023   Mass of hard palate 03/06/2023   Hypokalemia 03/06/2023   Hypothyroidism 03/06/2023   Hyperlipidemia 03/06/2023   Hypocalcemia 03/06/2023   Contusion of chest 02/05/2023   Left foot drop 10/31/2022   Pill dysphagia 07/11/2022   Change in bowel habits 07/11/2022   History of colonic polyps 07/11/2022   GERD (gastroesophageal reflux disease) 11/21/2019   Atypical chest pain 11/21/2019   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   Essential hypertension 07/13/2018   Lumbar spondylosis 06/10/2018   Thoracic spinal stenosis 03/18/2018   Spinal stenosis of lumbar region 03/18/2018   Gait abnormality 02/04/2018   Paresthesia 02/04/2018   MGUS (monoclonal gammopathy of unknown significance) 12/02/2017   Status post total shoulder arthroplasty 08/31/2014    ONSET DATE: 06/13/2024 (referral date)  REFERRING DIAG: R26.9 (ICD-10-CM) - Gait abnormality R26.89 (ICD-10-CM) - Balance problem  THERAPY DIAG:  Unsteadiness on feet  Other abnormalities of gait and mobility  Muscle weakness (generalized)  Rationale for Evaluation and Treatment: Rehabilitation  SUBJECTIVE:  SUBJECTIVE STATEMENT:  Pt reports that she had two good days but doing worse today, mentally and physically. Pt denies any falls since last visit. She reports that her L hip felt looser after last visit and she was able to walk easier.  Pt accompanied by: self  PERTINENT HISTORY: PMH: HTN, Hypothyroidism, arthritis,spinal stenosis, L foot drop, neuropathy  PAIN:  Are you having pain? No pt denies pain today   PRECAUTIONS: Fall  RED FLAGS: None   WEIGHT BEARING RESTRICTIONS: No  FALLS: Has patient fallen in last 6 months? Yes. Number of falls see subjective from  eval  LIVING ENVIRONMENT: Lives with: lives alone Lives in: House/apartment Stairs: Yes: External: 4 steps; on right going up Has following equipment at home: stand alone cane  PLOF: Independent with gait, Independent with transfers, and Requires assistive device for independence  PATIENT GOALS: be able to walk better, toss my cane, but not sure that will ever happen stay out of a wheelchair or walker - does use walker occasionally - in the yard I would like to not have surgery - Dr. Claudene thinks she needs to have it  OBJECTIVE:  Note: Objective measures were completed at Evaluation unless otherwise noted.  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  COGNITION: Overall cognitive status: reports memory impairments for about a year   SENSATION: Neuropathy in BLE (L>R)  COORDINATION: Impaired in BLE  EDEMA:  Feet and ankles get swollen sometimes   POSTURE: rounded shoulders, forward head, increased thoracic kyphosis, and posterior pelvic tilt  LOWER EXTREMITY ROM:     Active  Right Eval Left Eval  Hip flexion    Hip extension    Hip abduction    Hip adduction    Hip internal rotation    Hip external rotation    Knee flexion    Knee extension    Ankle dorsiflexion Tight gastroc Tight gastroc  Ankle plantarflexion    Ankle inversion    Ankle eversion     (Blank rows = not tested)  LOWER EXTREMITY MMT:    MMT Right Eval Left Eval  Hip flexion 5 5  Hip extension    Hip abduction    Hip adduction    Hip internal rotation    Hip external rotation    Knee flexion 3 3  Knee extension 4 4  Ankle dorsiflexion 3- 2-  Ankle plantarflexion    Ankle inversion    Ankle eversion    (Blank rows = not tested)  BED MOBILITY:  Mod I via logroll per patient report  TRANSFERS: Sit to stand: SBA  Assistive device utilized: None      Stand to sit: SBA  Assistive device utilized: None     Chair to chair: SBA  Assistive device utilized: None       RAMP:  Not tested  CURB:  Not tested  STAIRS: Not tested GAIT: Findings:  Gait pattern: decreased hip/knee flexion- Left, decreased ankle dorsiflexion- Right, decreased ankle dorsiflexion- Left, poor foot clearance- Right, and poor foot clearance- Left Distance walked: various clinic distances Assistive device utilized: stand alone cane Level of assistance: Min A Comments: to be further assessed   FUNCTIONAL TESTS:     07/26/24 0001  Standardized Balance Assessment  Standardized Balance Assessment Berg Balance Test  Berg Balance Test  Sit to Stand 3 (doesn't use hands but very  unstead and often needs time to stabilize or falls backwards but doesn't use assistance)  Standing Unsupported 0 (Only able to stand once patient orients themselves or uses chair ot balance)  Sitting with Back Unsupported but Feet Supported on Floor or Stool 4  Stand to Sit 1  Transfers 2  Standing Unsupported with Eyes Closed 0 (Only able to stand once patient orients themselves or uses chair ot balance)  Standing Unsupported with Feet Together 1 (Only able to stand once patient orients themselves or uses chair ot balance)  From Standing, Reach Forward with Outstretched Arm 0 (Must hold on to chair)  From Standing Position, Pick up Object from Floor 0  From Standing Position, Turn to Look Behind Over each Shoulder 0 (Must hold on to chair)  Turn 360 Degrees 1  Standing Unsupported, Alternately Place Feet on Step/Stool 1  Standing Unsupported, One Foot in Front 1  Standing on One Leg 0  Total Score 14  Berg comment: High risk for falls                                                                                                                                  TREATMENT DATE:  09-01-24   TherAct: To work on stepping strategy and static and dynamic standing  balance: Stop/go with no AD but min HHA, 4 x 25 ft Difficulty with static stance and catching herself to stop without UE support Stop/go with stand alone cane and CGA, 4 x 25 ft Improved ability to maintain static stance as well as utilize stepping strategy for balance recovery Sit to stand with alt L/R forward stepping 2 x 5 reps Difficulty with initial standing balance (retropulsive) and maintaining static stance without UE support To work on proximal LE strengthening in half kneel on red mat on floor Half kneel position 3 x 30 sec L/R More difficulty balance on LLE as compared to RLE Half kneel position with 2# weighted dowel chest press 2 x 10 reps L/R Improved performance as task progresses  Gait: 115' x 2 reps (following half kneel tasks) with stand alone cane with CGA   PATIENT EDUCATION: Education details: continue HEP, continued to educate on use of AD at all times due to significant fall risk (preferably a RW, but pt does not want to use one) Person educated: Patient Education method: Explanation, Demonstration, Tactile cues, and Verbal cues Education comprehension: verbalized understanding, returned demonstration, and needs further education  HOME EXERCISE PROGRAM:  Access Code: [email or web address] URL: [email or web address] Date: 07/28/2024 Prepared by: Rock Kussmaul  Exercises - bolded exercises added to previous HEP - Seated March  - 1-2 x daily - 5 x weekly - 2 sets - 10 reps - Seated Heel Raise  - 1-2 x daily - 5 x weekly - 2 sets - 10 reps - Seated Toe Raise  - 1-2 x daily - 5 x weekly - 2 sets - 10 reps - Standing Hip  Extension with Counter Support  - 2 x daily - 7 x weekly - 1 sets - 10 reps - Standing Hip Abduction with Counter Support  - 2 x daily - 7 x weekly - 1 sets - 10 reps - Standing March with Counter Support  - 2 x daily - 7 x weekly - 10 reps - Single Leg Bridge  - 1 x daily - 7 x weekly - 3 sets - 10 reps - Clam with Resistance  - 1 x daily  - 7 x weekly - 3 sets - 10 reps   GOALS: Goals reviewed with patient? Yes  SHORT TERM GOALS: Target date: 08/11/2024  Pt will be independent with initial HEP for improved strength, balance, transfers and gait. Baseline: Goal status: Goal met - pt states she knows what she is supposed to do - 08-09-24  2.  Pt will improve gait velocity to at least 1.75 ft/sec for improved gait efficiency and performance at CGA level  Baseline: 1.66 ft/sec with stand alone cane min A (10/23); 15.78 secs = 2.08 ft/sec Goal status: Goal met 08-09-24  3.  Pt will improve 5 x STS to less than or equal to 25 seconds to demonstrate improved functional strength and transfer efficiency.  Baseline: 29.4 sec with no UE but posterior LOB and min A (10/23); 23.03 secs without UE support from mat - SBA Goal status: Goal met 08-09-24  4.  Pt will improve normal TUG to less than or equal to 16 seconds for improved functional mobility and decreased fall risk. Baseline: 19.35 sec with stand alone cane and min A (10/23); 18.31 secs with SPC - SBA  Goal status: Partially met 08-09-24  5.  Berg balance assessment will improve from 14/56 to at least 19/56 in order to demonstrate decreased risk of falls. Baseline: 14/56   14/56 (11/20) Goal status: NOT MET     LONG TERM GOALS: Target date: 09/08/2024   Pt will be independent with final HEP for improved strength, balance, transfers and gait. Baseline:  Goal status: INITIAL  2.  Pt will improve gait velocity to at least 2.0 ft/sec for improved gait efficiency and performance at SBA level  Baseline: 1.66 ft/sec with stand alone cane min A (10/23) Goal status: INITIAL  3.  Pt will improve 5 x STS to less than or equal to 21 seconds to demonstrate improved functional strength and transfer efficiency.  Baseline:  29.4 sec with no UE but posterior LOB and min A (10/23) Goal status: INITIAL  4.  Pt will improve normal TUG to less than or equal to 13 seconds for  improved functional mobility and decreased fall risk. Baseline: 19.35 sec with stand alone cane and min A (10/23) Goal status: INITIAL  5.  Berg balance assessment will improve to at least 24/56 in order to demonstrate decreased risk of falls. Baseline: 14/56 Goal status: INITIAL    ASSESSMENT:  CLINICAL IMPRESSION: Emphasis of skilled PT session on continuing to work on stepping strategy for balance recovery and fall prevention as well as working on proximal LE strengthening. Pt continues to benefit from use of her stand alone cane for standing balance as she is unable to balance independently without UE support. She also does well with half-kneel tasks this date and is challenged by them. She continues to benefit from skilled PT services to work towards LTGs. Continue POC.    OBJECTIVE IMPAIRMENTS: Abnormal gait, decreased balance, decreased cognition, decreased coordination, difficulty walking, decreased ROM, decreased strength, impaired  perceived functional ability, impaired sensation, improper body mechanics, postural dysfunction, and pain.   ACTIVITY LIMITATIONS: carrying, lifting, bending, standing, stairs, transfers, bed mobility, and locomotion level  PARTICIPATION LIMITATIONS: meal prep, cleaning, laundry, driving, community activity, and yard work  PERSONAL FACTORS: Age, Time since onset of injury/illness/exacerbation, and 3+ comorbidities:   PMH: HTN, Hypothyroidism, arthritis,spinal stenosis, L foot drop, neuropathyare also affecting patient's functional outcome.   REHAB POTENTIAL: Fair has tried PT in the past, buy-in regarding AD recommendations, age, cervical spinal stenosis, neuropathy, foot drop  CLINICAL DECISION MAKING: Unstable/unpredictable  EVALUATION COMPLEXITY: High  PLAN:  PT FREQUENCY: 2x/week  PT DURATION: 8 weeks  PLANNED INTERVENTIONS: [id number]- PT Re-evaluation, 97750- Physical Performance Testing, 97110-Therapeutic exercises, 97530- Therapeutic  activity, [id number]- Neuromuscular re-education, 97535- Self Care, [id number]- Manual therapy, [id number]- Gait training, [phone]- Orthotic Initial, [phone]- Orthotic/Prosthetic subsequent, [phone]- Aquatic Therapy, [phone]- Electrical stimulation (manual), [phone] (1-2 muscles), [id number] (3+ muscles)- Dry Needling, Patient/Family education, Balance training, Stair training, Taping, Joint mobilization, Spinal mobilization, Cognitive remediation, DME instructions, Cryotherapy, and Moist heat  PLAN FOR NEXT SESSION: leg press for quad strengthening  work on LE placement and anterior weight shift with sit to stands, toe taps in standing, quad and gluteal strengthening in standing and seated/supine. Add to HEP accordingly (I.e. seated and supine until safe to perform standing at home), SLS, eccentric step downs, anterior weight shift with sit to stand, gumdrop taps, stop/go, stepping strategy, ankle strategy; bring up discussion of d/c   Waddell Southgate, PT, DPT, CSRS   09/01/2024 11:46 AM

## 2024-09-02 DIAGNOSIS — D472 Monoclonal gammopathy: Secondary | ICD-10-CM | POA: Diagnosis not present

## 2024-09-02 LAB — MULTIPLE MYELOMA PANEL, SERUM
Albumin SerPl Elph-Mcnc: 3.6 g/dL (ref 2.9–4.4)
Albumin/Glob SerPl: 1.1 (ref 0.7–1.7)
Alpha 1: 0.2 g/dL (ref 0.0–0.4)
Alpha2 Glob SerPl Elph-Mcnc: 0.8 g/dL (ref 0.4–1.0)
B-Globulin SerPl Elph-Mcnc: 0.9 g/dL (ref 0.7–1.3)
Gamma Glob SerPl Elph-Mcnc: 1.7 g/dL (ref 0.4–1.8)
Globulin, Total: 3.6 g/dL (ref 2.2–3.9)
IgA: 93 mg/dL (ref 64–422)
IgG (Immunoglobin G), Serum: 1793 mg/dL — ABNORMAL HIGH (ref 586–1602)
IgM (Immunoglobulin M), Srm: 60 mg/dL (ref 26–217)
M Protein SerPl Elph-Mcnc: 1.1 g/dL — ABNORMAL HIGH
Total Protein ELP: 7.2 g/dL (ref 6.0–8.5)

## 2024-09-04 NOTE — Progress Notes (Signed)
 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 Federico   CHIEF COMPLAINTS/PURPOSE OF CONSULTATION:  IgG Lambda MGUS   HISTORY OF PRESENTING ILLNESS:  Carol Woods Finger 89 y.o. female with medical history significant for arthritis, hypertension, GERD, high cholesterol, hypothyroidism, who presents to establish care for her MGUS.  On review of the previous records Ms. Alaimo was previously seen in our clinic by Dr. Cala.  She was last seen in 2019 and then by Dr. Layla in 2020.  Unfortunately she was lost to follow-up after that time.  Due to her concern for history of MGUS she was referred to our clinic to reestablish care.  On exam today Ms. Holsonback reports that she does not have any bone or back pain.  She reports that she does have some occasional issues with muscle pain and neuropathy in her left foot.  She reports that she feels like her energy has been awful over the last year.  She has not noticed any urinary changes such as bubbling or foaming of the urine.  On further discussion she reports that her daughter recently died of a plasma cell leukemia.  She reports that she also lost her mother to breast cancer and her brother to prostate cancer.  She reports that she is currently divorced but has 4 grandchildren.  She reports that she is a former smoker having quit approximately 50 years ago.  She rarely drinks alcohol .  Otherwise she has been her baseline level of health with no recent fevers, chills, sweats, nausea, vomiting or  diarrhea.  Full 10 point ROS is otherwise negative.  MEDICAL HISTORY:  Past Medical History:  Diagnosis Date   Arthritis    back; shoulders (08/31/2014)   Atypical chest pain 11/21/2019   Essential hypertension 07/13/2018   GERD (gastroesophageal reflux disease)    OTC, changed diet   High cholesterol    History of cataract surgery 02/05/2015   2nd eye done 07/23/2015   Hypertension    Hypothyroidism     SURGICAL HISTORY: Past Surgical History:  Procedure Laterality Date   ANTERIOR CERVICAL DECOMP/DISCECTOMY FUSION  02/2010   Dr Colon   BACK SURGERY     COLONOSCOPY W/ POLYPECTOMY     DILATION AND CURETTAGE OF UTERUS     POSTERIOR LUMBAR FUSION  04/2010   Dr Colon   TONSILLECTOMY     age 44   TOTAL SHOULDER ARTHROPLASTY Right 08/31/2014   TOTAL SHOULDER ARTHROPLASTY Right 08/31/2014   Procedure: TOTAL SHOULDER ARTHROPLASTY;  Surgeon: Eva Elsie Herring, MD;  Location: [facility] OR;  Service: Orthopedics;  Laterality: Right;  Right total shoulder arthroplasty    SOCIAL HISTORY: Social History   Socioeconomic History   Marital status: Divorced    Spouse name: Not on file   Number of children: Not on file   Years of education: Not on file   Highest education level: Master's degree (e.g., MA, MS, MEng, MEd, MSW, MBA)  Occupational History   Occupation: RETIRED  Tobacco Use   Smoking status: Former    Current packs/day: 0.00    Average packs/day: 1.5 packs/day for 25.0 years (37.5 ttl pk-yrs)  Types: Cigarettes    Start date: 08/23/1959    Quit date: 08/22/1984    Years since quitting: 40.0   Smokeless tobacco: Never  Vaping Use   Vaping status: Never Used  Substance and Sexual Activity   Alcohol  use: Not Currently    Comment: occasionally/ wine and mixed drinks   Drug use: No   Sexual activity: Not on file  Other Topics Concern   Not on file  Social History Narrative   Lives alone and has a cat/2025   Social Drivers of Health   Tobacco Use: Medium Risk  (08/30/2024)   Patient History    Smoking Tobacco Use: Former    Smokeless Tobacco Use: Never    Passive Exposure: Not on Actuary Strain: Low Risk (04/28/2024)   Overall Financial Resource Strain (CARDIA)    Difficulty of Paying Living Expenses: Not hard at all  Food Insecurity: Unknown (08/31/2024)   Epic    Worried About Programme Researcher, Broadcasting/film/video in the Last Year: Never true    [employer] of Food in the Last Year: Not on file  Transportation Needs: No Transportation Needs (08/31/2024)   Epic    Lack of Transportation (Medical): No    Lack of Transportation (Non-Medical): No  Physical Activity: Inactive (04/28/2024)   Exercise Vital Sign    Days of Exercise per Week: 0 days    Minutes of Exercise per Session: Not on file  Stress: Stress Concern Present (04/29/2024)   [employer] of Occupational Health - Occupational Stress Questionnaire    Feeling of Stress: Rather much  Social Connections: Unknown (04/28/2024)   Social Connection and Isolation Panel    Frequency of Communication with Friends and Family: Not on file    Frequency of Social Gatherings with Friends and Family: Not on file    Attends Religious Services: More than 4 times per year    Active Member of Clubs or Organizations: Yes    Attends Banker Meetings: More than 4 times per year    Marital Status: Divorced  Intimate Partner Violence: Not At Risk (08/31/2024)   Epic    Fear of Current or Ex-Partner: No    Emotionally Abused: No    Physically Abused: No    Sexually Abused: No  Depression (PHQ2-9): Low Risk (08/31/2024)   Depression (PHQ2-9)    PHQ-2 Score: 1  Alcohol  Screen: Low Risk (04/28/2024)   Alcohol  Screen    Last Alcohol  Screening Score (AUDIT): 1  Housing: Unknown (08/31/2024)   Epic    Unable to Pay for Housing in the Last Year: No    Number of Times Moved in the Last Year: Not on file    Homeless in the Last Year: No  Utilities: Not At Risk (08/31/2024)   Epic    Threatened  with loss of utilities: No  Health Literacy: Adequate Health Literacy (04/29/2024)   B1300 Health Literacy    Frequency of need for help with medical instructions: Never    FAMILY HISTORY: Family History  Problem Relation Age of Onset   Breast cancer Mother    Ovarian cancer Mother    Alzheimer's disease Mother    Parkinson's disease Father    Heart disease Father    Hypothyroidism Father    Hypertension Sister    Breast cancer Sister    Kidney disease Sister    Prostate cancer Brother    Heart attack Cousin    Colon cancer Neg Hx    Esophageal  cancer Neg Hx    Inflammatory bowel disease Neg Hx    Liver disease Neg Hx    Pancreatic cancer Neg Hx    Rectal cancer Neg Hx    Stomach cancer Neg Hx     ALLERGIES:  has no known allergies.  MEDICATIONS:  Current Outpatient Medications  Medication Sig Dispense Refill   alendronate (FOSAMAX) 70 MG tablet Take 1 tablet po q weekly as directed Oral     amLODipine  (NORVASC ) 2.5 MG tablet Take 3 tablets (7.5 mg total) by mouth daily. 90 tablet 6   b complex vitamins capsule Take 1 capsule by mouth daily.     CALCIUM PO Take 1 tablet by mouth daily.     Cholecalciferol (VITAMIN D  PO) Take 1 tablet by mouth daily.     clindamycin (CLEOCIN T) 1 % external solution Apply 1 application topically daily.      cyanocobalamin  1000 MCG tablet Take 1,000 mcg by mouth daily.     hydrocortisone 2.5 % cream Apply 1 Application topically daily.     latanoprost (XALATAN) 0.005 % ophthalmic solution Place 1 drop into both eyes at bedtime.     levothyroxine  (SYNTHROID ) 125 MCG tablet Take 1 tablet (125 mcg total) by mouth daily. 90 tablet 3   lisinopril  (ZESTRIL ) 20 MG tablet Take 1 tablet (20 mg total) by mouth daily. 90 tablet 3   Magnesium 500 MG CAPS Take 500 mg by mouth daily.     Multiple Vitamins-Minerals (PRESERVISION AREDS PO)      Omega-3 Fatty Acids (FISH OIL PO) Take 1 capsule by mouth daily.     PARoxetine (PAXIL) 10 MG tablet Take 10 mg  by mouth daily.     potassium chloride  SA (KLOR-CON  M) 20 MEQ tablet Take 1 tablet (20 mEq total) by mouth daily. 30 tablet 3   triamcinolone  cream (KENALOG ) 0.5 % Apply 1 Application topically 2 (two) times daily. To affected areas. 30 g 3   No current facility-administered medications for this visit.    REVIEW OF SYSTEMS:   Constitutional: ( - ) fevers, ( - )  chills , ( - ) night sweats Eyes: ( - ) blurriness of vision, ( - ) double vision, ( - ) watery eyes Ears, nose, mouth, throat, and face: ( - ) mucositis, ( - ) sore throat Respiratory: ( - ) cough, ( - ) dyspnea, ( - ) wheezes Cardiovascular: ( - ) palpitation, ( - ) chest discomfort, ( - ) lower extremity swelling Gastrointestinal:  ( - ) nausea, ( - ) heartburn, ( - ) change in bowel habits Skin: ( - ) abnormal skin rashes Lymphatics: ( - ) new lymphadenopathy, ( - ) easy bruising Neurological: ( - ) numbness, ( - ) tingling, ( - ) new weaknesses Behavioral/Psych: ( - ) mood change, ( - ) new changes  All other systems were reviewed with the patient and are negative.  PHYSICAL EXAMINATION:  Vitals:   08/31/24 0944 08/31/24 0945  BP: (!) 166/69 (!) 149/59  Pulse: 73   Resp: 15   Temp: 98.2 F (36.8 C)   SpO2: 96%    Filed Weights   08/31/24 0944  Weight: 121 lb 4.8 oz (55 kg)    GENERAL: well appearing elderly Caucasian female in NAD  SKIN: skin color, texture, turgor are normal, no rashes or significant lesions EYES: conjunctiva are pink and non-injected, sclera clear LUNGS: clear to auscultation and percussion with normal breathing effort HEART: regular rate &  rhythm and no murmurs and no lower extremity edema Musculoskeletal: no cyanosis of digits and no clubbing  PSYCH: alert & oriented x 3, fluent speech NEURO: no focal motor/sensory deficits  LABORATORY DATA:  I have reviewed the data as listed    Latest Ref Rng & Units 08/31/2024   10:25 AM 07/15/2024   10:18 AM 05/30/2024   11:35 AM  CBC  WBC 4.0 -  10.5 K/uL 7.3  9.0  7.3   Hemoglobin 12.0 - 15.0 g/dL 87.3  87.1  87.1   Hematocrit 36.0 - 46.0 % 36.8  37.8  37.4   Platelets 150 - 400 K/uL 215  213.0  221.0        Latest Ref Rng & Units 08/31/2024   10:25 AM 07/28/2024    4:32 PM 07/15/2024   10:18 AM  CMP  Glucose 70 - 99 mg/dL 99  876  97   BUN 8 - 23 mg/dL 20  22  14    Creatinine 0.44 - 1.00 mg/dL 9.38  9.31  9.48   Sodium 135 - 145 mmol/L 142  140  142   Potassium 3.5 - 5.1 mmol/L 3.7  2.9  3.1   Chloride 98 - 111 mmol/L 106  100  100   CO2 22 - 32 mmol/L 28  33  33   Calcium 8.9 - 10.3 mg/dL 9.8  9.9  9.6   Total Protein 6.5 - 8.1 g/dL 8.0   7.9   Total Bilirubin 0.0 - 1.2 mg/dL 0.5   0.7   Alkaline Phos 38 - 126 U/L 65   57   AST 15 - 41 U/L 18   15   ALT 0 - 44 U/L 12   11      ASSESSMENT & PLAN SAFIRE GORDIN 89 y.o. female with medical history significant for arthritis, hypertension, GERD, high cholesterol, hypothyroidism, who presents to establish care for her MGUS.  After review of the labs, review of the records, and discussion with the patient the patients findings are most consistent with longstanding MGUS.  She was previously followed by our clinic by Dr. Cala and Dr. Layla but was lost to follow-up in 2020.  Monoclonal Gammopathies are a group of medical conditions defined by the presence of a monoclonal protein (an M protein) in the blood or urine. Monoclonal gammopathies include monoclonal gammopathy of unknown significance (MGUS), Monoclonal gammopathies of renal or neurological significance,  smoldering multiple myeloma (SMM), multiple myeloma (MM), AL amyloidosis, and Waldenstrom macroglobulinemia. The goal of the initial workup is to determine which monoclonal gammopathy a patient has. The workup consists of evaluating protein in the serum (with serum protein electrophoresis (SPEP) and serum free light chains) , evaluating protein in the urine (UPEP), and evaluation of the skeleton (DG Bone Met Survey)  to assure no lytic lesions. Baseline bloodwork includes CMP and CBC. If no CRAB criteria or high risk criteria are noted then the diagnosis is MGUS. MGUS must be followed with bloodwork periodically to assure it does not convert to multiple myeloma (occurs to approximately 1% of patients per year). If there are CRAB criteria or high risk features (such as elevated serum free light chain ratio (taking into account renal function), a non IgG M protein, or M protein >1.5) then a bone marrow biopsy must be pursued.    #IgG Lambda Monoclonal Gammopathy of Undetermined Significance --today will order an SPEP, UPEP, SFLC and beta 2 microglobulin --additionally will collect new baseline CBC, CMP, and LDH --  recommend a metastatic bone survey to assess for lytic lesions --will consider the need for a bone marrow biopsy pending the above results --RTC in 12 months or sooner if intervention is required.    Orders Placed This Encounter  Procedures   DG Bone Survey Met    Standing Status:   Future    Expected Date:   09/07/2024    Expiration Date:   08/31/2025    Reason for Exam (SYMPTOM  OR DIAGNOSIS REQUIRED):   MGUS, assess for lytic lesions    Preferred imaging location?:   [facility]   CBC with Differential (Cancer Center Only)    Standing Status:   Future    Number of Occurrences:   1    Expiration Date:   08/31/2025   CMP (Cancer Center only)    Standing Status:   Future    Number of Occurrences:   1    Expiration Date:   08/31/2025   Lactate dehydrogenase (LDH)    Standing Status:   Future    Number of Occurrences:   1    Expiration Date:   08/31/2025   Multiple Myeloma Panel (SPEP&IFE w/QIG)    Standing Status:   Future    Number of Occurrences:   1    Expiration Date:   08/31/2025   Kappa/lambda light chains    Standing Status:   Future    Number of Occurrences:   1    Expiration Date:   08/31/2025   Beta 2 microglobulin    Standing Status:   Future    Number of  Occurrences:   1    Expiration Date:   08/31/2025   24-Hr Ur UPEP/UIFE/Light Chains/TP    Standing Status:   Future    Number of Occurrences:   1    Expiration Date:   08/31/2025    All questions were answered. The patient knows to call the clinic with any problems, questions or concerns.  A total of more than 60 minutes were spent on this encounter with face-to-face time and non-face-to-face time, including preparing to see the patient, ordering tests and/or medications, counseling the patient and coordination of care as outlined above.   Norleen IVAR Kidney, MD Department of Hematology/Oncology [facility] Phone: [phone] Pager: [phone] Email: [email or web address]  09/04/2024 7:26 PM

## 2024-09-05 ENCOUNTER — Inpatient Hospital Stay (HOSPITAL_COMMUNITY): Admission: RE | Admit: 2024-09-05 | Discharge: 2024-09-05 | Attending: Hematology and Oncology

## 2024-09-05 DIAGNOSIS — D472 Monoclonal gammopathy: Secondary | ICD-10-CM

## 2024-09-06 ENCOUNTER — Ambulatory Visit: Admitting: Physical Therapy

## 2024-09-06 DIAGNOSIS — R2681 Unsteadiness on feet: Secondary | ICD-10-CM

## 2024-09-06 DIAGNOSIS — M6281 Muscle weakness (generalized): Secondary | ICD-10-CM

## 2024-09-06 DIAGNOSIS — R2689 Other abnormalities of gait and mobility: Secondary | ICD-10-CM

## 2024-09-06 NOTE — Therapy (Signed)
 OUTPATIENT PHYSICAL THERAPY NEURO TREATMENT       Patient Name: Carol Woods MRN: [id number] DOB:Mar 02, 1934, 89 y.o., female Today's Date: 09/06/2024   PCP: Elnor Lauraine FORBES, NP REFERRING PROVIDER: Claudene Arthea HERO, DO  END OF SESSION:  PT End of Session - 09/06/24 1109     Visit Number 12    Number of Visits 17   with eval   Date for Recertification  09/22/24    Authorization Type [employer]    PT Start Time 1105    PT Stop Time 1149    PT Time Calculation (min) 44 min    Equipment Utilized During Treatment Gait belt    Activity Tolerance Patient tolerated treatment well    Behavior During Therapy WFL for tasks assessed/performed                   Past Medical History:  Diagnosis Date   Arthritis    back; shoulders (08/31/2014)   Atypical chest pain 11/21/2019   Essential hypertension 07/13/2018   GERD (gastroesophageal reflux disease)    OTC, changed diet   High cholesterol    History of cataract surgery 02/05/2015   2nd eye done 07/23/2015   Hypertension    Hypothyroidism    Past Surgical History:  Procedure Laterality Date   ANTERIOR CERVICAL DECOMP/DISCECTOMY FUSION  02/2010   Dr Colon   BACK SURGERY     COLONOSCOPY W/ POLYPECTOMY     DILATION AND CURETTAGE OF UTERUS     POSTERIOR LUMBAR FUSION  04/2010   Dr Colon   TONSILLECTOMY     age 76   TOTAL SHOULDER ARTHROPLASTY Right 08/31/2014   TOTAL SHOULDER ARTHROPLASTY Right 08/31/2014   Procedure: TOTAL SHOULDER ARTHROPLASTY;  Surgeon: Eva Elsie Herring, MD;  Location: [facility] OR;  Service: Orthopedics;  Laterality: Right;  Right total shoulder arthroplasty   Patient Active Problem List   Diagnosis Date Noted   Osteoporosis 07/15/2024   Degenerative arthritis of left knee 03/17/2024   Dry skin 03/17/2024   COVID-19 04/23/2023   Spinal stenosis in cervical region 04/15/2023   Diarrhea 03/06/2023   Falls 03/06/2023   Fatigue 03/06/2023   Forgetfulness 03/06/2023   Light  headedness 03/06/2023   Mass of hard palate 03/06/2023   Hypokalemia 03/06/2023   Hypothyroidism 03/06/2023   Hyperlipidemia 03/06/2023   Hypocalcemia 03/06/2023   Contusion of chest 02/05/2023   Left foot drop 10/31/2022   Pill dysphagia 07/11/2022   Change in bowel habits 07/11/2022   History of colonic polyps 07/11/2022   GERD (gastroesophageal reflux disease) 11/21/2019   Atypical chest pain 11/21/2019   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   Essential hypertension 07/13/2018   Lumbar spondylosis 06/10/2018   Thoracic spinal stenosis 03/18/2018   Spinal stenosis of lumbar region 03/18/2018   Gait abnormality 02/04/2018   Paresthesia 02/04/2018   MGUS (monoclonal gammopathy of unknown significance) 12/02/2017   Status post total shoulder arthroplasty 08/31/2014    ONSET DATE: 06/13/2024 (referral date)  REFERRING DIAG: R26.9 (ICD-10-CM) - Gait abnormality R26.89 (ICD-10-CM) - Balance problem  THERAPY DIAG:  Unsteadiness on feet  Other abnormalities of gait and mobility  Muscle weakness (generalized)  Rationale for Evaluation and Treatment: Rehabilitation  SUBJECTIVE:  SUBJECTIVE STATEMENT:  Pt denies any falls since last visit but says she has had two flips where she turned her head while walking and lost her balance and fell into a chair but she doesn't count that as a fall; turning motion set off her LOB.  Pt accompanied by: self  PERTINENT HISTORY: PMH: HTN, Hypothyroidism, arthritis,spinal stenosis, L foot drop, neuropathy  PAIN:  Are you having pain? No pt denies pain today   PRECAUTIONS: Fall  RED FLAGS: None   WEIGHT BEARING RESTRICTIONS: No  FALLS: Has patient fallen in last 6 months? Yes. Number of falls see subjective from  eval  LIVING ENVIRONMENT: Lives with: lives alone Lives in: House/apartment Stairs: Yes: External: 4 steps; on right going up Has following equipment at home: stand alone cane  PLOF: Independent with gait, Independent with transfers, and Requires assistive device for independence  PATIENT GOALS: be able to walk better, toss my cane, but not sure that will ever happen stay out of a wheelchair or walker - does use walker occasionally - in the yard I would like to not have surgery - Dr. Claudene thinks she needs to have it  OBJECTIVE:  Note: Objective measures were completed at Evaluation unless otherwise noted.  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  COGNITION: Overall cognitive status: reports memory impairments for about a year   SENSATION: Neuropathy in BLE (L>R)  COORDINATION: Impaired in BLE  EDEMA:  Feet and ankles get swollen sometimes   POSTURE: rounded shoulders, forward head, increased thoracic kyphosis, and posterior pelvic tilt  LOWER EXTREMITY ROM:     Active  Right Eval Left Eval  Hip flexion    Hip extension    Hip abduction    Hip adduction    Hip internal rotation    Hip external rotation    Knee flexion    Knee extension    Ankle dorsiflexion Tight gastroc Tight gastroc  Ankle plantarflexion    Ankle inversion    Ankle eversion     (Blank rows = not tested)  LOWER EXTREMITY MMT:    MMT Right Eval Left Eval  Hip flexion 5 5  Hip extension    Hip abduction    Hip adduction    Hip internal rotation    Hip external rotation    Knee flexion 3 3  Knee extension 4 4  Ankle dorsiflexion 3- 2-  Ankle plantarflexion    Ankle inversion    Ankle eversion    (Blank rows = not tested)  BED MOBILITY:  Mod I via logroll per patient report  TRANSFERS: Sit to stand: SBA  Assistive device utilized: None      Stand to sit: SBA  Assistive device utilized: None     Chair to chair: SBA  Assistive device utilized: None       RAMP:  Not tested  CURB:  Not tested  STAIRS: Not tested GAIT: Findings:  Gait pattern: decreased hip/knee flexion- Left, decreased ankle dorsiflexion- Right, decreased ankle dorsiflexion- Left, poor foot clearance- Right, and poor foot clearance- Left Distance walked: various clinic distances Assistive device utilized: stand alone cane Level of assistance: Min A Comments: to be further assessed   FUNCTIONAL TESTS:     07/26/24 0001  Standardized Balance Assessment  Standardized Balance Assessment Berg Balance Test  Berg Balance Test  Sit to Stand 3 (doesn't  use hands but very unstead and often needs time to stabilize or falls backwards but doesn't use assistance)  Standing Unsupported 0 (Only able to stand once patient orients themselves or uses chair ot balance)  Sitting with Back Unsupported but Feet Supported on Floor or Stool 4  Stand to Sit 1  Transfers 2  Standing Unsupported with Eyes Closed 0 (Only able to stand once patient orients themselves or uses chair ot balance)  Standing Unsupported with Feet Together 1 (Only able to stand once patient orients themselves or uses chair ot balance)  From Standing, Reach Forward with Outstretched Arm 0 (Must hold on to chair)  From Standing Position, Pick up Object from Floor 0  From Standing Position, Turn to Look Behind Over each Shoulder 0 (Must hold on to chair)  Turn 360 Degrees 1  Standing Unsupported, Alternately Place Feet on Step/Stool 1  Standing Unsupported, One Foot in Front 1  Standing on One Leg 0  Total Score 14  Berg comment: High risk for falls                                                                                                                                  TREATMENT DATE:  09-06-24   TherAct: To work on editor, commissioning with horizontal head turns and balance  recovery: Gait x 115 ft with v/c for L and R head turns Several mild LOB but pt able to recover, min A Gait x 230 ft with head turns L/R to find cards and name suits Very unsteady, with onset of fatigue decreased speed noted Min to mod A needed for balance and to prevent a fall To work on proximal BLE strengthening in half kneel position on red mat on floor 2# weighted dowel chest press 2 x 10 reps in L/R position 2# weighted dowel OH press 2 x 10 reps in L/R position  Gait: 115' x 2 reps (following half kneel tasks) with stand alone cane with CGA   PATIENT EDUCATION: Education details: continue HEP, continued to educate on use of AD at all times due to significant fall risk (preferably a RW, but pt does not want to use one) and that flipping into the chair counts as a fall Person educated: Patient Education method: Explanation, Demonstration, Tactile cues, and Verbal cues Education comprehension: verbalized understanding, returned demonstration, and needs further education  HOME EXERCISE PROGRAM:  Access Code: [email or web address] URL: [email or web address] Date: 07/28/2024 Prepared by: Rock Kussmaul  Exercises - bolded exercises added to previous HEP - Seated March  - 1-2 x daily - 5 x weekly - 2 sets - 10 reps - Seated Heel Raise  - 1-2 x daily - 5 x weekly - 2 sets - 10 reps - Seated Toe Raise  - 1-2 x daily - 5 x weekly - 2 sets - 10 reps - Standing Hip Extension with Counter Support  - 2 x daily - 7 x weekly -  1 sets - 10 reps - Standing Hip Abduction with Counter Support  - 2 x daily - 7 x weekly - 1 sets - 10 reps - Standing March with Counter Support  - 2 x daily - 7 x weekly - 10 reps - Single Leg Bridge  - 1 x daily - 7 x weekly - 3 sets - 10 reps - Clam with Resistance  - 1 x daily - 7 x weekly - 3 sets - 10 reps   GOALS: Goals reviewed with patient? Yes  SHORT TERM GOALS: Target date: 08/11/2024  Pt will be independent with initial HEP for improved  strength, balance, transfers and gait. Baseline: Goal status: Goal met - pt states she knows what she is supposed to do - 08-09-24  2.  Pt will improve gait velocity to at least 1.75 ft/sec for improved gait efficiency and performance at CGA level  Baseline: 1.66 ft/sec with stand alone cane min A (10/23); 15.78 secs = 2.08 ft/sec Goal status: Goal met 08-09-24  3.  Pt will improve 5 x STS to less than or equal to 25 seconds to demonstrate improved functional strength and transfer efficiency.  Baseline: 29.4 sec with no UE but posterior LOB and min A (10/23); 23.03 secs without UE support from mat - SBA Goal status: Goal met 08-09-24  4.  Pt will improve normal TUG to less than or equal to 16 seconds for improved functional mobility and decreased fall risk. Baseline: 19.35 sec with stand alone cane and min A (10/23); 18.31 secs with SPC - SBA  Goal status: Partially met 08-09-24  5.  Berg balance assessment will improve from 14/56 to at least 19/56 in order to demonstrate decreased risk of falls. Baseline: 14/56   14/56 (11/20) Goal status: NOT MET     LONG TERM GOALS: Target date: 09/08/2024   Pt will be independent with final HEP for improved strength, balance, transfers and gait. Baseline:  Goal status: INITIAL  2.  Pt will improve gait velocity to at least 2.0 ft/sec for improved gait efficiency and performance at SBA level  Baseline: 1.66 ft/sec with stand alone cane min A (10/23) Goal status: INITIAL  3.  Pt will improve 5 x STS to less than or equal to 21 seconds to demonstrate improved functional strength and transfer efficiency.  Baseline:  29.4 sec with no UE but posterior LOB and min A (10/23) Goal status: INITIAL  4.  Pt will improve normal TUG to less than or equal to 13 seconds for improved functional mobility and decreased fall risk. Baseline: 19.35 sec with stand alone cane and min A (10/23) Goal status: INITIAL  5.  Berg balance assessment will improve to  at least 24/56 in order to demonstrate decreased risk of falls. Baseline: 14/56 Goal status: INITIAL    ASSESSMENT:  CLINICAL IMPRESSION: Emphasis of skilled PT session on working on dynamic balance with horizontal head turns and proximal BLE strengthening. Pt continues to exhibit poor standing balance with inability to maintain static standing balance without UE support and several LOB when performing gait with horizontal head turns finding targets. She also exhibits poor ability to recover her balance once she loses it due to BLE neuropathy and B foot drop. She remains resistant to education regarding a more appropriate AD for her current level of function. She continues to benefit from skilled PT services to work towards LTGs. Continue POC.    OBJECTIVE IMPAIRMENTS: Abnormal gait, decreased balance, decreased cognition, decreased coordination, difficulty  walking, decreased ROM, decreased strength, impaired perceived functional ability, impaired sensation, improper body mechanics, postural dysfunction, and pain.   ACTIVITY LIMITATIONS: carrying, lifting, bending, standing, stairs, transfers, bed mobility, and locomotion level  PARTICIPATION LIMITATIONS: meal prep, cleaning, laundry, driving, community activity, and yard work  PERSONAL FACTORS: Age, Time since onset of injury/illness/exacerbation, and 3+ comorbidities:   PMH: HTN, Hypothyroidism, arthritis,spinal stenosis, L foot drop, neuropathyare also affecting patient's functional outcome.   REHAB POTENTIAL: Fair has tried PT in the past, buy-in regarding AD recommendations, age, cervical spinal stenosis, neuropathy, foot drop  CLINICAL DECISION MAKING: Unstable/unpredictable  EVALUATION COMPLEXITY: High  PLAN:  PT FREQUENCY: 2x/week  PT DURATION: 8 weeks  PLANNED INTERVENTIONS: 97164- PT Re-evaluation, 97750- Physical Performance Testing, 97110-Therapeutic exercises, 97530- Therapeutic activity, [id number]- Neuromuscular  re-education, 97535- Self Care, [id number]- Manual therapy, [id number]- Gait training, [phone]- Orthotic Initial, [phone]- Orthotic/Prosthetic subsequent, [phone]- Aquatic Therapy, [phone]- Electrical stimulation (manual), [phone] (1-2 muscles), [id number] (3+ muscles)- Dry Needling, Patient/Family education, Balance training, Stair training, Taping, Joint mobilization, Spinal mobilization, Cognitive remediation, DME instructions, Cryotherapy, and Moist heat  PLAN FOR NEXT SESSION: check one LTG and update goal date to match last scheduled appt, leg press for quad strengthening, work on LE placement and anterior weight shift with sit to stands, toe taps in standing, quad and gluteal strengthening in standing and seated/supine. Add to HEP accordingly (I.e. seated and supine until safe to perform standing at home), SLS, eccentric step downs, anterior weight shift with sit to stand, gumdrop taps, stop/go, stepping strategy, ankle strategy;   May be a little late to appt 12/18 as she has a hair appt right before   Waddell [place]gate, PT, DPT, CSRS   09/06/2024 11:49 AM

## 2024-09-08 ENCOUNTER — Ambulatory Visit: Admitting: Physical Therapy

## 2024-09-08 ENCOUNTER — Encounter: Payer: Self-pay | Admitting: Physical Therapy

## 2024-09-08 DIAGNOSIS — R2681 Unsteadiness on feet: Secondary | ICD-10-CM | POA: Diagnosis not present

## 2024-09-08 DIAGNOSIS — M6281 Muscle weakness (generalized): Secondary | ICD-10-CM

## 2024-09-08 DIAGNOSIS — R2689 Other abnormalities of gait and mobility: Secondary | ICD-10-CM

## 2024-09-08 LAB — UPEP/UIFE/LIGHT CHAINS/TP, 24-HR UR
% BETA, Urine: 25 %
ALPHA 1 URINE: 3.8 %
Albumin, U: 39.1 %
Alpha 2, Urine: 12.2 %
Free Kappa Lt Chains,Ur: 56.41 mg/L (ref 1.17–86.46)
Free Kappa/Lambda Ratio: 3.92 (ref 1.83–14.26)
Free Lambda Lt Chains,Ur: 14.39 mg/L (ref 0.27–15.21)
GAMMA GLOBULIN URINE: 19.9 %
Total Protein, Urine-Ur/day: 100 mg/(24.h) (ref 30–150)
Total Protein, Urine: 8 mg/dL
Total Volume: 1250

## 2024-09-08 NOTE — Therapy (Signed)
 OUTPATIENT PHYSICAL THERAPY NEURO TREATMENT       Patient Name: Carol Woods MRN: [id number] DOB:[birth year], 89 y.o., female Today's Date: 09/08/2024   PCP: Elnor Lauraine FORBES, NP REFERRING PROVIDER: Claudene Arthea HERO, DO  END OF SESSION:  PT End of Session - 09/08/24 1107     Visit Number 13    Number of Visits 17   with eval   Date for Recertification  09/22/24    Authorization Type [employer]    PT Start Time 1105   pt late to session   PT Stop Time 1145    PT Time Calculation (min) 40 min    Equipment Utilized During Treatment Gait belt    Activity Tolerance Patient tolerated treatment well    Behavior During Therapy [facility] for tasks assessed/performed                   Past Medical History:  Diagnosis Date   Arthritis    back; shoulders (08/31/2014)   Atypical chest pain 11/21/2019   Essential hypertension 07/13/2018   GERD (gastroesophageal reflux disease)    OTC, changed diet   High cholesterol    History of cataract surgery 02/05/2015   2nd eye done 07/23/2015   Hypertension    Hypothyroidism    Past Surgical History:  Procedure Laterality Date   ANTERIOR CERVICAL DECOMP/DISCECTOMY FUSION  02/2010   Dr Colon   BACK SURGERY     COLONOSCOPY W/ POLYPECTOMY     DILATION AND CURETTAGE OF UTERUS     POSTERIOR LUMBAR FUSION  04/2010   Dr Colon   TONSILLECTOMY     age 40   TOTAL SHOULDER ARTHROPLASTY Right 08/31/2014   TOTAL SHOULDER ARTHROPLASTY Right 08/31/2014   Procedure: TOTAL SHOULDER ARTHROPLASTY;  Surgeon: Eva Elsie Herring, MD;  Location: [facility] OR;  Service: Orthopedics;  Laterality: Right;  Right total shoulder arthroplasty   Patient Active Problem List   Diagnosis Date Noted   Osteoporosis 07/15/2024   Degenerative arthritis of left knee 03/17/2024   Dry skin 03/17/2024   COVID-19 04/23/2023   Spinal stenosis in cervical region 04/15/2023   Diarrhea 03/06/2023   Falls 03/06/2023   Fatigue 03/06/2023   Forgetfulness  03/06/2023   Light headedness 03/06/2023   Mass of hard palate 03/06/2023   Hypokalemia 03/06/2023   Hypothyroidism 03/06/2023   Hyperlipidemia 03/06/2023   Hypocalcemia 03/06/2023   Contusion of chest 02/05/2023   Left foot drop 10/31/2022   Pill dysphagia 07/11/2022   Change in bowel habits 07/11/2022   History of colonic polyps 07/11/2022   GERD (gastroesophageal reflux disease) 11/21/2019   Atypical chest pain 11/21/2019   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   Essential hypertension 07/13/2018   Lumbar spondylosis 06/10/2018   Thoracic spinal stenosis 03/18/2018   Spinal stenosis of lumbar region 03/18/2018   Gait abnormality 02/04/2018   Paresthesia 02/04/2018   MGUS (monoclonal gammopathy of unknown significance) 12/02/2017   Status post total shoulder arthroplasty 08/31/2014    ONSET DATE: 06/13/2024 (referral date)  REFERRING DIAG: R26.9 (ICD-10-CM) - Gait abnormality R26.89 (ICD-10-CM) - Balance problem  THERAPY DIAG:  Unsteadiness on feet  Other abnormalities of gait and mobility  Muscle weakness (generalized)  Rationale for Evaluation and Treatment: Rehabilitation  SUBJECTIVE:  SUBJECTIVE STATEMENT:  No new falls. Just got her hair done. Feels like she wobbles side to side when she is walking and unsure why.   Pt accompanied by: self  PERTINENT HISTORY: PMH: HTN, Hypothyroidism, arthritis,spinal stenosis, L foot drop, neuropathy  PAIN:  Are you having pain? No pt denies pain today   PRECAUTIONS: Fall  RED FLAGS: None   WEIGHT BEARING RESTRICTIONS: No  FALLS: Has patient fallen in last 6 months? Yes. Number of falls see subjective from eval  LIVING ENVIRONMENT: Lives with: lives alone Lives in: House/apartment Stairs: Yes:  External: 4 steps; on right going up Has following equipment at home: stand alone cane  PLOF: Independent with gait, Independent with transfers, and Requires assistive device for independence  PATIENT GOALS: be able to walk better, toss my cane, but not sure that will ever happen stay out of a wheelchair or walker - does use walker occasionally - in the yard I would like to not have surgery - Dr. Claudene thinks she needs to have it  OBJECTIVE:  Note: Objective measures were completed at Evaluation unless otherwise noted.  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  COGNITION: Overall cognitive status: reports memory impairments for about a year   SENSATION: Neuropathy in BLE (L>R)  COORDINATION: Impaired in BLE  EDEMA:  Feet and ankles get swollen sometimes   POSTURE: rounded shoulders, forward head, increased thoracic kyphosis, and posterior pelvic tilt  LOWER EXTREMITY ROM:     Active  Right Eval Left Eval  Hip flexion    Hip extension    Hip abduction    Hip adduction    Hip internal rotation    Hip external rotation    Knee flexion    Knee extension    Ankle dorsiflexion Tight gastroc Tight gastroc  Ankle plantarflexion    Ankle inversion    Ankle eversion     (Blank rows = not tested)  LOWER EXTREMITY MMT:    MMT Right Eval Left Eval  Hip flexion 5 5  Hip extension    Hip abduction    Hip adduction    Hip internal rotation    Hip external rotation    Knee flexion 3 3  Knee extension 4 4  Ankle dorsiflexion 3- 2-  Ankle plantarflexion    Ankle inversion    Ankle eversion    (Blank rows = not tested)  BED MOBILITY:  Mod I via logroll per patient report  TRANSFERS: Sit to stand: SBA  Assistive device utilized: None     Stand to sit: SBA  Assistive device utilized: None     Chair to chair: SBA  Assistive device  utilized: None       RAMP:  Not tested  CURB:  Not tested  STAIRS: Not tested GAIT: Findings:  Gait pattern: decreased hip/knee flexion- Left, decreased ankle dorsiflexion- Right, decreased ankle dorsiflexion- Left, poor foot clearance- Right, and poor foot clearance- Left Distance walked: various clinic distances Assistive device utilized: stand alone cane Level of assistance: Min A Comments: to be further assessed   FUNCTIONAL TESTS:     07/26/24 0001  Standardized Balance Assessment  Standardized Balance Assessment Berg Balance Test  Berg Balance Test  Sit to Stand 3 (doesn't use hands but very unstead and often needs time to stabilize or falls backwards but doesn't use assistance)  Standing Unsupported  0 (Only able to stand once patient orients themselves or uses chair ot balance)  Sitting with Back Unsupported but Feet Supported on Floor or Stool 4  Stand to Sit 1  Transfers 2  Standing Unsupported with Eyes Closed 0 (Only able to stand once patient orients themselves or uses chair ot balance)  Standing Unsupported with Feet Together 1 (Only able to stand once patient orients themselves or uses chair ot balance)  From Standing, Reach Forward with Outstretched Arm 0 (Must hold on to chair)  From Standing Position, Pick up Object from Floor 0  From Standing Position, Turn to Look Behind Over each Shoulder 0 (Must hold on to chair)  Turn 360 Degrees 1  Standing Unsupported, Alternately Place Feet on Step/Stool 1  Standing Unsupported, One Foot in Front 1  Standing on One Leg 0  Total Score 14  Berg comment: High risk for falls                                                                                                                                  TREATMENT DATE:  09/08/24   Therapeutic Activity/NMR:   Goal Assessment: TUG: 13.9 seconds with standalone cane, CGA Will assess remainder of goals next week   Continued to provide education about  neuropathy   Standing on air ex reaching across body to grab bean bag and then tossing it into crate, performed 12 reps each side   To work on dynamic balance with gait: Gait with stand alone gait 2 x 100' working on stop/go in forwards direction, retro gait, and changing directions to R and L  In // bars: Retro gait down and back x4 reps Side stepping with alt L/R toe taps to 4 gum drops, down and back x4 reps, trying to perform without UE support   To work on dynamic balance with horizontal head turns and balance recovery: Gait x 115 ft with v/c for L and R head turns Several mild LOB but pt able to recover, min A Gait x 230 ft with head turns L/R to find cards and name suits Very unsteady, with onset of fatigue decreased speed noted Min to mod A needed for balance and to prevent a fall To work on proximal BLE strengthening in half kneel position on red mat on floor 2# weighted dowel chest press 2 x 10 reps in L/R position 2# weighted dowel OH press 2 x 10 reps in L/R position   PATIENT EDUCATION: Education details: continue HEP, continued to educate on use of AD at all times due to significant fall risk (preferably a RW, but pt does not want to use one),  Person educated: Patient Education method: Explanation, Demonstration, Tactile cues, and Verbal cues Education comprehension: verbalized understanding, returned demonstration, and needs further education  HOME EXERCISE PROGRAM:  Access Code: [email or web address] URL: [email or web address] Date: 07/28/2024 Prepared by: Rock Kussmaul  Exercises - bolded exercises added to previous HEP -  Seated March  - 1-2 x daily - 5 x weekly - 2 sets - 10 reps - Seated Heel Raise  - 1-2 x daily - 5 x weekly - 2 sets - 10 reps - Seated Toe Raise  - 1-2 x daily - 5 x weekly - 2 sets - 10 reps - Standing Hip Extension with Counter Support  - 2 x daily - 7 x weekly - 1 sets - 10 reps - Standing Hip Abduction with Counter Support  - 2 x  daily - 7 x weekly - 1 sets - 10 reps - Standing March with Counter Support  - 2 x daily - 7 x weekly - 10 reps - Single Leg Bridge  - 1 x daily - 7 x weekly - 3 sets - 10 reps - Clam with Resistance  - 1 x daily - 7 x weekly - 3 sets - 10 reps   GOALS: Goals reviewed with patient? Yes  SHORT TERM GOALS: Target date: 08/11/2024  Pt will be independent with initial HEP for improved strength, balance, transfers and gait. Baseline: Goal status: Goal met - pt states she knows what she is supposed to do - 08-09-24  2.  Pt will improve gait velocity to at least 1.75 ft/sec for improved gait efficiency and performance at CGA level  Baseline: 1.66 ft/sec with stand alone cane min A (10/23); 15.78 secs = 2.08 ft/sec Goal status: Goal met 08-09-24  3.  Pt will improve 5 x STS to less than or equal to 25 seconds to demonstrate improved functional strength and transfer efficiency.  Baseline: 29.4 sec with no UE but posterior LOB and min A (10/23); 23.03 secs without UE support from mat - SBA Goal status: Goal met 08-09-24  4.  Pt will improve normal TUG to less than or equal to 16 seconds for improved functional mobility and decreased fall risk. Baseline: 19.35 sec with stand alone cane and min A (10/23); 18.31 secs with SPC - SBA  Goal status: Partially met 08-09-24  5.  Berg balance assessment will improve from 14/56 to at least 19/56 in order to demonstrate decreased risk of falls. Baseline: 14/56   14/56 (11/20) Goal status: NOT MET     LONG TERM GOALS: Target date: 09/08/2024 UPDATED GOAL DATE TO MATCH LAST APPT: 09/14/24   Pt will be independent with final HEP for improved strength, balance, transfers and gait. Baseline:  Goal status: INITIAL  2.  Pt will improve gait velocity to at least 2.0 ft/sec for improved gait efficiency and performance at SBA level  Baseline: 1.66 ft/sec with stand alone cane min A (10/23) Goal status: INITIAL  3.  Pt will improve 5 x STS to less than  or equal to 21 seconds to demonstrate improved functional strength and transfer efficiency.  Baseline:  29.4 sec with no UE but posterior LOB and min A (10/23) Goal status: INITIAL  4.  Pt will improve normal TUG to less than or equal to 13 seconds for improved functional mobility and decreased fall risk. Baseline: 19.35 sec with stand alone cane and min A (10/23)  13.9 seconds with standalone cane, CGA (12/18) Goal status: PARTIALLY MET  5.  Berg balance assessment will improve to at least 24/56 in order to demonstrate decreased risk of falls. Baseline: 14/56 Goal status: INITIAL    ASSESSMENT:  CLINICAL IMPRESSION: Emphasis of skilled PT session on working on dynamic balance with horizontal head turns and proximal BLE strengthening. Pt continues to exhibit poor  standing balance with inability to maintain static standing balance without UE support and several LOB when performing gait with horizontal head turns finding targets. She also exhibits poor ability to recover her balance once she loses it due to BLE neuropathy and B foot drop. She remains resistant to education regarding a more appropriate AD for her current level of function. She continues to benefit from skilled PT services to work towards LTGs. Continue POC.    OBJECTIVE IMPAIRMENTS: Abnormal gait, decreased balance, decreased cognition, decreased coordination, difficulty walking, decreased ROM, decreased strength, impaired perceived functional ability, impaired sensation, improper body mechanics, postural dysfunction, and pain.   ACTIVITY LIMITATIONS: carrying, lifting, bending, standing, stairs, transfers, bed mobility, and locomotion level  PARTICIPATION LIMITATIONS: meal prep, cleaning, laundry, driving, community activity, and yard work  PERSONAL FACTORS: Age, Time since onset of injury/illness/exacerbation, and 3+ comorbidities:   PMH: HTN, Hypothyroidism, arthritis,spinal stenosis, L foot drop, neuropathyare also  affecting patient's functional outcome.   REHAB POTENTIAL: Fair has tried PT in the past, buy-in regarding AD recommendations, age, cervical spinal stenosis, neuropathy, foot drop  CLINICAL DECISION MAKING: Unstable/unpredictable  EVALUATION COMPLEXITY: High  PLAN:  PT FREQUENCY: 2x/week  PT DURATION: 8 weeks  PLANNED INTERVENTIONS: [id number]- PT Re-evaluation, 97750- Physical Performance Testing, 97110-Therapeutic exercises, 97530- Therapeutic activity, [id number]- Neuromuscular re-education, 97535- Self Care, [id number]- Manual therapy, [id number]- Gait training, [phone]- Orthotic Initial, [phone]- Orthotic/Prosthetic subsequent, [phone]- Aquatic Therapy, [phone]- Electrical stimulation (manual), [phone] (1-2 muscles), [id number] (3+ muscles)- Dry Needling, Patient/Family education, Balance training, Stair training, Taping, Joint mobilization, Spinal mobilization, Cognitive remediation, DME instructions, Cryotherapy, and Moist heat  PLAN FOR NEXT SESSION: check one LTG and update goal date to match last scheduled appt, leg press for quad strengthening, work on LE placement and anterior weight shift with sit to stands, toe taps in standing, quad and gluteal strengthening in standing and seated/supine. Add to HEP accordingly (I.e. seated and supine until safe to perform standing at home), SLS, eccentric step downs, anterior weight shift with sit to stand, gumdrop taps, stop/go, stepping strategy, ankle strategy;   Sheffield Senate, PT, DPT 09/08/2024 12:03 PM

## 2024-09-12 ENCOUNTER — Ambulatory Visit: Admitting: Physical Therapy

## 2024-09-12 ENCOUNTER — Ambulatory Visit: Admitting: Nurse Practitioner

## 2024-09-14 ENCOUNTER — Ambulatory Visit: Admitting: Physical Therapy

## 2024-09-14 DIAGNOSIS — M6281 Muscle weakness (generalized): Secondary | ICD-10-CM

## 2024-09-14 DIAGNOSIS — R2689 Other abnormalities of gait and mobility: Secondary | ICD-10-CM

## 2024-09-14 DIAGNOSIS — R2681 Unsteadiness on feet: Secondary | ICD-10-CM

## 2024-09-14 NOTE — Therapy (Signed)
 " OUTPATIENT PHYSICAL THERAPY NEURO TREATMENT       Patient Name: Carol Woods MRN: [id number] DOB:[birth year], 89 y.o., female Today's Date: 09/14/2024   PCP: Elnor Lauraine FORBES, NP REFERRING PROVIDER: Claudene Arthea HERO, DO  END OF SESSION:  PT End of Session - 09/14/24 1104     Visit Number 14    Number of Visits 17   with eval   Date for Recertification  09/22/24    Authorization Type [employer]    PT Start Time 1100    PT Stop Time 1148    PT Time Calculation (min) 48 min    Equipment Utilized During Treatment Gait belt    Activity Tolerance Patient tolerated treatment well    Behavior During Therapy WFL for tasks assessed/performed                    Past Medical History:  Diagnosis Date   Arthritis    back; shoulders (08/31/2014)   Atypical chest pain 11/21/2019   Essential hypertension 07/13/2018   GERD (gastroesophageal reflux disease)    OTC, changed diet   High cholesterol    History of cataract surgery 02/05/2015   2nd eye done 07/23/2015   Hypertension    Hypothyroidism    Past Surgical History:  Procedure Laterality Date   ANTERIOR CERVICAL DECOMP/DISCECTOMY FUSION  02/2010   Dr Colon   BACK SURGERY     COLONOSCOPY W/ POLYPECTOMY     DILATION AND CURETTAGE OF UTERUS     POSTERIOR LUMBAR FUSION  04/2010   Dr Colon   TONSILLECTOMY     age 33   TOTAL SHOULDER ARTHROPLASTY Right 08/31/2014   TOTAL SHOULDER ARTHROPLASTY Right 08/31/2014   Procedure: TOTAL SHOULDER ARTHROPLASTY;  Surgeon: Eva Elsie Herring, MD;  Location: [facility] OR;  Service: Orthopedics;  Laterality: Right;  Right total shoulder arthroplasty   Patient Active Problem List   Diagnosis Date Noted   Osteoporosis 07/15/2024   Degenerative arthritis of left knee 03/17/2024   Dry skin 03/17/2024   COVID-19 04/23/2023   Spinal stenosis in cervical region 04/15/2023   Diarrhea 03/06/2023   Falls 03/06/2023   Fatigue 03/06/2023   Forgetfulness 03/06/2023   Light  headedness 03/06/2023   Mass of hard palate 03/06/2023   Hypokalemia 03/06/2023   Hypothyroidism 03/06/2023   Hyperlipidemia 03/06/2023   Hypocalcemia 03/06/2023   Contusion of chest 02/05/2023   Left foot drop 10/31/2022   Pill dysphagia 07/11/2022   Change in bowel habits 07/11/2022   History of colonic polyps 07/11/2022   GERD (gastroesophageal reflux disease) 11/21/2019   Atypical chest pain 11/21/2019   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   Essential hypertension 07/13/2018   Lumbar spondylosis 06/10/2018   Thoracic spinal stenosis 03/18/2018   Spinal stenosis of lumbar region 03/18/2018   Gait abnormality 02/04/2018   Paresthesia 02/04/2018   MGUS (monoclonal gammopathy of unknown significance) 12/02/2017   Status post total shoulder arthroplasty 08/31/2014    ONSET DATE: 06/13/2024 (referral date)  REFERRING DIAG: R26.9 (ICD-10-CM) - Gait abnormality R26.89 (ICD-10-CM) - Balance problem  THERAPY DIAG:  Unsteadiness on feet  Other abnormalities of gait and mobility  Muscle weakness (generalized)  Rationale for Evaluation and Treatment: Rehabilitation  SUBJECTIVE:  SUBJECTIVE STATEMENT:  Pt denies any falls or acute changes since last visit. She has been trying to do her exercises more often but it being the holidays it has been a lot.  Pt accompanied by: self  PERTINENT HISTORY: PMH: HTN, Hypothyroidism, arthritis,spinal stenosis, L foot drop, neuropathy  PAIN:  Are you having pain? No pt denies pain today   PRECAUTIONS: Fall  RED FLAGS: None   WEIGHT BEARING RESTRICTIONS: No  FALLS: Has patient fallen in last 6 months? Yes. Number of falls see subjective from eval  LIVING ENVIRONMENT: Lives with: lives alone Lives in:  House/apartment Stairs: Yes: External: 4 steps; on right going up Has following equipment at home: stand alone cane  PLOF: Independent with gait, Independent with transfers, and Requires assistive device for independence  PATIENT GOALS: be able to walk better, toss my cane, but not sure that will ever happen stay out of a wheelchair or walker - does use walker occasionally - in the yard I would like to not have surgery - Dr. Claudene thinks she needs to have it  OBJECTIVE:  Note: Objective measures were completed at Evaluation unless otherwise noted.  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  COGNITION: Overall cognitive status: reports memory impairments for about a year   SENSATION: Neuropathy in BLE (L>R)  COORDINATION: Impaired in BLE  EDEMA:  Feet and ankles get swollen sometimes   POSTURE: rounded shoulders, forward head, increased thoracic kyphosis, and posterior pelvic tilt  LOWER EXTREMITY ROM:     Active  Right Eval Left Eval  Hip flexion    Hip extension    Hip abduction    Hip adduction    Hip internal rotation    Hip external rotation    Knee flexion    Knee extension    Ankle dorsiflexion Tight gastroc Tight gastroc  Ankle plantarflexion    Ankle inversion    Ankle eversion     (Blank rows = not tested)  LOWER EXTREMITY MMT:    MMT Right Eval Left Eval  Hip flexion 5 5  Hip extension    Hip abduction    Hip adduction    Hip internal rotation    Hip external rotation    Knee flexion 3 3  Knee extension 4 4  Ankle dorsiflexion 3- 2-  Ankle plantarflexion    Ankle inversion    Ankle eversion    (Blank rows = not tested)  BED MOBILITY:  Mod I via logroll per patient report  TRANSFERS: Sit to stand: SBA  Assistive device utilized: None     Stand to sit: SBA  Assistive device utilized: None     Chair  to chair: SBA  Assistive device utilized: None       RAMP:  Not tested  CURB:  Not tested  STAIRS: Not tested GAIT: Findings:  Gait pattern: decreased hip/knee flexion- Left, decreased ankle dorsiflexion- Right, decreased ankle dorsiflexion- Left, poor foot clearance- Right, and poor foot clearance- Left Distance walked: various clinic distances Assistive device utilized: stand alone cane Level of assistance: Min A Comments: to be further assessed   FUNCTIONAL TESTS:     07/26/24 0001  Standardized Balance Assessment  Standardized Balance Assessment Berg Balance Test  Berg Balance Test  Sit to Stand 3 (doesn't use hands but very unstead and often needs time to stabilize or falls backwards  but doesn't use assistance)  Standing Unsupported 0 (Only able to stand once patient orients themselves or uses chair ot balance)  Sitting with Back Unsupported but Feet Supported on Floor or Stool 4  Stand to Sit 1  Transfers 2  Standing Unsupported with Eyes Closed 0 (Only able to stand once patient orients themselves or uses chair ot balance)  Standing Unsupported with Feet Together 1 (Only able to stand once patient orients themselves or uses chair ot balance)  From Standing, Reach Forward with Outstretched Arm 0 (Must hold on to chair)  From Standing Position, Pick up Object from Floor 0  From Standing Position, Turn to Look Behind Over each Shoulder 0 (Must hold on to chair)  Turn 360 Degrees 1  Standing Unsupported, Alternately Place Feet on Step/Stool 1  Standing Unsupported, One Foot in Front 1  Standing on One Leg 0  Total Score 14  Berg comment: High risk for falls                                                                                                                                  TREATMENT DATE:  09/14/24   Therapeutic Activity/NMR:  To work on functional mobility: Sit to stands x 15 reps Focus on anterior weight shift, bending knees when sitting, and  eccentric control when sitting Squats at ballet bar with BUE support: 3 x 15 reps In // bars with BUE support but focus on decreased support Alt L/R 6 step ups 3 x 10 reps Alt L/R 4 eccentric step downs 3 x 10 reps   Added squats to HEP, see bolded below   PATIENT EDUCATION: Education details: continue HEP, continued to educate on use of AD at all times due to significant fall risk (preferably a RW, but pt does not want to use one), continued education on neuropathy on pt's balance, plan to d/c next visit and pt can return in 3 months if needed Person educated: Patient Education method: Explanation, Demonstration, Tactile cues, Verbal cues, and Handouts Education comprehension: verbalized understanding, returned demonstration, and needs further education  HOME EXERCISE PROGRAM:  Access Code: [email or web address] URL: [email or web address] Date: 07/28/2024 Prepared by: Rock Kussmaul  Exercises - bolded exercises added to previous HEP - Seated March  - 1-2 x daily - 5 x weekly - 2 sets - 10 reps - Seated Heel Raise  - 1-2 x daily - 5 x weekly - 2 sets - 10 reps - Seated Toe Raise  - 1-2 x daily - 5 x weekly - 2 sets - 10 reps - Standing Hip Extension with Counter Support  - 2 x daily - 7 x weekly - 1 sets - 10 reps - Standing Hip Abduction with Counter Support  - 2 x daily - 7 x weekly - 1 sets - 10 reps - Standing March with Counter Support  - 2 x daily - 7 x weekly - 10 reps - Single Leg  Bridge  - 1 x daily - 7 x weekly - 3 sets - 10 reps - Clam with Resistance  - 1 x daily - 7 x weekly - 3 sets - 10 reps - Mini Squat with Counter Support  - 1 x daily - 7 x weekly - 3 sets - 15 reps   GOALS: Goals reviewed with patient? Yes  SHORT TERM GOALS: Target date: 08/11/2024  Pt will be independent with initial HEP for improved strength, balance, transfers and gait. Baseline: Goal status: Goal met - pt states she knows what she is supposed to do - 08-09-24  2.  Pt will improve  gait velocity to at least 1.75 ft/sec for improved gait efficiency and performance at CGA level  Baseline: 1.66 ft/sec with stand alone cane min A (10/23); 15.78 secs = 2.08 ft/sec Goal status: Goal met 08-09-24  3.  Pt will improve 5 x STS to less than or equal to 25 seconds to demonstrate improved functional strength and transfer efficiency.  Baseline: 29.4 sec with no UE but posterior LOB and min A (10/23); 23.03 secs without UE support from mat - SBA Goal status: Goal met 08-09-24  4.  Pt will improve normal TUG to less than or equal to 16 seconds for improved functional mobility and decreased fall risk. Baseline: 19.35 sec with stand alone cane and min A (10/23); 18.31 secs with SPC - SBA  Goal status: Partially met 08-09-24  5.  Berg balance assessment will improve from 14/56 to at least 19/56 in order to demonstrate decreased risk of falls. Baseline: 14/56   14/56 (11/20) Goal status: NOT MET     LONG TERM GOALS: Target date: 09/08/2024 UPDATED GOAL DATE TO MATCH LAST APPT: 09/20/24   Pt will be independent with final HEP for improved strength, balance, transfers and gait. Baseline:  Goal status: INITIAL  2.  Pt will improve gait velocity to at least 2.0 ft/sec for improved gait efficiency and performance at SBA level  Baseline: 1.66 ft/sec with stand alone cane min A (10/23) Goal status: INITIAL  3.  Pt will improve 5 x STS to less than or equal to 21 seconds to demonstrate improved functional strength and transfer efficiency.  Baseline:  29.4 sec with no UE but posterior LOB and min A (10/23) Goal status: INITIAL  4.  Pt will improve normal TUG to less than or equal to 13 seconds for improved functional mobility and decreased fall risk. Baseline: 19.35 sec with stand alone cane and min A (10/23)  13.9 seconds with standalone cane, CGA (12/18) Goal status: PARTIALLY MET  5.  Berg balance assessment will improve to at least 24/56 in order to demonstrate decreased risk  of falls. Baseline: 14/56 Goal status: INITIAL    ASSESSMENT:  CLINICAL IMPRESSION: Emphasis of skilled PT session on continuing to work on sit to stands as pt continues to struggle with this as well as working on dance movement psychotherapist. Pt continues to be retropulsive in standing with difficulty activating ankle strategy for balance recovery. She does well with quad strengthening exercises this visit with muscle fatigue by end of session. Plan to assess LTG and d/c next visit. Continue POC.    OBJECTIVE IMPAIRMENTS: Abnormal gait, decreased balance, decreased cognition, decreased coordination, difficulty walking, decreased ROM, decreased strength, impaired perceived functional ability, impaired sensation, improper body mechanics, postural dysfunction, and pain.   ACTIVITY LIMITATIONS: carrying, lifting, bending, standing, stairs, transfers, bed mobility, and locomotion level  PARTICIPATION LIMITATIONS: meal prep, cleaning, laundry,  driving, community activity, and yard work  PERSONAL FACTORS: Age, Time since onset of injury/illness/exacerbation, and 3+ comorbidities:   PMH: HTN, Hypothyroidism, arthritis,spinal stenosis, L foot drop, neuropathyare also affecting patient's functional outcome.   REHAB POTENTIAL: Fair has tried PT in the past, buy-in regarding AD recommendations, age, cervical spinal stenosis, neuropathy, foot drop  CLINICAL DECISION MAKING: Unstable/unpredictable  EVALUATION COMPLEXITY: High  PLAN:  PT FREQUENCY: 2x/week  PT DURATION: 8 weeks  PLANNED INTERVENTIONS: [id number]- PT Re-evaluation, 97750- Physical Performance Testing, 97110-Therapeutic exercises, 97530- Therapeutic activity, [id number]- Neuromuscular re-education, 97535- Self Care, [id number]- Manual therapy, [id number]- Gait training, [phone]- Orthotic Initial, [phone]- Orthotic/Prosthetic subsequent, [phone]- Aquatic Therapy, [phone]- Electrical stimulation (manual), [phone] (1-2 muscles), [id number] (3+ muscles)- Dry Needling,  Patient/Family education, Balance training, Stair training, Taping, Joint mobilization, Spinal mobilization, Cognitive remediation, DME instructions, Cryotherapy, and Moist heat  PLAN FOR NEXT SESSION: assess LTG and d/c   Waddell Southgate, PT Waddell Southgate, PT, DPT, CSRS  09/14/2024, 11:50 AM          "

## 2024-09-20 ENCOUNTER — Ambulatory Visit: Admitting: Physical Therapy

## 2024-09-20 NOTE — Therapy (Incomplete)
 " OUTPATIENT PHYSICAL THERAPY NEURO TREATMENT - DISCHARGE NOTE***       Patient Name: Carol Woods MRN: [id number] DOB:02/09/1934, 89 y.o., female Today's Date: 09/20/2024   PCP: Elnor Lauraine FORBES, NP REFERRING PROVIDER: Claudene Arthea HERO, DO  PHYSICAL THERAPY DISCHARGE SUMMARY  Visits from Start of Care: ***  Current functional level related to goals / functional outcomes: ***   Remaining deficits: ***   Education / Equipment: ***   Patient agrees to discharge. Patient goals were {OP Goals:25702::met}. Patient is being discharged due to {OP Discharge Reasons:25703::meeting the stated rehab goals.}   END OF SESSION:              Past Medical History:  Diagnosis Date   Arthritis    back; shoulders (08/31/2014)   Atypical chest pain 11/21/2019   Essential hypertension 07/13/2018   GERD (gastroesophageal reflux disease)    OTC, changed diet   High cholesterol    History of cataract surgery 02/05/2015   2nd eye done 07/23/2015   Hypertension    Hypothyroidism    Past Surgical History:  Procedure Laterality Date   ANTERIOR CERVICAL DECOMP/DISCECTOMY FUSION  02/2010   Dr Colon   BACK SURGERY     COLONOSCOPY W/ POLYPECTOMY     DILATION AND CURETTAGE OF UTERUS     POSTERIOR LUMBAR FUSION  04/2010   Dr Colon   TONSILLECTOMY     age 81   TOTAL SHOULDER ARTHROPLASTY Right 08/31/2014   TOTAL SHOULDER ARTHROPLASTY Right 08/31/2014   Procedure: TOTAL SHOULDER ARTHROPLASTY;  Surgeon: Eva Elsie Herring, MD;  Location: [facility] OR;  Service: Orthopedics;  Laterality: Right;  Right total shoulder arthroplasty   Patient Active Problem List   Diagnosis Date Noted   Osteoporosis 07/15/2024   Degenerative arthritis of left knee 03/17/2024   Dry skin 03/17/2024   COVID-19 04/23/2023   Spinal stenosis in cervical region 04/15/2023   Diarrhea 03/06/2023   Falls 03/06/2023   Fatigue 03/06/2023   Forgetfulness 03/06/2023   Light headedness 03/06/2023   Mass  of hard palate 03/06/2023   Hypokalemia 03/06/2023   Hypothyroidism 03/06/2023   Hyperlipidemia 03/06/2023   Hypocalcemia 03/06/2023   Contusion of chest 02/05/2023   Left foot drop 10/31/2022   Pill dysphagia 07/11/2022   Change in bowel habits 07/11/2022   History of colonic polyps 07/11/2022   GERD (gastroesophageal reflux disease) 11/21/2019   Atypical chest pain 11/21/2019   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  Rationale for Evaluation and Treatment: Rehabilitation  SUBJECTIVE:  SUBJECTIVE STATEMENT:  Pt denies any falls or acute changes since last visit. She has been trying to do her exercises more often but it being the holidays it has been a lot.  ***  Pt accompanied by: self  PERTINENT HISTORY: PMH: HTN, Hypothyroidism, arthritis,spinal stenosis, L foot drop, neuropathy  PAIN:  Are you having pain? No pt denies pain today   PRECAUTIONS: Fall  RED FLAGS: None   WEIGHT BEARING RESTRICTIONS: No  FALLS: Has patient fallen in last 6 months? Yes. Number of falls see subjective from eval  LIVING ENVIRONMENT: Lives with: lives alone Lives in: House/apartment Stairs: Yes: External: 4 steps; on right going up Has following equipment at home: stand  alone cane  PLOF: Independent with gait, Independent with transfers, and Requires assistive device for independence  PATIENT GOALS: be able to walk better, toss my cane, but not sure that will ever happen stay out of a wheelchair or walker - does use walker occasionally - in the yard I would like to not have surgery - Dr. Claudene thinks she needs to have it  OBJECTIVE:  Note: Objective measures were completed at Evaluation unless otherwise noted.  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  COGNITION: Overall cognitive status: reports memory impairments for about a year   SENSATION: Neuropathy in BLE (L>R)  COORDINATION: Impaired in BLE  EDEMA:  Feet and ankles get swollen sometimes   POSTURE: rounded shoulders, forward head, increased thoracic kyphosis, and posterior pelvic tilt  LOWER EXTREMITY ROM:     Active  Right Eval Left Eval  Hip flexion    Hip extension    Hip abduction    Hip adduction    Hip internal rotation    Hip external rotation    Knee flexion    Knee extension    Ankle dorsiflexion Tight gastroc Tight gastroc  Ankle plantarflexion    Ankle inversion    Ankle eversion     (Blank rows = not tested)  LOWER EXTREMITY MMT:    MMT Right Eval Left Eval  Hip flexion 5 5  Hip extension    Hip abduction    Hip adduction    Hip internal rotation    Hip external rotation    Knee flexion 3 3  Knee extension 4 4  Ankle dorsiflexion 3- 2-  Ankle plantarflexion    Ankle inversion    Ankle eversion    (Blank rows = not tested)  BED MOBILITY:  Mod I via logroll per patient report  TRANSFERS: Sit to stand: SBA  Assistive device utilized: None     Stand to sit: SBA  Assistive device utilized: None     Chair to chair: SBA  Assistive device utilized: None       RAMP:  Not tested  CURB:  Not  tested  STAIRS: Not tested GAIT: Findings:  Gait pattern: decreased hip/knee flexion- Left, decreased ankle dorsiflexion- Right, decreased ankle dorsiflexion- Left, poor foot clearance- Right, and poor foot clearance- Left Distance walked: various clinic distances Assistive device utilized: stand alone cane Level of assistance: Min A Comments: to be further assessed   FUNCTIONAL TESTS:     07/26/24 0001  Standardized Balance Assessment  Standardized Balance Assessment Berg Balance Test  Berg Balance Test  Sit to Stand 3 (doesn't use hands but very unstead and often needs time to stabilize or  falls backwards but doesn't use assistance)  Standing Unsupported 0 (Only able to stand once patient orients themselves or uses chair ot balance)  Sitting with Back Unsupported but Feet Supported on Floor or Stool 4  Stand to Sit 1  Transfers 2  Standing Unsupported with Eyes Closed 0 (Only able to stand once patient orients themselves or uses chair ot balance)  Standing Unsupported with Feet Together 1 (Only able to stand once patient orients themselves or uses chair ot balance)  From Standing, Reach Forward with Outstretched Arm 0 (Must hold on to chair)  From Standing Position, Pick up Object from Floor 0  From Standing Position, Turn to Look Behind Over each Shoulder 0 (Must hold on to chair)  Turn 360 Degrees 1  Standing Unsupported, Alternately Place Feet on Step/Stool 1  Standing Unsupported, One Foot in Front 1  Standing on One Leg 0  Total Score 14  Berg comment: High risk for falls                                                                                                                                  TREATMENT DATE:  09/20/24   Therapeutic Activity/NMR:  To work on functional mobility: Sit to stands x 15 reps Focus on anterior weight shift, bending knees when sitting, and eccentric control when sitting Squats at ballet bar with BUE support: 3 x 15 reps In  // bars with BUE support but focus on decreased support Alt L/R 6 step ups 3 x 10 reps Alt L/R 4 eccentric step downs 3 x 10 reps   Added squats to HEP, see bolded below   Physical Performance ***   PATIENT EDUCATION: Education details: continue HEP, continued to educate on use of AD at all times due to significant fall risk (preferably a RW, but pt does not want to use one), continued education on neuropathy on pt's balance, plan to d/c next visit and pt can return in 3 months if needed*** Person educated: Patient Education method: Explanation, Demonstration, Tactile cues, Verbal cues, and Handouts Education comprehension: verbalized understanding, returned demonstration, and needs further education  HOME EXERCISE PROGRAM:  Access Code: [email or web address] URL: [email or web address] Date: 07/28/2024 Prepared by: Rock Kussmaul  Exercises - bolded exercises added to previous HEP - Seated March  - 1-2 x daily - 5 x weekly - 2 sets - 10 reps - Seated Heel Raise  - 1-2 x daily - 5 x weekly - 2 sets - 10 reps - Seated Toe Raise  - 1-2 x daily - 5 x weekly - 2 sets - 10 reps - Standing Hip Extension with Counter Support  - 2 x daily - 7 x weekly - 1 sets - 10 reps - Standing Hip Abduction with Counter Support  - 2 x daily - 7 x weekly - 1 sets - 10 reps - Standing March with Counter Support  - 2 x daily - 7 x  weekly - 10 reps - Single Leg Bridge  - 1 x daily - 7 x weekly - 3 sets - 10 reps - Clam with Resistance  - 1 x daily - 7 x weekly - 3 sets - 10 reps - Mini Squat with Counter Support  - 1 x daily - 7 x weekly - 3 sets - 15 reps   GOALS: Goals reviewed with patient? Yes  SHORT TERM GOALS: Target date: 08/11/2024  Pt will be independent with initial HEP for improved strength, balance, transfers and gait. Baseline: Goal status: Goal met - pt states she knows what she is supposed to do - 08-09-24  2.  Pt will improve gait velocity to at least 1.75 ft/sec for improved gait  efficiency and performance at CGA level  Baseline: 1.66 ft/sec with stand alone cane min A (10/23); 15.78 secs = 2.08 ft/sec Goal status: Goal met 08-09-24  3.  Pt will improve 5 x STS to less than or equal to 25 seconds to demonstrate improved functional strength and transfer efficiency.  Baseline: 29.4 sec with no UE but posterior LOB and min A (10/23); 23.03 secs without UE support from mat - SBA Goal status: Goal met 08-09-24  4.  Pt will improve normal TUG to less than or equal to 16 seconds for improved functional mobility and decreased fall risk. Baseline: 19.35 sec with stand alone cane and min A (10/23); 18.31 secs with SPC - SBA  Goal status: Partially met 08-09-24  5.  Berg balance assessment will improve from 14/56 to at least 19/56 in order to demonstrate decreased risk of falls. Baseline: 14/56   14/56 (11/20) Goal status: NOT MET     LONG TERM GOALS: Target date: 09/08/2024*** UPDATED GOAL DATE TO MATCH LAST APPT: 09/20/24   Pt will be independent with final HEP for improved strength, balance, transfers and gait. Baseline:  Goal status: INITIAL  2.  Pt will improve gait velocity to at least 2.0 ft/sec for improved gait efficiency and performance at SBA level  Baseline: 1.66 ft/sec with stand alone cane min A (10/23) Goal status: INITIAL  3.  Pt will improve 5 x STS to less than or equal to 21 seconds to demonstrate improved functional strength and transfer efficiency.  Baseline:  29.4 sec with no UE but posterior LOB and min A (10/23) Goal status: INITIAL  4.  Pt will improve normal TUG to less than or equal to 13 seconds for improved functional mobility and decreased fall risk. Baseline: 19.35 sec with stand alone cane and min A (10/23)  13.9 seconds with standalone cane, CGA (12/18) Goal status: PARTIALLY MET  5.  Berg balance assessment will improve to at least 24/56 in order to demonstrate decreased risk of falls. Baseline: 14/56 Goal status:  INITIAL    ASSESSMENT:  CLINICAL IMPRESSION: Emphasis of skilled PT session on*** continuing to work on sit to stands as pt continues to struggle with this as well as working on dance movement psychotherapist. Pt continues to be retropulsive in standing with difficulty activating ankle strategy for balance recovery. She does well with quad strengthening exercises this visit with muscle fatigue by end of session. Plan to assess LTG and d/c next visit. Continue POC.    OBJECTIVE IMPAIRMENTS: Abnormal gait, decreased balance, decreased cognition, decreased coordination, difficulty walking, decreased ROM, decreased strength, impaired perceived functional ability, impaired sensation, improper body mechanics, postural dysfunction, and pain.   ACTIVITY LIMITATIONS: carrying, lifting, bending, standing, stairs, transfers, bed mobility, and locomotion level  PARTICIPATION LIMITATIONS: meal prep, cleaning, laundry, driving, community activity, and yard work  PERSONAL FACTORS: Age, Time since onset of injury/illness/exacerbation, and 3+ comorbidities:   PMH: HTN, Hypothyroidism, arthritis,spinal stenosis, L foot drop, neuropathyare also affecting patient's functional outcome.   REHAB POTENTIAL: Fair has tried PT in the past, buy-in regarding AD recommendations, age, cervical spinal stenosis, neuropathy, foot drop  CLINICAL DECISION MAKING: Unstable/unpredictable  EVALUATION COMPLEXITY: High  PLAN:  PT FREQUENCY: 2x/week  PT DURATION: 8 weeks  PLANNED INTERVENTIONS: [id number]- PT Re-evaluation, 97750- Physical Performance Testing, 97110-Therapeutic exercises, 97530- Therapeutic activity, [id number]- Neuromuscular re-education, 97535- Self Care, [id number]- Manual therapy, [id number]- Gait training, [phone]- Orthotic Initial, [phone]- Orthotic/Prosthetic subsequent, [phone]- Aquatic Therapy, [phone]- Electrical stimulation (manual), [phone] (1-2 muscles), [id number] (3+ muscles)- Dry Needling, Patient/Family education, Balance training, Stair  training, Taping, Joint mobilization, Spinal mobilization, Cognitive remediation, DME instructions, Cryotherapy, and Moist heat  PLAN FOR NEXT SESSION: assess LTG and d/c***   Zipporah Finamore, PT W[place]ell [place], PT, DPT, CSRS  09/20/2024, 7:49 AM          "

## 2024-09-27 ENCOUNTER — Ambulatory Visit: Admitting: Physical Therapy

## 2024-09-28 ENCOUNTER — Ambulatory Visit: Attending: Family Medicine | Admitting: Physical Therapy

## 2024-09-28 DIAGNOSIS — R2689 Other abnormalities of gait and mobility: Secondary | ICD-10-CM | POA: Insufficient documentation

## 2024-09-28 DIAGNOSIS — R2681 Unsteadiness on feet: Secondary | ICD-10-CM | POA: Insufficient documentation

## 2024-09-28 DIAGNOSIS — M6281 Muscle weakness (generalized): Secondary | ICD-10-CM | POA: Diagnosis present

## 2024-09-28 NOTE — Therapy (Signed)
 " OUTPATIENT PHYSICAL THERAPY NEURO TREATMENT - RECERT and DISCHARGE NOTE       Patient Name: Carol Woods MRN: [id number] DOB:19-Oct-1933, 89 y.o., female Today's Date: 09/28/2024   PCP: Elnor Lauraine FORBES, NP REFERRING PROVIDER: Claudene Arthea HERO, DO  PHYSICAL THERAPY DISCHARGE SUMMARY  Visits from Start of Care: 15  Current functional level related to goals / functional outcomes: Mod I with stand alone cane   Remaining deficits: Ongoing balance impairments due to peripheral neuropathy and B foot drop   Education / Equipment: Handout for final HEP, continue use of stand alone cane at minimum although RW would be preferred   Patient agrees to discharge. Patient goals were partially met. Patient is being discharged due to maximized rehab potential.    END OF SESSION:  PT End of Session - 09/28/24 1107     Visit Number 15    Number of Visits 17   with eval   Date for Recertification  09/28/24   recert, final appt moved due to clinic closure   Authorization Type [employer]    PT Start Time 1105   pt arrived late   PT Stop Time 1143    PT Time Calculation (min) 38 min    Equipment Utilized During Treatment Gait belt    Activity Tolerance Patient tolerated treatment well    Behavior During Therapy WFL for tasks assessed/performed                     Past Medical History:  Diagnosis Date   Arthritis    back; shoulders (08/31/2014)   Atypical chest pain 11/21/2019   Essential hypertension 07/13/2018   GERD (gastroesophageal reflux disease)    OTC, changed diet   High cholesterol    History of cataract surgery 02/05/2015   2nd eye done 07/23/2015   Hypertension    Hypothyroidism    Past Surgical History:  Procedure Laterality Date   ANTERIOR CERVICAL DECOMP/DISCECTOMY FUSION  02/2010   Dr Colon   BACK SURGERY     COLONOSCOPY W/ POLYPECTOMY     DILATION AND CURETTAGE OF UTERUS     POSTERIOR LUMBAR FUSION  04/2010   Dr Colon   TONSILLECTOMY      age 79   TOTAL SHOULDER ARTHROPLASTY Right 08/31/2014   TOTAL SHOULDER ARTHROPLASTY Right 08/31/2014   Procedure: TOTAL SHOULDER ARTHROPLASTY;  Surgeon: Eva Elsie Herring, MD;  Location: [facility] OR;  Service: Orthopedics;  Laterality: Right;  Right total shoulder arthroplasty   Patient Active Problem List   Diagnosis Date Noted   Osteoporosis 07/15/2024   Degenerative arthritis of left knee 03/17/2024   Dry skin 03/17/2024   COVID-19 04/23/2023   Spinal stenosis in cervical region 04/15/2023   Diarrhea 03/06/2023   Falls 03/06/2023   Fatigue 03/06/2023   Forgetfulness 03/06/2023   Light headedness 03/06/2023   Mass of hard palate 03/06/2023   Hypokalemia 03/06/2023   Hypothyroidism 03/06/2023   Hyperlipidemia 03/06/2023   Hypocalcemia 03/06/2023   Contusion of chest 02/05/2023   Left foot drop 10/31/2022   Pill dysphagia 07/11/2022   Change in bowel habits 07/11/2022   History of colonic polyps 07/11/2022   GERD (gastroesophageal reflux disease) 11/21/2019   Atypical chest pain 11/21/2019   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   Essential hypertension 07/13/2018   Lumbar spondylosis 06/10/2018   Thoracic spinal stenosis 03/18/2018   Spinal stenosis of lumbar region 03/18/2018   Gait abnormality 02/04/2018   Paresthesia 02/04/2018  MGUS (monoclonal gammopathy of unknown significance) 12/02/2017   Status post total shoulder arthroplasty 08/31/2014    ONSET DATE: 06/13/2024 (referral date)  REFERRING DIAG: R26.9 (ICD-10-CM) - Gait abnormality R26.89 (ICD-10-CM) - Balance problem  THERAPY DIAG:  Unsteadiness on feet  Other abnormalities of gait and mobility  Muscle weakness (generalized)  Rationale for Evaluation and Treatment: Rehabilitation  SUBJECTIVE:                                                                                                                                                                                              SUBJECTIVE STATEMENT:  Pt reports that her balance remains erratic. Pt denies any falls since last visit, no near falls either.  Pt accompanied by: self  PERTINENT HISTORY: PMH: HTN, Hypothyroidism, arthritis,spinal stenosis, L foot drop, neuropathy  PAIN:  Are you having pain? No pt denies pain today   PRECAUTIONS: Fall  RED FLAGS: None   WEIGHT BEARING RESTRICTIONS: No  FALLS: Has patient fallen in last 6 months? Yes. Number of falls see subjective from eval  LIVING ENVIRONMENT: Lives with: lives alone Lives in: House/apartment Stairs: Yes: External: 4 steps; on right going up Has following equipment at home: stand alone cane  PLOF: Independent with gait, Independent with transfers, and Requires assistive device for independence  PATIENT GOALS: be able to walk better, toss my cane, but not sure that will ever happen stay out of a wheelchair or walker - does use walker occasionally - in the yard I would like to not have surgery - Dr. Claudene thinks she needs to have it  OBJECTIVE:  Note: Objective measures were completed at Evaluation unless otherwise noted.  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  COGNITION: Overall cognitive status: reports memory impairments for about a year   SENSATION: Neuropathy in BLE (L>R)  COORDINATION: Impaired in BLE  EDEMA:  Feet and ankles get swollen sometimes   POSTURE: rounded shoulders, forward head, increased thoracic kyphosis, and posterior pelvic tilt  LOWER EXTREMITY ROM:     Active  Right Eval Left Eval  Hip flexion    Hip extension    Hip abduction    Hip adduction    Hip internal rotation    Hip external rotation    Knee flexion    Knee extension    Ankle dorsiflexion Tight gastroc Tight gastroc  Ankle plantarflexion    Ankle inversion    Ankle eversion      (Blank rows = not tested)  LOWER EXTREMITY MMT:    MMT Right Eval Left Eval  Hip flexion 5 5  Hip extension    Hip abduction    Hip adduction    Hip internal rotation    Hip external rotation    Knee flexion 3 3  Knee extension 4 4  Ankle dorsiflexion 3- 2-  Ankle plantarflexion    Ankle inversion    Ankle eversion    (Blank rows = not tested)  BED MOBILITY:  Mod I via logroll per patient report  TRANSFERS: Sit to stand: SBA  Assistive device utilized: None     Stand to sit: SBA  Assistive device utilized: None     Chair to chair: SBA  Assistive device utilized: None       RAMP:  Not tested  CURB:  Not tested  STAIRS: Not tested GAIT: Findings:  Gait pattern: decreased hip/knee flexion- Left, decreased ankle dorsiflexion- Right, decreased ankle dorsiflexion- Left, poor foot clearance- Right, and poor foot clearance- Left Distance walked: various clinic distances Assistive device utilized: stand alone cane Level of assistance: Min A Comments: to be further assessed   FUNCTIONAL TESTS:     07/26/24 0001  Standardized Balance Assessment  Standardized Balance Assessment Berg Balance Test  Berg Balance Test  Sit to Stand 3 (doesn't use hands but very unstead and often needs time to stabilize or falls backwards but doesn't use assistance)  Standing Unsupported 0 (Only able to stand once patient orients themselves or uses chair ot balance)  Sitting with Back Unsupported but Feet Supported on Floor or Stool 4  Stand to Sit 1  Transfers 2  Standing Unsupported with Eyes Closed 0 (Only able to stand once patient orients themselves or uses chair ot balance)  Standing Unsupported with Feet Together 1 (Only able to stand once patient orients themselves or uses chair ot balance)  From Standing, Reach Forward with Outstretched Arm 0 (Must hold on to chair)  From Standing Position, Pick up Object from Floor 0  From Standing Position, Turn to Look Behind Over each  Shoulder 0 (Must hold on to chair)  Turn 360 Degrees 1  Standing Unsupported, Alternately Place Feet on Step/Stool 1  Standing Unsupported, One Foot in Front 1  Standing on One Leg 0  Total Score 14  Berg comment: High risk for falls                                                                                                                                  TREATMENT DATE:  09/28/24  Physical Performance For LTG assessment:  [facility] PT Assessment - 09/28/24 1114       Ambulation/Gait   Gait velocity 32.8 ft over 15.03 sec = 2.18 ft/sec      Standardized Balance Assessment   Standardized Balance Assessment Berg Balance Test;Five Times Sit to Stand;Timed Up and Go Test    Five times sit to stand comments  21.56 sec   no UE, posterior LOB     Berg Balance Test   Sit to Stand Able to stand without using hands and stabilize independently    Standing Unsupported Able to stand 30 seconds unsupported    Sitting with Back Unsupported but Feet Supported on Floor or Stool Able to sit safely and securely 2 minutes    Stand to Sit Sits independently, has uncontrolled descent    Transfers Able to transfer safely, minor use of hands    Standing Unsupported with Eyes Closed Able to stand 10 seconds with supervision    Standing Unsupported with Feet Together Needs help to attain position and unable to hold for 15 seconds    From Standing, Reach Forward with Outstretched Arm Loses balance while trying/requires external support    From Standing Position, Pick up Object from Floor Unable to try/needs assist to keep balance    From Standing Position, Turn to Look Behind Over each Shoulder Needs supervision when turning    Turn 360 Degrees Needs close supervision or verbal cueing    Standing Unsupported, Alternately Place Feet on Step/Stool Able to complete >2 steps/needs minimal assist    Standing Unsupported, One Foot in [employer] balance while stepping or standing    Standing on One Leg Tries  to lift leg/unable to hold 3 seconds but remains standing independently    Total Score 22    Berg comment: 22/56, high fall risk      Timed Up and Go Test   TUG Normal TUG    Normal TUG (seconds) 13.44   stand alone cane and mod I           PATIENT EDUCATION: Education details: results of OM and functional implications, plan to d/c this visit and pt can return in 3 months if needed, encouraged her to continue with her HEP and she can ride recumbent bike at the gym and walk on the track at [facility] Person educated: Patient Education method: Explanation and Demonstration Education comprehension: verbalized understanding and returned demonstration  HOME EXERCISE PROGRAM:  Access Code: [email or web address] URL: [email or web address] Date: 07/28/2024 Prepared by: Rock Kussmaul  Exercises - bolded exercises added to previous HEP - Seated March  - 1-2 x daily - 5 x weekly - 2 sets - 10 reps - Seated Heel Raise  - 1-2 x daily - 5 x weekly - 2 sets - 10 reps - Seated Toe Raise  - 1-2 x daily - 5 x weekly - 2 sets - 10 reps - Standing Hip Extension with Counter Support  - 2 x daily - 7 x weekly - 1 sets - 10 reps - Standing Hip Abduction with Counter Support  - 2 x daily - 7 x weekly - 1 sets - 10 reps - Standing March with Counter Support  - 2 x daily - 7 x weekly - 10 reps - Single Leg Bridge  - 1 x daily - 7 x weekly - 3 sets - 10 reps - Clam with Resistance  - 1 x daily - 7 x weekly - 3 sets - 10 reps - Mini Squat with Counter Support  - 1 x daily - 7 x weekly - 3 sets - 15 reps   GOALS: Goals reviewed with patient? Yes  SHORT TERM GOALS: Target date: 08/11/2024  Pt will be independent with initial HEP for improved strength, balance, transfers and gait. Baseline: Goal status: Goal met - pt states she knows what she is supposed to do -  08-09-24  2.  Pt will improve gait velocity to at least 1.75 ft/sec for improved gait efficiency and performance at CGA level  Baseline: 1.66  ft/sec with stand alone cane min A (10/23); 15.78 secs = 2.08 ft/sec Goal status: Goal met 08-09-24  3.  Pt will improve 5 x STS to less than or equal to 25 seconds to demonstrate improved functional strength and transfer efficiency.  Baseline: 29.4 sec with no UE but posterior LOB and min A (10/23); 23.03 secs without UE support from mat - SBA Goal status: Goal met 08-09-24  4.  Pt will improve normal TUG to less than or equal to 16 seconds for improved functional mobility and decreased fall risk. Baseline: 19.35 sec with stand alone cane and min A (10/23); 18.31 secs with SPC - SBA  Goal status: Partially met 08-09-24  5.  Berg balance assessment will improve from 14/56 to at least 19/56 in order to demonstrate decreased risk of falls. Baseline: 14/56   14/56 (11/20) Goal status: NOT MET     LONG TERM GOALS: Target date: 09/08/2024 UPDATED GOAL DATE TO MATCH LAST APPT: 09/20/24   Pt will be independent with final HEP for improved strength, balance, transfers and gait. Baseline:  Goal status: MET  2.  Pt will improve gait velocity to at least 2.0 ft/sec for improved gait efficiency and performance at SBA level  Baseline: 1.66 ft/sec with stand alone cane min A (10/23), 2.18 ft/sec with stand alone cane SBA (1/7) Goal status: MET  3.  Pt will improve 5 x STS to less than or equal to 21 seconds to demonstrate improved functional strength and transfer efficiency.  Baseline:  29.4 sec with no UE but posterior LOB and min A (10/23), 21.56 sec no UE but posterior LOB (1/7) Goal status: MET  4.  Pt will improve normal TUG to less than or equal to 13 seconds for improved functional mobility and decreased fall risk. Baseline: 19.35 sec with stand alone cane and min A (10/23), 13.9 seconds with standalone cane, CGA (12/18), 13.44 sec with stand alone cane and SBA (1/7) Goal status: MET  5.  Berg balance assessment will improve to at least 24/56 in order to demonstrate decreased risk of  falls. Baseline: 14/56, 22/56 (1/7) Goal status: NOT MET    ASSESSMENT:  CLINICAL IMPRESSION: Emphasis of skilled PT session on assessing LTG with planned d/c from OPPT services this visit. Pt unable to attend last scheduled appt within POC due to clinic closure so rescheduled for this visit, recert to cover d/c visit. Pt has met 4/5 LTG due to being independent with her final HEP, improving her gait speed, her 5xSTS score, and her TUG score. She did significantly improve her Berg score as compared to initial assessment but did not quite improve enough to meet her LTG. Overall she does exhibit improved balance and decreased fall risk, however she does remain a high fall risk basked on her chronic neuropathy and B foot drop. It is unlikely that she will improve significantly beyond her current level. Educated her that she would likely be safer with a RW but understanding that she prefers to continue to use stand alone cane at this time. Discussed her continuing with her HEP and safe gym activities. Pt agreeable to d/c.    OBJECTIVE IMPAIRMENTS: Abnormal gait, decreased balance, decreased cognition, decreased coordination, difficulty walking, decreased ROM, decreased strength, impaired perceived functional ability, impaired sensation, improper body mechanics, postural dysfunction, and pain.   ACTIVITY  LIMITATIONS: carrying, lifting, bending, standing, stairs, transfers, bed mobility, and locomotion level  PARTICIPATION LIMITATIONS: meal prep, cleaning, laundry, driving, community activity, and yard work  PERSONAL FACTORS: Age, Time since onset of injury/illness/exacerbation, and 3+ comorbidities:   PMH: HTN, Hypothyroidism, arthritis,spinal stenosis, L foot drop, neuropathyare also affecting patient's functional outcome.   REHAB POTENTIAL: Fair has tried PT in the past, buy-in regarding AD recommendations, age, cervical spinal stenosis, neuropathy, foot drop  CLINICAL DECISION MAKING:  Unstable/unpredictable  EVALUATION COMPLEXITY: High  PLAN: discharge from PT   Waddell Southgate, PT Waddell Southgate, PT, DPT, CSRS  09/28/2024, 11:43 AM          "

## 2024-10-20 NOTE — Progress Notes (Signed)
 " Carol [facility] [address] [place] [address] Phone: [phone] Subjective:   [name], am serving as a scribe for Dr. Arthea Claudene.  I'm seeing this patient by the request  of:  Carol Lauraine BRAVO, NP  CC: Left foot drop follow-up  [name]  07/25/2024 AFO would be beneficial for this individual.  Referred patient again today. I personally spent a total of 33 minutes in the care of the patient today including preparing to see the patient, performing a medically appropriate exam/evaluation, counseling and educating, placing orders, documenting clinical information in the EHR, communicating results, and coordinating care.     In a concern that the spinal stenosis is getting the best of her.  Encouraged her to potentially follow-up with her neurosurgeon.  Patient still would not want to do any type of surgical intervention if not necessary.  Discussed icing regimen.  Discussed continuing the other medications.  No is going to do physical therapy which I think will be significantly beneficial for this person to even decrease her risk of injuries.  Follow-up with me again in 6 to 8 weeks otherwise.      Update 10/26/2024 Carol Woods is a 89 y.o. female coming in with complaint of cervical spine pain and L foot drop. Referred to [facility] for AFO.  Patient has been very regularly going to physical therapy as well.  Patient states all problems have stayed about the same. No new symptoms. Foot has calmed down just a little. Still trouble on lateral side and 5th toe.    Reviewing patient's chart has seen oncology for MGUS.  Patient did have laboratory workup and urine accumulation of the lambda type proteins and monoclonal band encourage retesting in 4 months.  M spike noted on serology but stable from previous laboratory workup.  LDH is within normal range.  Patient did have a bone survey done that was unremarkable.  Past Medical History:  Diagnosis  Date   Arthritis    back; shoulders (08/31/2014)   Atypical chest pain 11/21/2019   Essential hypertension 07/13/2018   GERD (gastroesophageal reflux disease)    OTC, changed diet   High cholesterol    History of cataract surgery 02/05/2015   2nd eye done 07/23/2015   Hypertension    Hypothyroidism    Past Surgical History:  Procedure Laterality Date   ANTERIOR CERVICAL DECOMP/DISCECTOMY FUSION  02/2010   Dr Colon   BACK SURGERY     COLONOSCOPY W/ POLYPECTOMY     DILATION AND CURETTAGE OF UTERUS     POSTERIOR LUMBAR FUSION  04/2010   Dr Colon   TONSILLECTOMY     age 33   TOTAL SHOULDER ARTHROPLASTY Right 08/31/2014   TOTAL SHOULDER ARTHROPLASTY Right 08/31/2014   Procedure: TOTAL SHOULDER ARTHROPLASTY;  Surgeon: Eva Elsie Herring, MD;  Location: [facility] OR;  Service: Orthopedics;  Laterality: Right;  Right total shoulder arthroplasty   Social History   Socioeconomic History   Marital status: Divorced    Spouse name: Not on file   Number of children: Not on file   Years of education: Not on file   Highest education level: Master's degree (e.g., MA, MS, MEng, MEd, MSW, MBA)  Occupational History   Occupation: RETIRED  Tobacco Use   Smoking status: Former    Current packs/day: 0.00    Average packs/day: 1.5 packs/day for 25.0 years (37.5 ttl pk-yrs)    Types: Cigarettes    Start date: 08/23/1959  Quit date: 08/22/1984    Years since quitting: 40.2   Smokeless tobacco: Never  Vaping Use   Vaping status: Never Used  Substance and Sexual Activity   Alcohol  use: Not Currently    Comment: occasionally/ wine and mixed drinks   Drug use: No   Sexual activity: Not on file  Other Topics Concern   Not on file  Social History Narrative   Lives alone and has a cat/2025   Social Drivers of Health   Tobacco Use: Medium Risk (09/08/2024)   Patient History    Smoking Tobacco Use: Former    Smokeless Tobacco Use: Never    Passive Exposure: Not on Surveyor, Minerals Strain: Low Risk (04/28/2024)   Overall Financial Resource Strain (CARDIA)    Difficulty of Paying Living Expenses: Not hard at all  Food Insecurity: Unknown (08/31/2024)   Epic    Worried About Programme Researcher, Broadcasting/film/video in the Last Year: Never true    [employer] of Food in the Last Year: Not on file  Transportation Needs: No Transportation Needs (08/31/2024)   Epic    Lack of Transportation (Medical): No    Lack of Transportation (Non-Medical): No  Physical Activity: Inactive (04/28/2024)   Exercise Vital Sign    Days of Exercise per Week: 0 days    Minutes of Exercise per Session: Not on file  Stress: Stress Concern Present (04/29/2024)   [employer] of Occupational Health - Occupational Stress Questionnaire    Feeling of Stress: Rather much  Social Connections: Unknown (04/28/2024)   Social Connection and Isolation Panel    Frequency of Communication with Friends and Family: Not on file    Frequency of Social Gatherings with Friends and Family: Not on file    Attends Religious Services: More than 4 times per year    Active Member of Clubs or Organizations: Yes    Attends Banker Meetings: More than 4 times per year    Marital Status: Divorced  Depression (PHQ2-9): Low Risk (08/31/2024)   Depression (PHQ2-9)    PHQ-2 Score: 1  Alcohol  Screen: Low Risk (04/28/2024)   Alcohol  Screen    Last Alcohol  Screening Score (AUDIT): 1  Housing: Unknown (08/31/2024)   Epic    Unable to Pay for Housing in the Last Year: No    Number of Times Moved in the Last Year: Not on file    Homeless in the Last Year: No  Utilities: Not At Risk (08/31/2024)   Epic    Threatened with loss of utilities: No  Health Literacy: Adequate Health Literacy (04/29/2024)   B1300 Health Literacy    Frequency of need for help with medical instructions: Never   Allergies[1] Family History  Problem Relation Age of Onset   Breast cancer Mother    Ovarian cancer Mother    Alzheimer's disease Mother     Parkinson's disease Father    Heart disease Father    Hypothyroidism Father    Hypertension Sister    Breast cancer Sister    Kidney disease Sister    Prostate cancer Brother    Heart attack Cousin    Colon cancer Neg Hx    Esophageal cancer Neg Hx    Inflammatory bowel disease Neg Hx    Liver disease Neg Hx    Pancreatic cancer Neg Hx    Rectal cancer Neg Hx    Stomach cancer Neg Hx     Current Outpatient Medications (Endocrine & Metabolic):  alendronate (FOSAMAX) 70 MG tablet, Take 1 tablet po q weekly as directed Oral   levothyroxine  (SYNTHROID ) 125 MCG tablet, Take 1 tablet (125 mcg total) by mouth daily.  Current Outpatient Medications (Cardiovascular):    amLODipine  (NORVASC ) 2.5 MG tablet, Take 3 tablets (7.5 mg total) by mouth daily.   lisinopril  (ZESTRIL ) 20 MG tablet, Take 1 tablet (20 mg total) by mouth daily.  Current Outpatient Medications (Hematological):    cyanocobalamin  1000 MCG tablet, Take 1,000 mcg by mouth daily.  Current Outpatient Medications (Other):    b complex vitamins capsule, Take 1 capsule by mouth daily.   CALCIUM PO, Take 1 tablet by mouth daily.   Cholecalciferol (VITAMIN D  PO), Take 1 tablet by mouth daily.   clindamycin (CLEOCIN T) 1 % external solution, Apply 1 application topically daily.    hydrocortisone 2.5 % cream, Apply 1 Application topically daily.   latanoprost (XALATAN) 0.005 % ophthalmic solution, Place 1 drop into both eyes at bedtime.   Magnesium 500 MG CAPS, Take 500 mg by mouth daily.   Multiple Vitamins-Minerals (PRESERVISION AREDS PO),    Omega-3 Fatty Acids (FISH OIL PO), Take 1 capsule by mouth daily.   PARoxetine (PAXIL) 10 MG tablet, Take 10 mg by mouth daily.   potassium chloride  SA (KLOR-CON  M) 20 MEQ tablet, Take 1 tablet (20 mEq total) by mouth daily.   triamcinolone  cream (KENALOG ) 0.5 %, Apply 1 Application topically 2 (two) times daily. To affected areas.   Reviewed prior external information including notes  and imaging from  primary care provider As well as notes that were available from care everywhere and other healthcare systems.  Past medical history, social, surgical and family history all reviewed in electronic medical record.  No pertanent information unless stated regarding to the chief complaint.   Review of Systems:  No headache, visual changes, nausea, vomiting, diarrhea, constipation, dizziness, abdominal pain, skin rash, fevers, chills, night sweats, weight loss, swollen lymph nodes, body aches, joint swelling, chest pain, shortness of breath, mood changes. POSITIVE muscle aches  Objective  Blood pressure 124/64, pulse 69, height 5' 1 (1.549 m), weight 123 lb (55.8 kg), SpO2 95%.   General: No apparent distress alert and oriented x3 mood and affect normal, dressed appropriately.  HEENT: Pupils equal, extraocular movements intact  Respiratory: Patient's speak in full sentences and does not appear short of breath  Cardiovascular: No lower extremity edema, non tender, no erythema  Neck exam shows significant loss of lordosis.  Weakness in the handgrip bilaterally.  Mostly in the C6 and C7 distribution. Foot exam shows patient still has weakness noted with dorsi flexion.    Impression and Recommendations:     The above documentation has been reviewed and is accurate and complete Shyra Emile M Rhegan Trunnell, DO       [1] No Known Allergies  "

## 2024-10-26 ENCOUNTER — Encounter: Payer: Self-pay | Admitting: Family Medicine

## 2024-10-26 ENCOUNTER — Ambulatory Visit: Admitting: Family Medicine

## 2024-10-26 VITALS — BP 124/64 | HR 69 | Ht 61.0 in | Wt 123.0 lb

## 2024-10-26 DIAGNOSIS — M4802 Spinal stenosis, cervical region: Secondary | ICD-10-CM

## 2024-10-26 NOTE — Patient Instructions (Addendum)
 Look for a vibrating massage ball Fingertip moistener  10 mins most days of the week Good to see you! See you again in 3 months

## 2024-10-26 NOTE — Assessment & Plan Note (Addendum)
 Discussed with patient at great length.  Patient wants to hold on the neurosurgery referral at the moment.  Discussed which activities to do and which ones to potentially avoid.  We discussed different medications with patient did not want to do as well.  Is having some weakness in the hands that I do think is contributed from the cervical radiculopathy as well.  Patient at this point feels like she can do daily activities fairly well does have some difficulty with tactile aspect.  We discussed vibratory senses and ways to potentially mitigate some of the symptoms.  Patient will follow-up with me again in 3 to 5 months.  I personally spent a total of 31 minutes in the care of the patient today including preparing to see the patient, performing a medically appropriate exam/evaluation, counseling and educating, documenting clinical information in the EHR, and communicating results.

## 2024-11-03 ENCOUNTER — Ambulatory Visit: Admitting: Nurse Practitioner

## 2025-01-23 ENCOUNTER — Ambulatory Visit: Admitting: Family Medicine

## 2025-05-02 ENCOUNTER — Ambulatory Visit

## 2025-08-30 ENCOUNTER — Inpatient Hospital Stay

## 2025-09-06 ENCOUNTER — Inpatient Hospital Stay: Admitting: Physician Assistant
